# Patient Record
Sex: Male | Born: 1975 | Race: White | Hispanic: No | Marital: Single | Smoking: Current every day smoker
Health system: Southern US, Community
[De-identification: ages and names within clinical notes are randomized; demographics above are authoritative.]

## PROBLEM LIST (undated history)

## (undated) DIAGNOSIS — F259 Schizoaffective disorder, unspecified: Secondary | ICD-10-CM

## (undated) DIAGNOSIS — IMO0002 Reserved for concepts with insufficient information to code with codable children: Secondary | ICD-10-CM

## (undated) DIAGNOSIS — J45909 Unspecified asthma, uncomplicated: Secondary | ICD-10-CM

## (undated) DIAGNOSIS — F319 Bipolar disorder, unspecified: Secondary | ICD-10-CM

## (undated) DIAGNOSIS — M549 Dorsalgia, unspecified: Secondary | ICD-10-CM

## (undated) DIAGNOSIS — F909 Attention-deficit hyperactivity disorder, unspecified type: Secondary | ICD-10-CM

## (undated) HISTORY — PX: BACK SURGERY: SHX140

## (undated) HISTORY — PX: ANKLE SURGERY: SHX546

## (undated) HISTORY — DX: Unspecified asthma, uncomplicated: J45.909

## (undated) HISTORY — DX: Reserved for concepts with insufficient information to code with codable children: IMO0002

---

## 1994-06-02 ENCOUNTER — Other Ambulatory Visit: Payer: Self-pay

## 2005-02-17 ENCOUNTER — Inpatient Hospital Stay: Admission: EM | Admit: 2005-02-17 | Attending: Surgical Critical Care | Admitting: Surgical Critical Care
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
## 2012-09-20 ENCOUNTER — Emergency Department: Admit: 2012-09-20 | Attending: Emergency Medicine | Admitting: Emergency Medicine

## 2012-09-20 DIAGNOSIS — M25561 Pain in right knee: Secondary | ICD-10-CM | POA: Insufficient documentation

## 2012-09-20 MED ORDER — ACETAMINOPHEN 300 MG-CODEINE 30 MG TABLET
1.0000 | ORAL_TABLET | Freq: Once | ORAL | Status: AC
Start: 2012-09-21 — End: 2012-09-20
  Administered 2012-09-20: 1 via ORAL
  Filled 2012-09-20: qty 1

## 2012-09-20 MED ORDER — ACETAMINOPHEN 300 MG-CODEINE 30 MG TABLET
1.0000 | ORAL_TABLET | Freq: Four times a day (QID) | ORAL | Status: AC | PRN
Start: 2012-09-20 — End: 2012-10-20

## 2012-09-20 MED ORDER — ACETAMINOPHEN 300 MG-CODEINE 30 MG TABLET
1.0000 | ORAL_TABLET | Freq: Once | ORAL | Status: AC
Start: 2012-09-20 — End: 2012-09-20
  Administered 2012-09-20: 2 via ORAL
  Filled 2012-09-20: qty 2

## 2012-09-20 MED ORDER — ACETAMINOPHEN 300 MG-CODEINE 30 MG TABLET
1.0000 | ORAL_TABLET | Freq: Once | ORAL | Status: DC
Start: 2012-09-20 — End: 2012-09-20
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
## 2012-09-20 NOTE — ED Nursing Note (Signed)
Dr. Barnes @ bedside.

## 2012-09-20 NOTE — ED Nursing Note (Signed)
Patient discharged from ED with AVS, Rx, related instructions and all belongings. Patient is in NAD, is awake/alert skin, is pink/warm/dry. Pt leaves the ED in stable condition by self; has no car or bike so will be walking.
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
## 2012-09-21 ENCOUNTER — Encounter: Payer: Self-pay | Admitting: Emergency Medicine

## 2012-09-23 ENCOUNTER — Emergency Department: Admit: 2012-09-23 | Attending: Emergency Medicine | Admitting: Emergency Medicine

## 2012-09-23 NOTE — ED Triage Note (Signed)
Pt ambulatory to triage, reports was seen here last week for same R knee pain, x ray taken, states now with increasing stabbing pain and pressure with ambulation. Now with Fevers/chills at home, +low abd pain, +n/v, LBM 48 hrs, denies stool changes. "i think i have an infection". R knee warm to touch, no erythema or swelling noted.

## 2012-09-23 NOTE — ED Nursing Note (Signed)
Pt frequently coming to triage desk. Reporting his pain is worsening and demanding pain meds. Educated on hospital policy. Pt disgruntled at wait time. Delays explained.

## 2012-09-23 NOTE — ED Nursing Note (Signed)
Pt ambulates to triage, continues to complain of pain to right knee. Reports emesis x3. Pt also reports abd pain. Pt breathing comfortably. Skin warm/dry

## 2012-09-23 NOTE — ED Nursing Note (Signed)
Ambulates to triage. Reports no change in status. Breathing comfortably, skin warm/dry

## 2012-09-23 NOTE — ED Progress Note (Signed)
Emergency Department Triage Note     Subjective: Carl Mata is a 37yr old male who presents to the ED with a chief complaint of 2 things.  R knee pain, ongoing   Ap-new, while taking codeine.     Patient's phone number confirmed -      Objective (pertinent exam findings):    BP 121/68  Pulse 81  Temp(Src) 36.7 C (98.1 F) (Oral)  Resp 16  Wt 68.04 kg (150 lb)  BMI 22.81 kg/m2  SpO2 97%     General: No acute distress, but appears to be in some pain. Walks with a limp.    Assessment: Carl Mata is a 37yr old male with knee paina nd AP.Marland Kitchen      Plan: Initial studies to evaluate this problem will include labs.    Further workup will then proceed in patient care area c/d. ?  Seen and initially evaluated by:  Ermelinda Das, MD, Attending Physician, Department of Emergency Medicine       This patient was seen by a physician in triage. Please see the ED Initial Note for full details of this patient's care. This note covers the brief initial evaluation performed to obtain initial studies to expedite the patient's care. It is not intended to be a comprehensive document of the patient's ED visit.    This patient was instructed that this preliminary assessment and any studies obtained do not constitute a full workup of the patient's chief complaint. The patient was instructed to wait in the assigned area after the triage evaluation to be reevaluated by a physician after initial studies are completed.

## 2012-09-23 NOTE — ED Nursing Note (Signed)
Ambulates to triage. Denies change in abd pain. Breathing comfortably. Skin warm/dry

## 2012-09-24 ENCOUNTER — Encounter: Payer: Self-pay | Admitting: General Practice

## 2012-09-24 DIAGNOSIS — R109 Unspecified abdominal pain: Secondary | ICD-10-CM | POA: Insufficient documentation

## 2012-09-24 MED ORDER — DOCUSATE SODIUM 100 MG CAPSULE
100.0000 mg | ORAL_CAPSULE | Freq: Two times a day (BID) | ORAL | Status: AC
Start: 2012-09-24 — End: 2012-10-24

## 2012-09-24 MED ORDER — NACL 0.9% IV BOLUS - DURATION REQ
1000.0000 mL | Freq: Once | INTRAVENOUS | Status: AC
Start: 2012-09-24 — End: 2012-09-24
  Administered 2012-09-24: 1000 mL via INTRAVENOUS

## 2012-09-24 MED ORDER — HYDROCODONE 5 MG-ACETAMINOPHEN 325 MG TABLET
1.0000 | ORAL_TABLET | Freq: Three times a day (TID) | ORAL | Status: AC | PRN
Start: 2012-09-24 — End: 2012-10-08

## 2012-09-24 MED ORDER — MAGNESIUM CITRATE ORAL SOLUTION
296.0000 mL | Freq: Once | ORAL | Status: AC
Start: 2012-09-24 — End: 2012-09-24
  Administered 2012-09-24: 296 mL via ORAL

## 2012-09-24 MED ORDER — MORPHINE 2 MG/ML INJECTION SYRINGE
4.0000 mg | INJECTION | INTRAMUSCULAR | Status: DC | PRN
Start: 2012-09-24 — End: 2012-09-24
  Administered 2012-09-24 (×2): 4 mg via INTRAVENOUS
  Filled 2012-09-24 (×2): qty 1

## 2012-09-24 MED ORDER — ONDANSETRON HCL (PF) 4 MG/2 ML INJECTION SOLUTION
4.0000 mg | Freq: Once | INTRAMUSCULAR | Status: AC
Start: 2012-09-24 — End: 2012-09-24
  Administered 2012-09-24: 4 mg via INTRAVENOUS
  Filled 2012-09-24: qty 2

## 2012-09-24 NOTE — ED Nursing Note (Signed)
Pt reports that he wants pain medication, pt states "the doctor promised me pain medication, nausea medication is not pain medication, I want pain medication" MD made aware, pt agitated

## 2012-09-24 NOTE — ED Nursing Note (Signed)
Pt given po juice and crackers verbalized understanding on instructions per MD pt given pain meds before d/c, pt ambulatory out of ED

## 2012-09-24 NOTE — ED Nursing Note (Signed)
Pt reports LLQ pain X3 days "getting worse and worse feels like im walking on glass" reports nausea and vomiting green bile, constipation since 09/20/12, bilatral leg pain and all over body pain, reports that he does not do any recreational drugs but he took unknown pills from his friends to make him feel better, pt ambulatory to room pending Md evaluation

## 2012-09-24 NOTE — ED Nursing Note (Addendum)
sts pain so bad having to wear sun glasses, describes abd pain as "scratching together on glass" - endorses nausea - gcs 15, skin tones pwd, talking full sentences - lab results reviewed

## 2012-09-24 NOTE — Discharge Instructions (Signed)
Please contact one of the below mediCal clinic to schedule appointment with primary care provider.                        [facility] Referral List  [facility] Groups, Hours 8:00 am to 5:00 pm    [employer], [address], [place], [place] [address]  [phone]    [facility], [address] [place], [place] [address]    [place] [facility], [address] [place], [place] [address] [phone]    [facility], [address], [address], [phone]    [facility], [address], [address]  [phone], [address], [address]  [phone]    [address], [address] Cindie Laroche [place], [place] [address]  [phone]    [facility], [place]. Wed. Fri. 5:00 pm to 8:00 pm, Sat & Sun 10:00am - 4:00pm    [facility], [address], [place], [place] [address]  [phone]     [address], [place], [place] [address]  [phone]    [facility], [address], [place], [place] [address]  [address], [address], [place], [place] [address]  [phone]    [facility], [address] [place] [address]  [phone]    [facility], [address] [place], [place] [address]  [phone]    [facility], [address] [place], [place] [address]  [phone]    [facility], [address], [place], [place] [address]  [phone]    Planned [address], [place], [place]  [phone]    [place], [address] [place], [place]  [phone]    [place], [address] [place], [place]  [phone]    [facility], [address], [place], [place]  [phone]    [place], [address], [place], [place]  [phone]    24 hour facts of life line  [phone]    Additional [facility], [address] [place], [place]  [phone]    [facility], [address] Kathlene Cote [place], [place]  [phone] (7 days a  week)    [employer] [address] [place], [place]  [phone] (5pm - 8pm, M-F)    [facility], [address] [place], [place]  [phone]    [facility], [address] [place] [place]  [phone]    [facility], [address] [place], [place]  [phone]    [facility], [address] [place], [place]  [phone]     [facility], [address], [place], [place]  [phone]    [facility], [address] [place], [place]  [phone]    [facility], [address] [place], [place], [place]  [phone]    Med Center, [address], [place], [place]  [phone]    [facility], [address], [place], [place]  [phone]    [facility], [address], [place], [place]  [phone]    [facility], [address] [place], [place]  [phone]    [facility], [facility], [place], [place]  [phone]    [facility], [address] [place], [place]   [phone]    [facility],  [facility] [address] [place], [place]     [place], [address] [place], [place]    [facility], [address] [place], [place], [address] [place], [place]    [place], [place] [address]  [place] [address] [place], [place]  or  [place]. [place], [place]  or  [place], [place]  [phone]  or  [place]    [place], [place]  [address]  [place], [place]  [phone]  [address] # [place], [place]  ([facility], [facility] for [employer], [address], [place], [place]    [facility], [place], [place], [place]    [facility], [place]., [place], [place]    [facility], [place], [place], [place] for [facility], [place]. [facility], [address] [place], [place], [address] [place], [place], [address]    [place], [place]. #[address] [phone]     [facility], [facility], [place], [place], [place]

## 2012-09-24 NOTE — ED Initial Note (Signed)
EMERGENCY DEPARTMENT PHYSICIAN NOTE - Carl Mata       Date of Service:   09/23/2012  1:10 PM Patient's PCP: No Pcp No Pcp   Note Started: 09/24/2012 03:03 DOB: Aug 19, 1975             Chief Complaint   Patient presents with    Abd Pain Active     with R knee pain. LLQ           The history provided by the patient and medical records.  Interpreter used: No    Carl Mata is a 37yr old male, with a past medical history significant for recent right knee sprain requiring Rx for tylenol w/ codeine just 3 days prior to presentation, who presents to the ED with a chief complaint of LLQ abdominal pain radiating to the left flank associated with urinary symptoms and N/V.  Last BM 3 days ago.  Able to tolerate liquids PO.    Quality: stabbing  Location: LLQ with radiation tot he left flank  Severity: 9-10/10 (does not correlate to exam, pt appears comfortable)  Time Course: worsening  Progression: rapidly worsening  Duration: 2 days  Palliative factors: laying down makes it better.  Provocative factors: bending, twisting, eating and standing makes it worse.  Associated symptoms: generalized aches and pain more pronounced in the lower extremities, dizziness, light sensitivity, dysuria, hesitancy, and straining for urination.  Pertinent negatives: Denies fevers, headache, or cough.    A full history, including pertinent past medical, family and social history was reviewed.    HISTORY:  There are no hospital problems to display for this patient.   Allergies   Allergen Reactions    Nsaids (Non-Steroidal Anti-Inflammatory Drug) Swelling      Past Medical History:    Unspecified mental or behavioral problem                      Unspecified asthma(493.90)                                 History reviewed. No pertinent surgical history.   Social History    Marital Status: SINGLE              Spouse Name:                       Years of Education:                 Number of children:               Occupational History    None on  file    Social History Main Topics    Smoking Status: Current Every Day Smoker        Packs/Day: 0.20  Years: 1.5       Types: Cigarettes    Smokeless Status: Not on file                       Alcohol Use: No              Drug Use: Yes                Special: Methamphetamine    Sexual Activity: Not on file          Other Topics            Concern  None on file    Social History Narrative    None on file     No family history on file.             Review of Systems   Constitutional: Positive for fatigue. Negative for fever.   HENT: Negative.    Eyes: Positive for photophobia. Negative for visual disturbance.   Respiratory: Negative.    Cardiovascular: Negative.    Gastrointestinal: Positive for nausea, vomiting, abdominal pain and constipation. Negative for diarrhea, blood in stool and abdominal distention.   Genitourinary: Positive for dysuria and flank pain. Negative for frequency and hematuria.   Musculoskeletal: Positive for myalgias and arthralgias.   Skin: Negative for rash.   Neurological: Positive for dizziness. Negative for tremors, seizures, syncope, weakness, light-headedness, numbness and headaches.   Psychiatric/Behavioral: Negative.    All other systems reviewed and are negative.        TRIAGE VITAL SIGNS:  Temp: 36.7 C (98.1 F) (09/23/12 1306)  Temp src: Oral (09/23/12 1306)  Pulse: 81  (09/23/12 1306)  BP: 121/68 mmHg (09/23/12 1306)  Resp: 16  (09/23/12 1306)  SpO2: 97 % (09/23/12 1306)  Weight: 68.04 kg (150 lb) (09/23/12 1318)    Physical Exam   Nursing note and vitals reviewed.  Constitutional: He is oriented to person, place, and time. He appears well-developed and well-nourished. No distress.   HENT:   Head: Normocephalic and atraumatic.   Eyes: Conjunctivae and EOM are normal.   Pupils dilated to 5mm, poorly reactive to light   Cardiovascular: Normal rate, regular rhythm, normal heart sounds and intact distal pulses.    No murmur heard.  Pulmonary/Chest: Effort normal and breath sounds  normal. No respiratory distress. He has no wheezes. He has no rales. He exhibits no tenderness.   Abdominal: Soft. He exhibits no distension. Bowel sounds are decreased. There is tenderness in the right lower quadrant, suprapubic area and left lower quadrant. There is CVA tenderness. There is no guarding, no tenderness at McBurney's point and negative Murphy's sign.   Point tender in left flank muscles at iliac crest with reproduction of pain.   Neurological: He is alert and oriented to person, place, and time.   Skin: Skin is warm and dry. He is not diaphoretic.   Psychiatric: He has a normal mood and affect. His behavior is normal.           INITIAL ASSESSMENT & PLAN, MEDICAL DECISION MAKING, ED COURSE:  Carl Mata is a 37yr old male who presents with a chief complaint of LLQ abdominal pain radiating to the left flank associated with urinary symptoms and N/V.   After history and exam, I feel the diagnosis is most likely abd wall pain.  Differential diagnosis includes, but is not limited to Gastroenteritis, Nephrolithiasis, s/e of amphetamine use.    The patient is hemodynamically stable and will require IV fluids as an immediate intervention.   Initial treatment and studies to evaluate this problem will include serial exams, labs, imaging, analgesia as needed and antiemetics as needed.       ED Course Update: The patient was observed in the ED. The results of the ED evaluation were notable for the following:    Pertinent lab results:   Normal CBC, BMP, INR, & CRP   Ketones and protein on UA, otherwise normal   Amphetamines and Opiates found on UDT    Pertinent imaging results:   Abdominal X-rays: Nonobstructive bowel gas pattern, with mild volume retained  fecal material throughout the colon.      Pertinent medications:    Morphine IV, given for pain control.   Mag Citrate, given for constipation.   Zofran, given for Nausea.    Interventions:    - Morphine 4mg  IV x1   - Mag Citrate PO x1   - Zofran 4mg  IV  x1    ED Course:  Serial exams revealed no progression or worsening of abdominal pain.  Abs X-rays demonstrate significant stool burden. Also the patient's presentation is also consistent with abdominal wall muscle strain.  Pain improved with pain medication.    Patient Summary:   37 yo male who is taking opiates and amphetamines presents with acute LLQ abdominal pain associated with bending/twisting movements and constipation x3 days.  Likely acute constipation vs abdominal muscle wall strain.  Pain has improved with IV pain meds.  Patient is able to tolerate PO without significant N/V.  Will d/c home with PO pain meds (Norco #20) and stool softener.  Pt instructed to f/u with [facility] PCP within the next fes weeks.      LAST VITAL SIGNS:  Temp: 36.5 C (97.7 F) (09/24/12 0252)  Temp src: Oral (09/24/12 0252)  Pulse: 70  (09/24/12 0252)  BP: 123/78 mmHg (09/24/12 0252)  Resp: 18  (09/24/12 0252)  SpO2: 98 % (09/24/12 0252)  Weight: 68.04 kg (150 lb) (09/23/12 1318)      Clinical Impression:  Abdominal Pain      Anticipated work up needed: Outpatient work-up with [facility] PCP.      Disposition: Discharge. Follow up with [facility] PCP. ED discharge instructions were reviewed and provided. It was discussed that radiology reports are preliminary and the patient will be contacted for any changes in interpretation.        MDM COMPLEXITY  Overall Complexity of MDM is: moderate        PRESENT ON ADMISSION:  Are any of the following four conditions present or suspected on admission: decubitus ulcer, infection from an intravascular device, infection due to an indwelling catheter, surgical site infection or pneumonia? No.    PATIENT'S GENERAL CONDITION:  Good: Vital signs are stable and within normal limits. Patient is conscious and comfortable. Indicators are excellent.       Report electronically signed by:  Amador Cunas, MD Intern    Report electronically signed by: Melene Plan Mickie Kay, MD  Attending Physician    This  patient was seen, evaluated, and care plan was developed with the resident.  I agree with the findings and plan as outlined in our combined note.      Romona Curls, MD

## 2012-10-05 ENCOUNTER — Emergency Department: Admit: 2012-10-05 | Attending: Emergency Medicine | Admitting: Emergency Medicine

## 2012-10-05 NOTE — ED Triage Note (Signed)
Patient reports can't take deep breaths, dizzy, productive cough x 2 weeks, right knee pain. Resp even and unlabored, lungs cta, shallow unable to take deep breaths consistently without coughing. +runny nose.

## 2012-10-05 NOTE — ED Nursing Note (Signed)
Pt ambulatory with steady gait. Pt with cont cough. Denies productive cough. Cont chronic knee pain. Denies acute changes. Pt ambulating to xray.

## 2012-10-06 ENCOUNTER — Encounter: Payer: Self-pay | Admitting: Diagnostic Radiology

## 2012-10-06 DIAGNOSIS — R059 Cough, unspecified: Secondary | ICD-10-CM | POA: Insufficient documentation

## 2012-10-06 NOTE — ED Nursing Note (Signed)
Pt refused vital signs.

## 2012-10-06 NOTE — ED Initial Note (Signed)
EMERGENCY DEPARTMENT PHYSICIAN NOTE - Carl Mata       Date of Service:   10/05/2012  7:13 PM Patient's PCP: No Pcp No Pcp   Note Started: 10/06/2012 00:59 DOB: 1975-09-06             Chief Complaint   Patient presents with    Cough       The history provided by the patient.  Interpreter used: No    Carl Mata is a 37yr old male, with a past medical history significant for, who presents to the ED with a chief complaint of non-productive cough that began two weeks ago and some mild throat pain worsened with coughing. Minimal improvement with tylenol. Nothing seems to make the symptoms worse.  He had recent congestion as well that has resolved. No fever/chills, no ear pain.    A full history, including pertinent past medical, family and social history was reviewed.    HISTORY:  There are no hospital problems to display for this patient.   Allergies   Allergen Reactions    Nsaids (Non-Steroidal Anti-Inflammatory Drug) Swelling      Past Medical History:    Unspecified mental or behavioral problem                      Unspecified asthma(493.90)                                 No past surgical history on file.   Social History    Marital Status: SINGLE              Spouse Name:                       Years of Education:                 Number of children:               Occupational History    None on file    Social History Main Topics    Smoking Status: Current Every Day Smoker        Packs/Day: 0.20  Years: 1.5       Types: Cigarettes    Smokeless Status: Not on file                       Alcohol Use: No              Drug Use: Yes                Special: Methamphetamine    Sexual Activity: Not on file          Other Topics            Concern    None on file    Social History Narrative    None on file     No family history on file.             Review of Systems   Constitutional: Negative for fever and chills.   HENT: Positive for congestion (Resolved 1 week ago) and sore throat. Negative for ear pain.    Eyes:  Negative for discharge and itching.   Respiratory: Positive for cough. Negative for chest tightness, shortness of breath and wheezing.    Gastrointestinal: Negative for nausea, vomiting, abdominal pain, diarrhea and constipation.   Musculoskeletal:  Positive for arthralgias (Pain in right knee and right ankle (history of post-traumatic surgery)).   All other systems reviewed and are negative.        TRIAGE VITAL SIGNS:  Temp: 37.9 C (100.2 F) (10/05/12 1913)  Temp src: Oral (10/05/12 1913)  Pulse: 104  (10/05/12 1913)  BP: 132/78 mmHg (10/05/12 1913)  Resp: 20  (10/05/12 1913)  SpO2: 98 % (10/05/12 1913)  Weight: (not recorded)    Physical Exam   Constitutional: He appears well-developed and well-nourished.   HENT:   Head: Normocephalic and atraumatic.   Mouth/Throat: Oropharynx is clear and moist. No oropharyngeal exudate.   Eyes: EOM are normal. Pupils are equal, round, and reactive to light. Right eye exhibits no discharge. Left eye exhibits no discharge.   Neck: Normal range of motion. Neck supple. No thyromegaly present.   Cardiovascular: Normal rate, regular rhythm, normal heart sounds and intact distal pulses.  Exam reveals no gallop and no friction rub.    No murmur heard.  Pulmonary/Chest: Effort normal and breath sounds normal. No respiratory distress. He has no wheezes. He has no rales.   Abdominal: Soft. Bowel sounds are normal. He exhibits no distension. There is no tenderness.   Musculoskeletal: Normal range of motion.   Post surgical scar overlying anterior and posterior right knee and medial right ankle.  Inconsistent endorsement of pain of right knee and ankle joint with flexion/extension.   Lymphadenopathy:     He has no cervical adenopathy.   Skin: Skin is warm and dry. No rash noted. No erythema.       INITIAL ASSESSMENT & PLAN, MEDICAL DECISION MAKING, ED COURSE:  Carl Mata is a 37yr old male who presents with a chief complaint of cough. After history and exam, I feel the diagnosis is most  likely post-viral reactive cough. Pneumonia is unlikely without systemic symptoms, benign pulmonary exam and normal chest radiograph.  Differential diagnosis also includes, but is not limited to seasonal allergies and GERD.  The patient is hemodynamically stable and will require no immediate intervention. Initial treatment and studies to evaluate this problem will include analgesia as needed.        Radiology - I reviewed and interpreted the images independently  CXR - no infiltrate, ptx      ED Course: Patient's chest x-ray did not display findings concerning of pneumonia or other acute process.    Patient Summary: 37 yo male with cough x 2 weeks in setting of recent URI.  This likely represents a resolving post viral cough.  Supportive care discussed for home - fluids, rest, OTC antitussives as needed.      LAST VITAL SIGNS:  Temp: 36.7 C (98.1 F) (10/05/12 2148)  Temp src: Oral (10/05/12 2148)  Pulse: 91  (10/05/12 2148)  BP: 113/61 mmHg (10/05/12 2148)  Resp: 20  (10/05/12 2148)  SpO2: 98 % (10/05/12 2148)  Weight: (not recorded)      Clinical Impression: Post-viral reactive cough      Anticipated work up needed: none      Disposition: Discharge. Follow up with PCP. ED discharge instructions were reviewed and provided. It was discussed that radiology reports are preliminary and the patient will be contacted for any changes in interpretation.        MDM COMPLEXITY  Overall Complexity of MDM is: low          PRESENT ON ADMISSION:  Are any of the following four conditions present or suspected on admission: decubitus ulcer,  infection from an intravascular device, infection due to an indwelling catheter, surgical site infection or pneumonia? No.    PATIENT'S GENERAL CONDITION:  Good: Vital signs are stable and within normal limits. Patient is conscious and comfortable. Indicators are excellent.       Report electronically signed by:  Janit Pagan, MD Intern      The patient was seen and examined with the intern  after we discussed patient management and key exam findings.  I performed a complete history and independent exam on the patient.  The note above reflects my medical decision making.      Electronically signed:    Ina Kick, MD, [facility]  Attending Physician, Department of Emergency Medicine  PI [phone]

## 2012-10-31 ENCOUNTER — Other Ambulatory Visit: Payer: Self-pay

## 2012-10-31 ENCOUNTER — Emergency Department: Admit: 2012-10-31 | Payer: MEDICAID

## 2012-10-31 DIAGNOSIS — IMO0002 Reserved for concepts with insufficient information to code with codable children: Secondary | ICD-10-CM | POA: Insufficient documentation

## 2012-10-31 DIAGNOSIS — T148XXA Other injury of unspecified body region, initial encounter: Secondary | ICD-10-CM | POA: Insufficient documentation

## 2012-10-31 MED ORDER — HYDROCODONE 5 MG-ACETAMINOPHEN 325 MG TABLET
1.0000 | ORAL_TABLET | Freq: Four times a day (QID) | ORAL | 0 refills | Status: AC | PRN
Start: 2012-10-31 — End: 2012-11-07
  Filled 2012-10-31 (×2): qty 20, 5d supply, fill #0

## 2012-10-31 MED ORDER — MORPHINE 2 MG/ML INJECTION SYRINGE
4.0000 mg | INJECTION | Freq: Once | INTRAMUSCULAR | Status: DC
Start: 2012-10-31 — End: 2012-10-31

## 2012-10-31 MED ORDER — HYDROCODONE 5 MG-ACETAMINOPHEN 325 MG TABLET
1.0000 | ORAL_TABLET | Freq: Once | ORAL | Status: AC
Start: 2012-10-31 — End: 2012-10-31
  Administered 2012-10-31: 2 via ORAL
  Filled 2012-10-31: qty 2

## 2012-10-31 MED ORDER — MORPHINE 2 MG/ML INJECTION SYRINGE
4.0000 mg | INJECTION | INTRAMUSCULAR | Status: DC | PRN
Start: 2012-10-31 — End: 2012-10-31
  Administered 2012-10-31 (×2): 4 mg via INTRAVENOUS
  Filled 2012-10-31 (×2): qty 1
  Filled 2012-10-31: qty 2

## 2012-10-31 MED ORDER — NACL 0.9% IV BOLUS - DURATION REQ
1000.0000 mL | Freq: Once | INTRAVENOUS | Status: AC
Start: 2012-10-31 — End: 2012-10-31
  Administered 2012-10-31: 1000 mL via INTRAVENOUS

## 2012-10-31 NOTE — ED Nursing Note (Signed)
Assumed care of pt.   Reports 7 foot fall of ladder.   No loc.   See trauma survey for assessment details.  Awake and alert, skin warm and dry.  To xray

## 2012-10-31 NOTE — ED Initial Note (Signed)
EMERGENCY DEPARTMENT PHYSICIAN NOTE - JOHNPATRICK JENNY       Date of Service:   10/31/2012 12:27 PM Patient's PCP: No Pcp No Pcp   Note Started: 10/31/2012 13:04 DOB: Dec 13, 1975             Chief Complaint   Patient presents with    *933:Blunt/Critical Trauma Level III           The history provided by the patient.  Interpreter used: No    TEMESGEN WEIGHTMAN is a 37yr old male, with a past medical history significant for asthma, who presents to the ED s/p fall from ladder today.  Patient states he fell 7 ft, landed on back.  C/o neck, back, right abdomen and right knee pain.  No LOC.  Denies SOB or severe HA.    A full history, including pertinent past medical, family and social history was reviewed.    HISTORY:  There are no hospital problems to display for this patient.   Allergies   Allergen Reactions    Nsaids (Non-Steroidal Anti-Inflammatory Drug) Swelling      Past Medical History:    Unspecified mental or behavioral problem                      Unspecified asthma(493.90)                                 No past surgical history on file.   Social History    Marital Status: MARRIED             Spouse Name:                       Years of Education:                 Number of children:               Occupational History    None on file    Social History Main Topics    Smoking Status: Current Every Day Smoker        Packs/Day: 0.20  Years: 1.5       Types: Cigarettes    Smokeless Status: Not on file                       Alcohol Use: No              Drug Use: Yes                Special: Methamphetamine    Sexual Activity: Not on file          Other Topics            Concern    None on file    Social History Narrative    None on file     No family history on file.             Review of Systems   Constitutional: Negative for diaphoresis.   HENT: Positive for neck pain.    Respiratory: Negative for shortness of breath.    Cardiovascular: Positive for chest pain.   Gastrointestinal: Positive for abdominal pain. Negative for  nausea, vomiting and diarrhea.   Genitourinary: Negative for flank pain.   Musculoskeletal: Positive for back pain and arthralgias.   Skin: Negative for color change and pallor.   Neurological:  Negative for headaches.   Psychiatric/Behavioral: Negative for confusion.   All other systems reviewed and are negative.        TRIAGE VITAL SIGNS:  Temp: 36.1 C (97 F) (10/31/12 1229)  Temp src: Oral (10/31/12 1229)  Pulse: 91  (10/31/12 1229)  BP: 131/80 mmHg (10/31/12 1229)  Resp: 16  (10/31/12 1229)  SpO2: 98 % (10/31/12 1229)  Weight: 68.04 kg (150 lb) (10/31/12 1231)    Physical Exam   Nursing note and vitals reviewed.  Constitutional: He is oriented to person, place, and time. He appears well-developed and well-nourished. No distress.   HENT:   Head: Normocephalic and atraumatic.   Right Ear: External ear normal.   Left Ear: External ear normal.   Nose: Nose normal.   Mouth/Throat: Oropharynx is clear and moist.   Eyes: Conjunctivae and EOM are normal. Pupils are equal, round, and reactive to light.   Neck:   C-collar in place. +c-spine TTP. No stepoffs.     Cardiovascular: Normal rate, regular rhythm, normal heart sounds and intact distal pulses.  Exam reveals no gallop and no friction rub.    No murmur heard.  Pulmonary/Chest: Effort normal and breath sounds normal. No respiratory distress. He has no wheezes. He has no rales. He exhibits tenderness (anterior CW TTP).   Abdominal: Soft. He exhibits no distension. There is tenderness (right-sided TTP). There is no rebound and no guarding.   Musculoskeletal:   Pelvis stable. +T/L spine TTP. No stepoffs. Good gluteal squeeze.  Right medial knee TTP, able to flex to 90 degrees.  Distal sensation, strength and pulses intact       Neurological: He is alert and oriented to person, place, and time. No cranial nerve deficit. He exhibits normal muscle tone. Coordination normal.   Skin: Skin is warm and dry. He is not diaphoretic. No erythema. No pallor.   Psychiatric: He has  a normal mood and affect. His behavior is normal.           INITIAL ASSESSMENT & PLAN, MEDICAL DECISION MAKING, ED COURSE:  KAIRE STARY is a 37yr old male who presents with a chief complaint of fall.  Differential diagnosis includes, but is not limited to fractures, contusion, IAI, ICH.  The patient is hemodynamically stable and will require IVF as an immediate intervention. Initial treatment and studies to evaluate this problem will include serial exams, labs, imaging and analgesia as needed.         ED Course Update: The patient was observed in the ED. The results of the ED evaluation were notable for the following:    Pertinent lab results (reviewed and interpreted independently by me): normal CBC, chem7, LFTs, lipase    Pertinent imaging results (reviewed and interpreted independently by me):   CT C-spine: no fracture or dislocation  CT A/P: no acute process  CT L-spine: no acute process  CXR: no acute process  T-spine xray: no fracture or dislocation  Right knee xray: no fracture or dislocation    Pertinent medications: morphine, given for pain.    Chart Review: I reviewed the patient's prior medical records. Pertinent information that is relevant to this encounter h/o asthma.          Further MDM  Additional data reviewed? N/A    Patient Summary: 37 year-old man presenting to ED s/p fall today.  Patient remains afebrile and hemodynamically stable in ED.  Lab studies unremarkable.  Xrays and CT scans of neck, back, right knee and abdomen  without acute process.  Patient given IV analgesia with improved pain.  Patient able to ambulate and tolerate PO in ED.  Plan to discharge home with rx for norco.  Discussed return precautions, including but not limited to chest pain, shortness of breath, vomiting, fevers, worsening pain.  Patient verbalizes understanding and agrees to follow-up with primary care doctor in 1-2 days.            LAST VITAL SIGNS:  Temp: 36.1 C (97 F) (10/31/12 1229)  Temp src: Oral (10/31/12  1229)  Pulse: 88  (10/31/12 1427)  BP: 110/65 mmHg (10/31/12 1427)  Resp: 20  (10/31/12 1427)  SpO2: 97 % (10/31/12 1427)  Weight: 68.04 kg (150 lb) (10/31/12 1231)      Clinical Impression:   Fall  Contusions   Back sprain      Anticipated work up needed: f/u with PCP      Disposition: Discharge. Follow up with PCP. ED discharge instructions were reviewed and provided. It was discussed that radiology reports are preliminary and the patient will be contacted for any changes in interpretation.        MDM COMPLEXITY  Overall Complexity of MDM is: high          PRESENT ON ADMISSION:  Are any of the following four conditions present or suspected on admission: decubitus ulcer, infection from an intravascular device, infection due to an indwelling catheter, surgical site infection or pneumonia? No.    PATIENT'S GENERAL CONDITION:  Fair: Vital signs are stable and within normal limits. Patient is conscious but may be uncomfortable. Indicators are favorable.      Report electronically signed by:  Margie Ege, MD Resident      This patient was seen, evaluated, and care plan was developed with the resident.  I agree with the findings and plan as outlined in our combined note.        Kandice Moos, MD  Assistant Professor of Emergency Medicine  Department of Emergency Medicine  Division of Medical Toxicology  Pager: [phone]

## 2012-10-31 NOTE — ED Nursing Note (Signed)
DC instructions given by resident.   Pt A and O times four, skin PWD, positive csm times four, ambulates with steady gait.  Takes po with no n/v.  DC home with family

## 2012-10-31 NOTE — Consults (Signed)
933 trauma code activation   Patient seen and evaluated by the trauma service within one hour of presentation. We will follow the patient's ED course closely.  When appropriate, we will be ready to admit the patient. Thank you.     Laural Golden ACNP  [phone]  10/31/2012  12:41

## 2012-10-31 NOTE — ED Triage Note (Signed)
Pt aox4. Reports on ladder, fell off apprx 46ft. Landed on back. -loc. +cspine/tspine pain. Reports tingling to ble. Pt reports ruq abd pain with palpation. Denies cp/sob. Skin warm/dry.

## 2012-11-02 MED FILL — iohexoL 350 mg iodine/mL intravenous solution: INTRAVENOUS | Qty: 125 | Status: AC

## 2012-11-28 ENCOUNTER — Emergency Department: Admit: 2012-11-28 | Payer: MEDICAID | Attending: Emergency Medicine | Admitting: Emergency Medicine

## 2012-11-28 DIAGNOSIS — S5000XA Contusion of unspecified elbow, initial encounter: Secondary | ICD-10-CM | POA: Insufficient documentation

## 2012-11-28 MED ORDER — HYDROCODONE 10 MG-ACETAMINOPHEN 325 MG TABLET
1.0000 | ORAL_TABLET | Freq: Once | ORAL | Status: AC
Start: 2012-11-28 — End: 2012-11-28
  Administered 2012-11-28: 1 via ORAL
  Filled 2012-11-28: qty 1

## 2012-11-28 NOTE — ED Initial Note (Signed)
EMERGENCY DEPARTMENT PHYSICIAN NOTE - Carl Mata       Date of Service:   11/28/2012  2:48 PM Patient's PCP: No Pcp No Pcp   Note Started: 11/28/2012 16:53 DOB: [birth year]             Chief Complaint   Patient presents with    Elbow Injury     engine fell onto R elbow at approx 2pm, decreased ROM           The history provided by the patient.  Interpreter used: No    Carl Mata is a LHD 37yr old male, with no significant past medical history, who presents to the ED with a chief complaint of RUE pain that began at 14:00 this pm. Patient states he was carrying an engine when it fell on his RUE. Patient states it was immediately swollen but it has reduced. Endorses tingling to fingers of R-hand. Denies any other physical complaint at this time.    Quality: Aching  Location: R-elbow  Severity: 7/10  Time Course: constant  Progression: unchanged  Duration: since 14:00 this pm  Palliative factors: nothing makes it better.  Provocative factors: movement and touch makes it worse.    A full history, including pertinent past medical, family and social history was reviewed.    HISTORY:  There are no hospital problems to display for this patient.   Allergies   Allergen Reactions    Nsaids (Non-Steroidal Anti-Inflammatory Drug) Swelling      Past Medical History:    Unspecified mental or behavioral problem                      Unspecified asthma(493.90)                                 No past surgical history on file.   Social History    Marital Status: SINGLE              Spouse Name:                       Years of Education:                 Number of children:               Occupational History    None on file    Social History Main Topics    Smoking Status: Current Every Day Smoker        Packs/Day: 0.20  Years: 1.5       Types: Cigarettes    Smokeless Status: Not on file                       Alcohol Use: No              Drug Use: Yes                Special: Methamphetamine    Sexual Activity: Not on file           Other Topics            Concern    None on file    Social History Narrative    None on file     No family history on file.             Review of Systems  Musculoskeletal: Positive for arthralgias.   All other systems reviewed and are negative.        TRIAGE VITAL SIGNS:  Temp: 36.6 C (97.9 F) (11/28/12 1448)  Temp src: Oral (11/28/12 1448)  Pulse: 80  (11/28/12 1448)  BP: 125/66 mmHg (11/28/12 1448)  Resp: 18  (11/28/12 1448)  SpO2: 98 % (11/28/12 1448)  Weight: (not recorded)    Physical Exam   Nursing note and vitals reviewed.  Constitutional: He is oriented to person, place, and time. He appears well-developed and well-nourished. No distress.   HENT:   Head: Normocephalic and atraumatic.   Cardiovascular: Normal rate, regular rhythm and normal heart sounds.  Exam reveals no gallop and no friction rub.    No murmur heard.  Pulmonary/Chest: Effort normal and breath sounds normal. No respiratory distress. He has no wheezes. He has no rales.   Musculoskeletal:   RUE:  No bony deformity or TTP of R-shoulder.  TTP diffusley throughout R-elbow without bony deformity or swelling. Good supination and pronation.  No pain with palpation of R-forearm or wrist.  2+ radial pulse.  Median, radial and ulnar nerves intact.  Motor and sensation are    Neurological: He is alert and oriented to person, place, and time. No cranial nerve deficit.   Skin: Skin is warm and dry. He is not diaphoretic.   Psychiatric: He has a normal mood and affect. His behavior is normal.     INITIAL ASSESSMENT & PLAN, MEDICAL DECISION MAKING, ED COURSE:  Carl Mata is a 37yr old male who presents with a chief complaint of RUE. After history and exam, I feel the diagnosis is most likely contusion. Differential diagnosis includes, but is not limited to fx/dislocation.  The patient is hemodynamically stable and will require imaging and pain control as immediate interventions. Initial treatment and studies to evaluate this problem will include  serial exams, imaging and analgesia as needed.         ED Course Update: The patient was observed in the ED. The results of the ED evaluation were notable for the following:    Pertinent imaging results (reviewed and interpreted independently by me):   ELBOW 3+ VIEWS, RIGHT: negative for fracture or dislocation    Pertinent medications: Norco 10 PO, given for pain.    Further MDM  Additional data reviewed? N/A    ED Course:   16:52 Patient was seen and evaluated.  17:16 Patient was given Norco 10 PO for pain.     Patient Summary: Carl Mata is a 64yr male with no significant past medical history, who presents to the ED with the following problem list: right elbow pain after dropping a car engine on it this afternoon.  My physical examination of the patient was notable for:  Full range of motion of the right shoulder, elbow, wrist (with pain during range of motion of the elbow), no neurovascular deficit, no appreciable swelling or bony deformity.  Based on this presentation, I have considered the following differential diagnosis:  Right elbow contusion, versus occult fracture or dislocation.  After considering the patient's history, physical exam, and workup performed in the ED for this visit, I believe this presentation to be most consistent with contusion.  With regards to the other diagnoses considered, I have a low suspicion for  fracture or dislocation based on the patient's exam and unremarkable imaging.                LAST VITAL SIGNS:  Temp:  36.6 C (97.9 F) (11/28/12 1448)  Temp src: Oral (11/28/12 1448)  Pulse: 80  (11/28/12 1448)  BP: 125/66 mmHg (11/28/12 1448)  Resp: 18  (11/28/12 1448)  SpO2: 98 % (11/28/12 1448)  Weight: (not recorded)      Clinical Impression: Right elbow contusion      Anticipated work up needed: none      Disposition: Discharge. Follow up with PCP. ED discharge instructions were reviewed and provided. It was discussed that radiology reports are preliminary and the patient will  be contacted for any changes in interpretation.        MDM COMPLEXITY  Overall Complexity of MDM is: low          PRESENT ON ADMISSION:  Are any of the following four conditions present or suspected on admission: decubitus ulcer, infection from an intravascular device, infection due to an indwelling catheter, surgical site infection or pneumonia? No.    PATIENT'S GENERAL CONDITION:  Fair: Vital signs are stable and within normal limits. Patient is conscious but may be uncomfortable. Indicators are favorable.    DISCLAIMER:   This document serves as my personal record of services taken in my presence. It was created on 11/28/2012 on my behalf by Kelli Churn, a trained medical scribe.     I have reviewed this document and agree that this note accurately reflects the history and exam findings, the patient care provided, and my medical decision making.    11/28/2012  Electronically Signed By: William Hamburger, MD  Attending Physician, Department of Emergency Medicine  [facility]

## 2012-11-28 NOTE — ED Nursing Note (Signed)
Pt upset, DC'd home, unable to obtain last VS, AAOx4, no PIV, escorted to DC window

## 2012-11-28 NOTE — Discharge Instructions (Signed)
[facility] (Pregnancy and Infants), [phone]    [facility]  Health,vision,dental,BH for undocumented Children  [phone]    [facility], [address]  [phone] / [phone] 8am-5pm    Mon - Fri    [facility], Screen for uninsured Children [phone]    [facility], [address] [phone]    [facility], [address]  [phone] 8am-5pm Mon-Th, 9:45am-5pm Fri  [phone]    [facility], [address]  [phone]  7:45am - 5pm Mon - Fri, NO Medi Cal    Health Programs for the Uninsured  Medi Cal and Healthy Families, [phone]    Major Risk Medical Insurance Program  Preexisting condition and denied coverage  [phone]    [facility], [address]  [phone] / [phone], 8am - 5pm Mon - Fri  No Primary Care, Family Planning and STD    [facility]  (Children's primary care)  [address]  am - 5pm Mon - Fri, 9am - 12noon Sat    [facility], [address]  [address], [phone], 8am - 9pm    [facility], [address]  [phone], 8am - 5pm  Mon - Fri

## 2012-11-28 NOTE — ED Triage Note (Signed)
R elbow pain s/p car engine falling onto R elbow. Decreased ROM due to pain, slight swelling. VSS. +pulses, able to wiggle fingers, reports some tingling to fingers.

## 2012-12-06 ENCOUNTER — Encounter: Payer: Self-pay | Admitting: Emergency Medicine

## 2012-12-06 ENCOUNTER — Inpatient Hospital Stay
Admission: EM | Admit: 2012-12-06 | Discharge: 2012-12-08 | DRG: 552 | Disposition: A | Discharge status: Home / Self Care | Payer: MEDICAID | Attending: Vascular Surgery | Admitting: Vascular Surgery

## 2012-12-06 DIAGNOSIS — W11XXXA Fall on and from ladder, initial encounter: Secondary | ICD-10-CM | POA: Diagnosis present

## 2012-12-06 DIAGNOSIS — F17200 Nicotine dependence, unspecified, uncomplicated: Secondary | ICD-10-CM | POA: Diagnosis present

## 2012-12-06 DIAGNOSIS — Z888 Allergy status to other drugs, medicaments and biological substances status: Secondary | ICD-10-CM | POA: Insufficient documentation

## 2012-12-06 DIAGNOSIS — S5000XA Contusion of unspecified elbow, initial encounter: Secondary | ICD-10-CM | POA: Diagnosis present

## 2012-12-06 DIAGNOSIS — F319 Bipolar disorder, unspecified: Secondary | ICD-10-CM | POA: Diagnosis present

## 2012-12-06 DIAGNOSIS — Y92009 Unspecified place in unspecified non-institutional (private) residence as the place of occurrence of the external cause: Secondary | ICD-10-CM | POA: Insufficient documentation

## 2012-12-06 DIAGNOSIS — Y93H9 Activity, other involving exterior property and land maintenance, building and construction: Secondary | ICD-10-CM | POA: Insufficient documentation

## 2012-12-06 DIAGNOSIS — S22009A Unspecified fracture of unspecified thoracic vertebra, initial encounter for closed fracture: Principal | ICD-10-CM | POA: Diagnosis present

## 2012-12-06 DIAGNOSIS — J45909 Unspecified asthma, uncomplicated: Secondary | ICD-10-CM | POA: Diagnosis present

## 2012-12-06 MED ORDER — FENTANYL (PF) 50 MCG/ML INJECTION SOLUTION
INTRAMUSCULAR | Status: AC
Start: 2012-12-06 — End: 2012-12-06
  Filled 2012-12-06: qty 2

## 2012-12-06 MED ORDER — FENTANYL (PF) 50 MCG/ML INJECTION SOLUTION
50.0000 ug | INTRAMUSCULAR | Status: DC | PRN
Start: 2012-12-06 — End: 2012-12-07
  Administered 2012-12-06 (×2): 50 ug via INTRAVENOUS
  Filled 2012-12-06 (×2): qty 2

## 2012-12-06 MED ORDER — FENTANYL (PF) 50 MCG/ML INJECTION SOLUTION
75.0000 ug | Freq: Once | INTRAMUSCULAR | Status: AC
Start: 2012-12-06 — End: 2012-12-06
  Administered 2012-12-06: 75 ug via INTRAVENOUS

## 2012-12-06 NOTE — ED Initial Note (Signed)
EMERGENCY DEPARTMENT PHYSICIAN NOTE - WRAY GOEHRING       Date of Service:   12/06/2012  8:55 PM Patient's PCP: No Pcp No Pcp   Note Started: 12/06/2012 21:27 DOB: 10-29-1975             Chief Complaint   Patient presents with    Back Pain Acute     The history provided by the patient.  Interpreter used: No    SANFORD LINDBLAD is a 37yr old male, with a past medical history significant for asthma, who presents to the ED with a chief complaint of lumbar pain that began 20 minutes ago.  Patient states he was on a latter lifting a a/c unit, board he was standing on gave way and he fell directly down on to his bottom.  He did not hit is head, have LOC.  The a/c did not strike him.  Denies C or T spine pain.  Denies shortness of breath, chest pain or abdominal pain.       A full history, including pertinent past medical, family and social history was reviewed.    HISTORY:  There are no hospital problems to display for this patient.   Allergies   Allergen Reactions    Nsaids (Non-Steroidal Anti-Inflammatory Drug) Swelling      Past Medical History:    Unspecified mental or behavioral problem                      Unspecified asthma(493.90)                                 History reviewed. No pertinent surgical history.   Social History    Marital Status: SINGLE              Spouse Name:                       Years of Education:                 Number of children:               Occupational History    None on file    Social History Main Topics    Smoking Status: Current Every Day Smoker        Packs/Day: 0.20  Years: 1.5       Types: Cigarettes    Smokeless Status: Not on file                       Alcohol Use: No              Drug Use: Yes                Special: Methamphetamine    Sexual Activity: Not on file          Other Topics            Concern    None on file    Social History Narrative    None on file     No family history on file.       Review of Systems   Constitutional: Negative.    HENT: Negative.     Respiratory: Negative.    Cardiovascular: Negative.    Gastrointestinal: Negative.    Genitourinary: Negative.    Musculoskeletal: Positive for back pain.   Skin:  Negative.    Neurological: Negative.    Psychiatric/Behavioral: Negative.    All other systems reviewed and are negative.        TRIAGE VITAL SIGNS:  Temp: 36.4 C (97.5 F) (12/06/12 2047)  Temp src: Oral (12/06/12 2047)  Pulse: 86 (12/06/12 2047)  BP: 125/82 mmHg (12/06/12 2047)  Resp: 20 (12/06/12 2047)  SpO2: 95 % (12/06/12 2047)  Weight: 68.493 kg (151 lb) (12/06/12 2047)    Physical Exam   Nursing note and vitals reviewed.  Constitutional: He is oriented to person, place, and time. He appears well-developed and well-nourished. No distress.   HENT:   Head: Normocephalic and atraumatic.   No hemotympanum  No racoon's eyes  No battle signs     Eyes: Conjunctivae and EOM are normal. Pupils are equal, round, and reactive to light.   Neck: Normal range of motion. Neck supple.   Cardiovascular: Normal rate, normal heart sounds and intact distal pulses.  Exam reveals no gallop and no friction rub.    No murmur heard.  Pulmonary/Chest: Effort normal and breath sounds normal. No stridor. No respiratory distress. He has no wheezes.   Abdominal: Soft. Bowel sounds are normal. He exhibits no distension. There is no tenderness.   Musculoskeletal: Normal range of motion. He exhibits tenderness. He exhibits no edema.   No C or T spine tenderness or step offs  + midline high and low L spine tenderness, no step offs  No pelvic pain  No internal/external hip pain   Neurological: He is alert and oriented to person, place, and time. He displays normal reflexes. No cranial nerve deficit. He exhibits normal muscle tone. Coordination normal.   Skin: Skin is warm and dry. No rash noted. He is not diaphoretic. No erythema. No pallor.       INITIAL ASSESSMENT & PLAN, MEDICAL DECISION MAKING, ED COURSE:  MARC LEICHTER is a 37yr old male who presents with a chief complaint of  Lumbar spine pain. After history and exam, I feel the diagnosis is most likely L spine compression fracture. Differential diagnosis includes, but is not limited to contusion.  The patient is hemodynamically stable and will require exam as an immediate intervention. Initial treatment and studies to evaluate this problem will include serial exams, labs, imaging, analgesia as needed and antiemetics as needed.     ED Course Update: The patient was observed in the ED. The results of the ED evaluation were notable for the following:    Pertinent lab results (reviewed and interpreted independently by me):   Labs Reviewed   BASIC METABOLIC PANEL - Abnormal; Notable for the following:     UREA NITROGEN, BLOOD (BUN) 23 (*)     All other components within normal limits   CBC AUTO + REFLEX MANUAL DIFF   URINALYSIS-COMPLETE       Pertinent imaging results (reviewed and interpreted independently by me):   L-SPINE 4+ VIEWS -- T12 compression fx (new compared to May 2014)  PELVIS 1 OR 2 VIEWS -- no fx or dislocations    Pertinent medications: fentanyl, given for pain.    Chart Review: I reviewed the patient's prior medical records. Pertinent information that is relevant to this encounter Fall one month ago with similar story.    LAST VITAL SIGNS:  Temp: 36.5 C (97.7 F) (12/06/12 2322)  Temp src: Oral (12/06/12 2322)  Pulse: 52 (12/07/12 0526)  BP: 107/71 mmHg (12/07/12 0526)  Resp: 19 (12/07/12 0526)  SpO2: 99 % (12/07/12 0526)  Weight: 68.493 kg (151 lb) (12/06/12 2047)    Clinical Impression: Trauma - fall. Contusion to back. T12 fracture.    Disposition: Admit. Anticipate the patient will require greater than 2 nights of admission due to fx.    MDM COMPLEXITY  Overall Complexity of MDM is: moderate    The patient consented to treatment and was fastened to a cardiac monitor. Intravenous line was established. Cardiac rhythm and pulse oximetry was monitored. The patient's EMR was thoroughly reviewed. Throughout the patient's  emergency department course, repeated bedside assessments were made (focused exams and repeat histories), vital signs monitored , labs and imaging reviewed, assessed for response to therapy assessed, and management was discussed with ED housestaff, consultants, and nursing. The findings of the diagnostic studies performed during this visit and treatment plans were discussed in detail with the patient or their designated representative whenever possible.     Serious spinal fracture - Admission and Neurosurgical consultation.    PRESENT ON ADMISSION:  Are any of the following four conditions present or suspected on admission: decubitus ulcer, infection from an intravascular device, infection due to an indwelling catheter, surgical site infection or pneumonia? No.    PATIENT'S GENERAL CONDITION:  Fair: Vital signs are stable and within normal limits. Patient is conscious but may be uncomfortable. Indicators are favorable.    Report electronically signed by:  Maree Erie, MD Resident    This patient was seen, evaluated, and care plan was developed with the resident.  I agree with the findings and plan as outlined in our combined note.      Leatrice Jewels, MD    Report electronically signed by: Leatrice Jewels, MD, Attending.

## 2012-12-06 NOTE — ED Nursing Note (Signed)
c-spine cleared clinically by Dr. Orene Desanctis

## 2012-12-06 NOTE — ED Triage Note (Signed)
Pt. BIBA from home - pt. Fall from ladder 5-7 feet to grassy ground area onto butt - pt. Denies LOC or head trauma - pt. Reports 10/10 right lower back pains and denies numbness or tingling to BLE - pt. +CSM X4 extremities and moves X4 extremities without issues - pt. Non-amb on scene - pt. abd soft and non-TTP and chest non-TTP - breath sounds clear bilaterally - pt. In full spine precautions with c-collar in place - pt. To hallway -

## 2012-12-06 NOTE — ED Nursing Note (Signed)
Dr. Eulah Pont removed backboard, c-collar remains in place. Pt has tenderness to low L-spine, no step- off, pelvic is stable. Lungs cta, No chest wall tenderness, abd non tender. Pt report fixing aircondition unit while on ladder when he fell directly on his tailbone. Denies loc. Denies numbness or tingling to lower extremities. V.O from Dr. Eulah Pont to give 75 mcg of fentaly via IV. Pt is AOx4 and talking in complete sentences.

## 2012-12-07 ENCOUNTER — Emergency Department (HOSPITAL_COMMUNITY): Payer: MEDICAID

## 2012-12-07 ENCOUNTER — Inpatient Hospital Stay (HOSPITAL_COMMUNITY): Payer: MEDICAID

## 2012-12-07 DIAGNOSIS — S22080A Wedge compression fracture of T11-T12 vertebra, initial encounter for closed fracture: Secondary | ICD-10-CM | POA: Insufficient documentation

## 2012-12-07 DIAGNOSIS — W19XXXA Unspecified fall, initial encounter: Secondary | ICD-10-CM | POA: Insufficient documentation

## 2012-12-07 MED ORDER — DOCUSATE SODIUM 100 MG CAPSULE
100.0000 mg | ORAL_CAPSULE | Freq: Two times a day (BID) | ORAL | Status: DC
Start: 2012-12-07 — End: 2012-12-08
  Administered 2012-12-07 – 2012-12-08 (×2): 100 mg via ORAL
  Filled 2012-12-07 (×2): qty 1

## 2012-12-07 MED ORDER — HYDROCODONE 10 MG-ACETAMINOPHEN 325 MG TABLET
1.0000 | ORAL_TABLET | ORAL | Status: DC | PRN
Start: 2012-12-07 — End: 2012-12-08

## 2012-12-07 MED ORDER — DIAZEPAM 5 MG/ML INJECTION SYRINGE
5.0000 mg | INJECTION | Freq: Once | INTRAMUSCULAR | Status: AC
Start: 2012-12-07 — End: 2012-12-07
  Administered 2012-12-07: 5 mg via INTRAVENOUS
  Filled 2012-12-07: qty 2

## 2012-12-07 MED ORDER — MORPHINE 2 MG/ML INJECTION SYRINGE
1.0000 mg | INJECTION | INTRAMUSCULAR | Status: DC | PRN
Start: 2012-12-07 — End: 2012-12-07
  Administered 2012-12-07 (×2): 4 mg via INTRAVENOUS
  Filled 2012-12-07 (×2): qty 1

## 2012-12-07 MED ORDER — MORPHINE 2 MG/ML INJECTION SYRINGE
2.0000 mg | INJECTION | Freq: Four times a day (QID) | INTRAMUSCULAR | Status: DC | PRN
Start: 2012-12-07 — End: 2012-12-07

## 2012-12-07 MED ORDER — ONDANSETRON HCL (PF) 4 MG/2 ML INJECTION SOLUTION
4.0000 mg | Freq: Two times a day (BID) | INTRAMUSCULAR | Status: DC | PRN
Start: 2012-12-07 — End: 2012-12-08

## 2012-12-07 MED ORDER — HYDROMORPHONE 1 MG/ML INJECTION SYRINGE
2.0000 mg | INJECTION | INTRAMUSCULAR | Status: DC | PRN
Start: 2012-12-07 — End: 2012-12-07
  Administered 2012-12-07 (×2): 1 mg via INTRAVENOUS
  Administered 2012-12-07 (×3): 2 mg via INTRAVENOUS
  Administered 2012-12-07: 1 mg via INTRAVENOUS
  Filled 2012-12-07 (×7): qty 1

## 2012-12-07 MED ORDER — DIPHENHYDRAMINE 50 MG/ML INJECTION SOLUTION
12.5000 mg | Freq: Four times a day (QID) | INTRAMUSCULAR | Status: DC | PRN
Start: 2012-12-07 — End: 2012-12-08

## 2012-12-07 MED ORDER — CYCLOBENZAPRINE 10 MG TABLET
5.0000 mg | ORAL_TABLET | Freq: Three times a day (TID) | ORAL | Status: DC | PRN
Start: 2012-12-07 — End: 2012-12-08
  Administered 2012-12-07 – 2012-12-08 (×3): 10 mg via ORAL
  Filled 2012-12-07 (×3): qty 1

## 2012-12-07 MED ORDER — HYDROMORPHONE 1 MG/ML INJECTION SYRINGE
0.5000 mg | INJECTION | Freq: Once | INTRAMUSCULAR | Status: AC
Start: 2012-12-07 — End: 2012-12-07
  Administered 2012-12-07: 1 mg via INTRAVENOUS

## 2012-12-07 MED ORDER — HYDROMORPHONE 1 MG/ML INJECTION SYRINGE
1.0000 mg | INJECTION | INTRAMUSCULAR | Status: DC | PRN
Start: 2012-12-07 — End: 2012-12-07
  Administered 2012-12-07: 1 mg via INTRAVENOUS
  Filled 2012-12-07 (×7): qty 1

## 2012-12-07 MED ORDER — OXYCODONE 5 MG TABLET
5.0000 mg | ORAL_TABLET | ORAL | Status: DC | PRN
Start: 2012-12-07 — End: 2012-12-08
  Administered 2012-12-07 – 2012-12-08 (×6): 5 mg via ORAL
  Filled 2012-12-07 (×6): qty 1

## 2012-12-07 MED ORDER — HYDROCODONE 5 MG-ACETAMINOPHEN 325 MG TABLET
1.0000 | ORAL_TABLET | ORAL | Status: DC | PRN
Start: 2012-12-07 — End: 2012-12-07
  Administered 2012-12-07 (×2): 2 via ORAL
  Filled 2012-12-07 (×2): qty 2

## 2012-12-07 MED FILL — iohexoL 350 mg iodine/mL intravenous solution: INTRAVENOUS | Qty: 125 | Status: AC

## 2012-12-07 NOTE — ED Nursing Note (Signed)
Pt with c/o 10/10 pain to his lower back. States nonradiating. Whimpering. C/o increased pain with soft palpation to the generalized abd. Aware MD ordered stronger additional pain meds. Denies numbness, tingling. Denies n/v, dizziness or other c/o.

## 2012-12-07 NOTE — ED Nursing Note (Signed)
Reported off to Fleet Contras F.,RN to assume pt care

## 2012-12-07 NOTE — ED Nursing Note (Signed)
Received report and assumed care of pt from [facility]

## 2012-12-07 NOTE — Nurse Assessment (Signed)
TRANSFER NOTE - RECEIVING    Note Started: 12/07/2012, 16:56     Patient received at 1511 hours from er unit by gurney. Pt condition stable . patient and family oriented to room and unit. MD notified of patient's arrival on unit.  Plan of care reviewed and updated. Collier Flowers, RN RN

## 2012-12-07 NOTE — ED Nursing Note (Signed)
Assumed pt care, pt asleep, easily awakens, c/o severe low back pain when awake, beligerent and angry when informed he is not due for pain meds for another 25 min. Dr. Eulah Pont informed.

## 2012-12-07 NOTE — ED Nursing Note (Signed)
Assume patient care- patient restless complaining of lower back pain. See mar for action.Reports normal sensation to bilateral legs.  GCS 15. Awaiting for plan of care from Ortho.

## 2012-12-07 NOTE — Consults (Addendum)
NSG Resident Brief Note  12/07/2012     Rectal tone intact  No saddle anesthesia, anal sensation intact to pin prick.   Sensation in extremities and trunk grossly intact to LT    No changes to prior recommendations. TLSO brace, pain/spasm control. Follow up in [facility] spine clinic in 4-6 weeks if pain persists.     Lissa Hoard, M.D. PGY2  [place]  NSG Svc Pager: [phone]  Direct Pager [phone]  PI# [phone]      Off the shelf brace for 3 months adequate.  No need for f/u unless pain increases.

## 2012-12-07 NOTE — Nurse Assessment (Signed)
ASSESSMENT NOTE    Note Started: 12/07/2012, 21:19     Initial assessment completed and recorded in EMR @ 1958.  Report received from day shift nurse and orders reviewed. Plan of Care reviewed and appropriate, discussed with patient.  Will continue with patient's plan of care and continue to monitor. Najae Rathert Danford Bad, RN

## 2012-12-07 NOTE — Allied Health Progress (Signed)
Orthotics/Prosthetics Inpatient    Progress Note:  Mr. Carl Mata was seen today in the [facility] Department # C-3 for fitting for an [employer] as requested by his physician.   I fit this patient with a size large TLSO with 12" Contour back panel and small" Sternal Bar.  I discussed with patient the donning and doffing, skincare precautions, and wearing protocol as needed.  I advised patient to use a T-shirt first before donning the orthosis to act as a protective interfaces to protect the skin.  Patient was instructed not to sleep with the orthosis.  Patient states that pain status is reduced with the orthosis.  Will continue to follow patient as needed.  Patient has no questions at this time.  I provided written instructions and left at bedside.  Informed patient's nurse of pain status.  If there are any questions or concerns please contact Orthotics at [phone].    Pain Status: 10/10    ICD9#...................805.2    PI #.....................[id number]    Treating Physician....Marland KitchenMarland KitchenDr. William Hamburger    Education:     Handout: yes  Verbal Instruction: yes    Jenell Milliner, ORTHOTIST  # [phone]  Dept. Of PM&R  [phone]  Beeper [phone]

## 2012-12-07 NOTE — ED Nursing Note (Signed)
Pt anxious, removed C-collar,explained to pt why he needs to wear it, pt does not want to, will medicate pt to try to keep pt comfortable

## 2012-12-07 NOTE — Progress Notes (Signed)
PI # [id number]  Trauma and Emergency Surgery      Trauma Surgery Progress Note: Cspine clearance.    CT cspine final read negative    Exam:  No tenderness noted with movement to right               No tenderness noted with movement to left               No tenderness noted with flexion               No tenderness noted with extension     Cspine cleared / collar remove.     Everlee Quakenbush, ACNP-C  Trauma and Emergency Surgery  PI#[id number] Pgr: [phone]

## 2012-12-07 NOTE — ED Nursing Note (Signed)
Report to Angie RN, care transferred.

## 2012-12-07 NOTE — ED Nursing Note (Signed)
Please see paper charting for downtime documentation.

## 2012-12-07 NOTE — Consults (Addendum)
DEPARTMENT OF NEUROLOGICAL SURGERY  SPINE CONSULTATION    Consulting Service:  ED    Date of Admission:   12/06/2012  8:55 PM Date of Service: 12/07/2012    Note Started: 12/07/2012, 00:03 Name of Requesting Attending:          REASON FOR CONSULTATION:  T12 compression fx    HISTORY OF PRESENT ILLNESS:  Carl Mata with PMH significant of asthma s/p fall from height approx 82ft when he was fixing an AC unit on a ladder, and landed on his buttocks with immediate pain in his lower back. On plain film demonstrated findings indicating T12 compression fx. Pt had prior fall in May 14 without injury on [facility] studies.  Denies urinary or bowel incontinence. Denies focal motor weakness, or new onset of tingling of numbness in all extremities except for LBP pain.     HISTORY:  Patient Active Problem List    Diagnosis Date Noted    Elbow contusion 11/28/2012    Fall 10/31/2012    Contusion 10/31/2012    Back sprain 10/31/2012    Cough 10/06/2012    Abdominal pain 09/24/2012    Right medial knee pain 09/20/2012    Allergies   Allergen Reactions    Nsaids (Non-Steroidal Anti-Inflammatory Drug) Swelling      Past Medical History   Diagnosis Date    Unspecified mental or behavioral problem      bipolar    Unspecified asthma(493.90)      No past surgical history on file.   (Not in a hospital admission)   Social History     Occupational History    Not on file.     Social History Main Topics    Smoking status: Current Every Day Smoker -- 0.20 packs/day for 1.5 years     Types: Cigarettes    Smokeless tobacco: Not on file    Alcohol Use: No    Drug Use: Yes     Special: Methamphetamine    Sexually Active: Not on file    No family history on file.     There is no immunization history on file for this patient.    The patient's past medical, family, and social history was reviewed and confirmed.    ROS:  Negative except as documented in HPI    VITAL SIGNS:  Vital Signs (Last Recorded):  Temp: 36.5 C (97.7 F) (12/06/12  2322)  Temp src: Oral  Pulse: 81   BP: 117/83 mmHg  Resp: 16   SpO2: 97 %     Weight: 68.493 kg (151 lb)  There is no height on file to calculate BSA.  Body mass index is 22.96 kg/(m^2).    PHYSICAL EXAM:   Neuro Exam:   GCS: 15  Pupils: OD size: 3mm, reaction: brisk             OS size: 3 mm, reaction: brisk  Cranial Nerves: 2-12 grossly intact    Motor:    Deltoid Biceps Triceps Wrist Flexion Wrist Extension Hand Intrinsics   Right 5 5 5 5 5 5    Left 5 5 5 5 5 5        Hip Flexion Knee Extension Knee Flexion Dorsiflexion Great Toe Extension Plantar Flexion   Right 5 5 5 5 5 5    Left 5 5 5 5 5 5      Reflexes:   Biceps Triceps Brachio-radialis Patellar Achilles Plantar Flexor Response   Right +2 +2 +2 +2 +1 down  Left +2 +2 +2 +2 +1 down             Rectal deferred          Hoffman-Troemnor: negative          Pectoralis: negative  Sensory: intact to LT    Imaging:   Plain film TL spine acute loss of height at T12 suggesting compression fx post trauma  CT TL spine: T12 burst fx with some retropulsion into post. Spinal canal without frank ventral compression  Cord. Osseous fragment alone L3 superior endplate visualized on prior scan, normal variant per final read prior.     LAB TESTS / STUDIES:  I personally reviewed the following:  images, labs.    Latest CMP  Recent labs for the past 48 hours     12/06/12 2129    SODIUM 137    POTASSIUM 4.3    CHLORIDE 102    CARBON DIOXIDE TOTAL 28    UREA NITROGEN, BLOOD (BUN) 23*    CREATININE BLOOD 0.91    E-GFR, AFRICAN AMERICAN &gt;60    E-GFR, NON-AFRICAN AMERICAN &gt;60    GLUCOSE 92    CALCIUM 9.5    PROTHROMBIN TIME --    ALBUMIN --    ALKALINE PHOSPHATASE (ALP) --    ALANINE TRANSFERASE (ALT) --     Latest CBC  Recent labs for the past 48 hours     12/06/12 2129    WHITE BLOOD CELL COUNT 7.2    HEMOGLOBIN 15.7    HEMATOCRIT 45.4    PLATELET COUNT 267     Latest INR / APTT    No results found for this basename: INR,APTT in the last 48 hours  Tox screen:  NA  Blood alcohol: NA    ASSESSMENT: 37yr old Mata s/p fall from height with acute T12 burst fx with mild retropulsion of fragment into posterior spinal canal without frank cord impingement from ventral aspect on CT images. . Neurologically intact without focal deficit but tender to palpation along T spine at corresponding level.     RECOMMENDATIONS:     No neurosurgical intervention indicated for now.  Pain/spasm control.  Serial neuro exam.  Consider MR spine to rule out ligamentous injury of TL spine.    Surgical vs expectant management with TLSO brace and follow up for 3-5 months. Indications for neurosurgical intervention in burst fx include: ant. vert. body height < or equal 50% of the post. height. Residual canal less or equal to 50% of normal. KYphotic angulation more than or equal to 20 degrees. Presence of neurological deficits. Progressive kyphosis or disruption of post. Complexes.     Will dw staff and team in AM for specific recommendation    Lissa Hoard, M.D. PGY2  [facility]  NSG Svc Pager: [phone]  Direct Pager [phone]  PI# [phone]      This patient was seen, evaluated, and care plan was developed with the resident.  I agree with the assessment and plan as outlined in the resident's note.  I personally reviewed the image and he has stable burst fx,AO Classification A3 and TLICS likely 2(posterior ligamentous complex likely intact but not certain since no MRI) .  No surgery needed.  MAy manage in off the shelf TLSO.  Will sign off.    Report electronically signed by Fernande Bras, MD. Attending

## 2012-12-07 NOTE — H&P (Addendum)
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 No incontinence of urine or stool.    HISTORY OF PRESENT ILLNESS  Mechanism of injury:  fall  Location:  body part injured: lower back, moderate  Duration of symptoms (time): since last night    Context    Seatbelted: N/A  Loss of consciousness: no  Hypotension prior to arrival:  No  Helmeted: N/A    Mode of transport:  Ambulance    PAST MEDICAL HISTORY  PMHx:   Past Medical History   Diagnosis Date    Unspecified mental or behavioral problem      bipolar    Unspecified asthma(493.90)        PSHx: No past surgical history on file.    Allergies:    Nsaids (Non-Steroidal Anti-Inflammatory Drug)    Swelling    Medications:   Prior to Admission Medications   Medication Last Dose Informant Patient Reported? Taking?   ALBUTEROL INHA   Yes No          FH: Non-contributory    SH:   Tobacco: occasional  Alcohol: denies  Drugs: denies    The patient's past medical, family, and social history was reviewed and confirmed.    ROS  Obtained -    General:  Negative  Eyes:  Negative  Ear, Nose and Throat:  Negative  Respiratory:  negative  Cardiovascular:  Negative  Gastrointestinal:  Negative  Genito-urinary:  Negative  Musculoskeletal:  Back Pain  SKIN:  negative  All other ROS negative.    VITAL SIGNS  Current:  Temp: 36.4 C (97.5 F)  BP: 120/69 mmHg  Pulse: 70  Resp: 18  SpO2: 96 %      Weight: 68.493 kg (151 lb)  There is no height on file to calculate BSA.  Body mass index is 22.96 kg/(m^2).    Last Recorded:  Temp: 36.4 C (97.5 F) (12/07/12 0724)   Temp src: Oral (12/07/12 0724)  Pulse: 70 (12/07/12  0724)   BP: 120/69 mmHg  Resp: 18  SpO2: 96 %        Weight: 68.493 kg (151 lb)  Body mass index is 22.96 kg/(m^2).  There is no height on file to calculate BSA.    GLASGOW COMA SCALE  Eye Opening: Spontaneous - 4  Verbal Response: Oriented - 5  Motor Response: Obeys Command - 6  Total: 15    PHYSICAL EXAM  Head:No evidence of trauma  Eyes: No evidence of trauma, PERRL and EOMI  Ear, Nose, Throat: No Facial Injury and No Dental Injury  Neck:Non-tender without stepoffs  Respiratory: Equal Breath Sounds and Chest wall non tender  Cardiovascular: Regular Rate and Rythm  Abdominal: Non-tender  Genital Exam: Genital Exam Normal  Rectal Exam: good gluteal tone  Back: lower Tspine tenderness  Neurological: CNS Normal  Extremities: No Fractures and No Trauma  Pulses: Radial  Left  Normal  Right  Normal  Other:  LAB RESULTS    Recent labs for the past 72 hours     12/06/12 2129    HEMATOCRIT 45.4      Recent labs for the past 24 hours     12/06/12 2129    WHITE BLOOD CELL COUNT 7.2       PT/INR (24 hours):   No results found for this basename: PT:*,PTT:*,INR:* in the last 24 hours   BMP (24 hours): Recent labs for the past 24 hours     12/06/12 2129    SODIUM 137    POTASSIUM 4.3    CHLORIDE 102    CARBON DIOXIDE TOTAL 28    CREATININE BLOOD 0.91    E-GFR, AFRICAN AMERICAN &gt;60    E-GFR, NON-AFRICAN AMERICAN &gt;60    UREA NITROGEN, BLOOD (BUN) 23*    GLUCOSE 92    CALCIUM 9.5       No results found for this basename: TP:*,ALB:*,TBIL:*,ALP:*,AST:*,ALT:*,BILID:* in the last 24 hours    No results found for this basename: CAIONWB:*,CARBOXHGBART:* in the last 24 hours   OTHER:   XRAYS:   PELVIS 1 OR 2 VIEWS: no fx  L-SPINE 2 OR 3 VIEWS: no fx  CT C-spine: no fx  CT T-SPINE WITHOUT CONTRAST: T12 compression fracture  CT L-SPINE WITHOUT CONTRAST: no l spine fracture  CT ABDOMEN + CT PELVIS, WITH CONTRAST: no IAI    LAB TESTS/STUDIES  I personally reviewed the following:  As above    ASSESSMENT / PLANS  Patient  Active Hospital Problem List:  Fall (12/07/2012)    Assessment: Mechanical fall    Plan: C-spine cleared clinically in ED with negative imaging. Serial abdominal exams stable, will po challenge  T12 compression fracture (12/07/2012)    Assessment: T12 compression fracture    Plan: NSG consulted and staffed, non-op, brace, pain control, follow up as outpt. Unable to obtain MRI due to ankle bracelet. CT Cspine pending, CT L spine negative. Responding well to valium for muscle spasms. Will have PT work with patient and evaluate for discharge home    DISPO: Blue floor       PRESENT ON ADMISSION:  Are any of the following five conditions present or suspected on admission: decubitus ulcer, infection from an intravascular device, infection due to an indwelling catheter, surgical site infection or pneumonia? No.        Report Electronically Signed By: Varney Daily, NP  PI #: [phone]  Nurse Practitioner      Trauma Staff       Ward Admission Note     [facility] Team     D11 [phone])     Carl Mata       [phone]             07 December 2012             Care delivered:  I have:  (1) reviewed the patient's recored history and physical examination as recounted and recorded by the Team; (2) reviewed the relevant images and laboratory studies; (3) seen the patient with the Team; and (4) confirmed the pertinent findings.  We made the following plans.       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  Fell backwards, six feet, onto his coccyx.        Injuries:  T12 compression fracture.        Resolved problems:      Active  problems:    1.  T12 compression fracture:  Nonoperative.  In brace.  Appreciate work and follow up by [employer].    2.  Possible injury to abdomen:  Negative on examinations so far.  Will advance diet, as tolerated.    3.  Moderate risk for deep venous thrombosis; moderate risk for bleeding with anticoagulation:  No anticoagulation yet.  Recent injury; risk for thrombosis will become  minimal if we can get him mobilized.    4.  Pain control:  At this time, in substantial pain.  Will need parenteral narcotics.          Disposition:  Ward care.          Electronically signed  Shary Decamp  Attending  [id number]

## 2012-12-07 NOTE — ED Nursing Note (Signed)
Ortho tech at bedside for TLSO plecement

## 2012-12-07 NOTE — ED Nursing Note (Signed)
Patient found to have removed [place] collar- NP MAese aware. Patient will not allow nurse to replace collar. Denies pain with neck movement.  Reports 10/10 lower back pain- reports pain goes down to 5/10 following pain medication. Medicated per md order.

## 2012-12-07 NOTE — ED Nursing Note (Signed)
Pt moved to C3, report to [place], Charity fundraiser.

## 2012-12-07 NOTE — ED Nursing Note (Signed)
Pt to ct 

## 2012-12-07 NOTE — ED Nursing Note (Signed)
NSG at bedside

## 2012-12-08 MED ORDER — HYDROCODONE 10 MG-ACETAMINOPHEN 325 MG TABLET
1.0000 | ORAL_TABLET | ORAL | Status: DC | PRN
Start: 2012-12-08 — End: 2012-12-08
  Administered 2012-12-08 (×4): 2 via ORAL
  Filled 2012-12-08 (×4): qty 2

## 2012-12-08 MED ORDER — POLYETHYLENE GLYCOL 3350 17 GRAM ORAL POWDER PACKET
17.0000 g | Freq: Every day | ORAL | Status: DC
Start: 2012-12-08 — End: 2012-12-08
  Administered 2012-12-08: 17 g via ORAL
  Filled 2012-12-08: qty 1

## 2012-12-08 MED ORDER — CYCLOBENZAPRINE 10 MG TABLET
10.0000 mg | ORAL_TABLET | Freq: Three times a day (TID) | ORAL | Status: DC
Start: 2012-12-08 — End: 2012-12-08

## 2012-12-08 MED ORDER — DOCUSATE SODIUM 100 MG CAPSULE
100.0000 mg | ORAL_CAPSULE | Freq: Two times a day (BID) | ORAL | Status: AC
Start: 2012-12-08 — End: 2013-12-03

## 2012-12-08 MED ORDER — CYCLOBENZAPRINE 10 MG TABLET
10.0000 mg | ORAL_TABLET | Freq: Three times a day (TID) | ORAL | Status: AC
Start: 2012-12-08 — End: 2013-12-03

## 2012-12-08 MED ORDER — DOCUSATE SODIUM 100 MG CAPSULE
100.0000 mg | ORAL_CAPSULE | Freq: Two times a day (BID) | ORAL | Status: DC
Start: 2012-12-08 — End: 2012-12-08

## 2012-12-08 MED ORDER — HYDROCODONE 10 MG-ACETAMINOPHEN 325 MG TABLET
1.0000 | ORAL_TABLET | ORAL | Status: AC | PRN
Start: 2012-12-08 — End: 2012-12-18

## 2012-12-08 MED ORDER — CYCLOBENZAPRINE 10 MG TABLET
5.0000 mg | ORAL_TABLET | Freq: Three times a day (TID) | ORAL | Status: DC
Start: 2012-12-08 — End: 2012-12-08

## 2012-12-08 MED ORDER — OXYCODONE 5 MG TABLET
5.0000 mg | ORAL_TABLET | ORAL | Status: AC | PRN
Start: 2012-12-08 — End: 2012-12-18

## 2012-12-08 MED ORDER — SENNOSIDES 8.6 MG TABLET
2.0000 | ORAL_TABLET | Freq: Every day | ORAL | Status: DC
Start: 2012-12-08 — End: 2012-12-08

## 2012-12-08 NOTE — Progress Notes (Addendum)
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 No incontinence of urine or stool.    Injuries:    T12 Compression fx    Relevant PMH/PSH:    Asthma   Bipolar      INTERVAL HISTORY:   TLSO fitting   D/C IV morphine, PO Norco 10 and oxy 5   C spine cleared    Procedures:       Tertiary Exam: Pending   Primary: Trauma Blue Consults: None   Nutrition: Regular Diet  Enteral:  IVF:     Foley: no Lines: PIV   Activity: TLSO for OOB Allergies: Nsaids (Non-Steroidal Anti-Inflammatory Drug)   PT / OT:    ST:  DVT Proph: early ambulation   Glucose control: N/A Bowel Care:    Pain meds: Norco, oxy Antibiotics: None     Vital Signs Summary  Summary  Temp src:  [-]   Temp:  [36.4 C (97.5 F)-37 C (98.6 F)]   Pulse:  [52-92]   BP: (107-140)/(69-88)   Resp:  [12-19]   SpO2:  [95 %-99 %]     Current Vitals  Temp: 36.8 C (98.2 F) (12/08/12 0410)  BP: 126/76 mmHg (12/08/12 0410) Pulse: 72 (12/08/12 0410)  Resp: 16 (12/08/12 0410)  SpO2: 95 % (12/08/12 0410)      Weight: 68.4 kg (150 lb 12.7 oz) (12/08/12 0354)     I/O's:  Last Two Completed Shifts:  In: 854 [Oral:854]  Out: -     Current Shift:  In: 240 [Oral:240]  Out: 400 [Urine:400]      PHYSICAL EXAM:   Gen:  Lying in bed, no acute distress  HEENT: Normocephalic, atraumatic  Card: RRR, No murmurs/rubs/gallops  Pulm: CTA, Moving air well  Abd: Soft, Non-distended, Non-tender  Ext: Intact distal pulses in bilateral lower and upper extremities    Meds Scheduled   Docusate (COLACE) Capsule 100 mg, ORAL, BID      Meds PRN   Cyclobenzaprine (FLEXERIL) Tablet 5-10 mg, ORAL, Q8H PRN  DiphenhydrAMINE (BENADRYL) Injection  12.5-50 mg, IV, Q6H PRN  Hydrocodone 10 mg/Acetaminophen 325 mg (NORCO 10) Tablet 1-2 tablet, ORAL, Q4H PRN  Ondansetron (ZOFRAN) Injection 4 mg, IV, Q12H PRN  Oxycodone (ROXICODONE) Tablet 5 mg, ORAL, Q4H PRN           INTERVAL LABS    No results found for this basename: ARTPH:2,ARTPCO2:2,ARTPO2:2,ARTHCO3:2,ARTBE:2,ARTO2SAT:2 in the last 48 hours   Recent labs for the past 48 hours     12/06/12 2129    SODIUM 137    POTASSIUM 4.3    CHLORIDE 102    CARBON DIOXIDE TOTAL 28    UREA NITROGEN, BLOOD (BUN) 23*    CREATININE BLOOD 0.91    GLUCOSE 92      Recent labs for the past 48 hours     12/07/12 1030 12/06/12 2129    WHITE BLOOD CELL COUNT 10.9 7.2    HEMOGLOBIN 16.0 15.7    HEMATOCRIT 45.7 45.4    PLATELET COUNT 237 267  No results found for this basename: INR:2,PTT:2 in the last 48 hours     No results found for this basename: TP:*,ALB:*,TBIL:*,ALP:*,AST:*,ALT:*,BILID:* in the last 48 hours    Recent Imaging:   XRAYS:   PELVIS 1 OR 2 VIEWS: no fx  L-SPINE 2 OR 3 VIEWS: no fx  CT C-spine: no fx  CT T-SPINE WITHOUT CONTRAST: T12 compression fracture  CT L-SPINE WITHOUT CONTRAST: no l spine fracture  CT ABDOMEN + CT PELVIS, WITH CONTRAST: no IAI     Recent Cultures:  None    PROBLEM LIST & PLAN  Principal Problem:    Fall      Overview: C-spine cleared    Active Problems:    T12 compression fracture      Overview: Plan: NSG consulted and staffed, non-op, brace, pain control, follow up as       outpt. Unable to obtain MRI due to ankle bracelet. CT Cspine neg, CT L       spine negative. Responding well to valium for muscle spasms. Will have PT       work with patient and evaluate for discharge home        Dispo: Ward Care.  Discharge on Physical Therapy evaluation.      Gillian Shields, MD   Department of Surgery  [facility] [employer] [facility]  PI# [phone]  PAGER# [phone]      Trauma Staff       Ward Note     [facility] Team     D11 [phone]     Jsiah Menta       [phone]        [phone]             08 December 2012             Care  delivered:  I have:  (1) reviewed the patient's recored history and physical examination as recounted and recorded by the Team; (2) reviewed the relevant images and laboratory studies; (3) seen the patient with the Team; and (4) confirmed the pertinent findings.  We made the following plans.       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  Fell backwards, six feet, onto his coccyx.        Injuries:  T12 burst fracture.        Resolved problems:      Active problems:    1.  T12 burst fracture:  Nonoperative.  In brace.  Appreciate work and follow up by Neurosurgery.    2.  Possible injury to abdomen:  Ruled out.  Diet as desired.    3.  Moderate risk for deep venous thrombosis; moderate risk for bleeding with anticoagulation:  No anticoagulation yet.  Recent injury; risk for thrombosis will become minimal if we can get him mobilized.    4.  Pain control:  At this time, in substantial pain.  Will need parenteral narcotics.          Disposition:  Ward care.          Electronically signed  Shary Decamp  Attending  [id number]

## 2012-12-08 NOTE — Progress Notes (Signed)
TRAUMA TERTIARY EXAM NOTE  Note Date and Time: 12/08/2012    12:36  Date of Admission: 12/06/2012  8:55 PM    Date of Service: 12/08/12 Patient's PCP: No Pcp No Pcp    MRN: [id number]      Updated PMH: No new additions    Physical Exam    HEENT: normal    Neck: Nontender    Chest Wall: Nontender    Cardiac: normal    Lungs: Clear    Abd: Nontender     Ext: Nontender, no deformities    Skin: No lacerations or abrasions    Wounds: No wounds    Radiographic reports reviewed    Assessment/Plan:    Pain control  PT      Verl Blalock, NP    [phone]   Nurse Practitioner

## 2012-12-08 NOTE — Nurse Assessment (Signed)
ASSESSMENT NOTE    Note Started: 12/08/2012, 08:02     Initial assessment complete@0750   and recorded in EMR.  Report received from night shift nurse and orders reviewed. Plan of Care reviewed and appropriate, discussed with patient.  Collier Flowers, RN RN

## 2012-12-08 NOTE — Progress Notes (Signed)
7/2/201418:26    D: Orders received for discharge. A: All discharge instructions given to the patient and family. R: Vital signs stable. Pt. And family state understanding of all discharge instructions and have no questions or concerns. Pt. Discharged to home with family and all belongings.  Collier Flowers, RN, RN

## 2012-12-08 NOTE — Allied Health Progress (Signed)
Physical Therapy Note    Date: 12/08/2012  Time in: 230    Total Time: 25 Minutes    S: 4-5/10 pain, located in midback, RN aware     O: Patient seen for last PT treatment, including Bed mobility, donning and doffing TLSO seated EOB, tranfers and gait          A: Patient is independent in all above and has no further in-patient acute PT needs.    P: D/C PT. See Discharge Summary for additional details and final status.      Doristine Locks, PT  [place] #[id number]  Vocera [phone]

## 2012-12-08 NOTE — Allied Health Procedure (Signed)
Phase Summary - Mobility Pilot    Mobility Guidelines    Last Documented Mobility Phase:      Current Phase: Phase 2    Mobility Session Initiated?:Yes    Mobility Session Completed? Yes      Jobanny Mavis, PT (te)    PI# [id number]  Vocera [phone]

## 2012-12-08 NOTE — Allied Health Consult (Addendum)
PM & R -- ACUTE CARE SERVICE      PHYSICAL THERAPY EVALUATION     Name: Carl Mata   MRN: [id number]   Date of Service: 12/08/2012  Time In: 0840    Total Time: 30 Minutes                                                                                                                                                  INTAKE INFORMATION AND HISTORY:                       Therapy Consult(s) Ordered: Physical Therapy and Occupational Therapy  Primary Service: Trauma   Date of Admission: 12/06/2012  Date of Onset (Medicare only):  N/A   Diagnosis: T12 fracture   Language: English    Precautions:   Thor-Lum Clear with TLSO Brace when OOB         History of Present Illness/Injury (Including pertinent test results & procedures):  Excerpt from Dr.'s H&P/notes    37 y/o man, installing an a/c unit, fall back approx 6 ft, landing straight on tailbone on hard grass, unable to ambulate after fall due to pain in lower back. No incontinence of urine or stool.    Injuries:   T12 burst fx with mild retropulsion of fragment into posterior spinal canal without frank cord impingement from ventral aspect on CT images. - per NSG    Past Medical/Surgical History:  Asthma and bipolar      Social History:    History     Social History    Marital Status: SINGLE     Spouse Name: N/A     Number of Children: N/A    Years of Education: N/A     Occupational History    Not on file.     Social History Main Topics    Smoking status: Current Every Day Smoker -- 0.20 packs/day for 1.5 years     Types: Cigarettes    Smokeless tobacco: Not on file    Alcohol Use: No    Drug Use: Yes     Special: Methamphetamine    Sexually Active: Not on file     Other Topics Concern    Not on file     Social History Narrative    No narrative on file      Lives in independent living facility with ~25 other people    SUBJECTIVE EXAM:    Current Living Situation: independent living facility  Support at Time of Discharge:  "~25 other people"  Environment at  Discharge:  Single level dwelling   Environmental Barriers:  No entry steps  Assistive Devices Owned: None  Adaptive Equipment Owned: N/A  Prior Level of Function: Ambulating independently without assistive device  Independent with all ADLS  Mental Status: Alert and oriented x4  Pain Level / Chief Complaint: 5/10 pain at rest in back, no radiating symptoms. 10/10 with mobility/sitting.  States that the oral meds do nothing and needs IV meds. RN present for pain and med complaints.      OBJECTIVE EXAMINATION:    Observation: 37 yo male supine in bed, with electronic device on LT ankle.     Extremity Status: WFL except        ROM MMT Sensation Tone Coordination   RUE        LUE        RLE        LLE        Comments: patient having difficulty with proximal strength due to pain in back, however functional with mobility.     Demonstrated Functional Activities:   Key: Dep=Dependent     Max=Maximal     Mod=Moderate     Min=Minimal     CG=Contact Guard     SB=Stand-By     S=Supervised     I=Independent     NA=Not Applicable     NT=Not Tested     N=Normal     G=Good     F=Fair     P=Poor     U=Unable     FWW=Front-Wheeled Walker     AC=Axillary Crutches     SPC=Single-Point Cane     Level of Assistance Dep Max Mod Min CG SB S I NA NT Comments   Rolling        x     With HOB ~20 degrees and rail   Scooting            x    Sidelying/ Supine to Sit      x     As above    Transfer Sit to Stand          x    Transfer Bed to Chair          x    Gait            x    Up/Down Step/Stairs          x    Comments: patient refusing further mobility due to pain. Patient cooperative with rolling to don TLSO brace, however when returning to bed, patient lifting straight up and instructing therapists to remove brace immediately and did not care to roll, too much pain. Frustrated and using expletives. RN notified.       Balance:       N G F P U NA NT Comments   Sitting Balance - Static  x      SBA with UE support    Sitting Balance -  Dynamic  x      SBA with UE support    Standing Balance - Static       x    Standing Balance - Dynamic       x    Comments: none        ASSESSMENT / RECOMMENDATIONS/ EDUCATION:    Physical Therapy Assessment and Discharge Recommendations:  37 yo male admitted after fall sustaining T12 burst fx with mild retropulsion of fragment into posterior spinal canal without frank cord impingement from ventral aspect. Patient presents with pain primarily limiting his mobility from baseline. Patient will benefit from PT to improve mobility and education on use of TLSO brace. Patient prefers to place TLSO in sitting, MD team contacted for clarification. No answers received yet.  Patient currently in phase 2.     Patient / Caregiver Education Today: POC, precautions, use of TLSO    Method of Teaching: verbal     Learner: patient    Response: partially understands, needs more practice (pain and frustration)    GOALS / TREATMENT PLAN / FUNCTIONAL PROGNOSIS:  Patient's Goals: none stated, agrees with PT goals     Physical Therapy Goals:    Bed Mobility: supine ? side-lying ? sit independent without bed features    Transfers: sit ? stand without an assistive device  independent    Gait: ambulating without an assistive device  independent  x 150 ft   Patient Education: independent with directing his care for TLSO and precautions         Prognosis:  Good for goals      Treatment Plan:    Bed Mobility Training  Transfer Training  Progressive Gait Training  Endurance Training  Patient Education  Discharge Planning  Durable Medical Equipment Recommendations     Recommended Frequency of Treatment: Once a day     Recommended Duration of Treatment: 2-4 days     Patient / Caregiver Participation / Education   Has the plan of care been explained to the patient / caregiver? yes   Is the patient able to understand the plan of care?   yes    Does the patient / caregiver(s) agree with the plan of care? yes    List Barriers that may interfere  with plan of care:   Pain    Interim Report Due:  12/22/2012     Patient seen for additional physical therapy treatment; see 12/08/2012 physical therapy progress note for details.    x   No additional treatment rendered this encounter.     Reported by:  Kern Reap, PT (te)    PI# [id number]  Vocera [phone]

## 2012-12-08 NOTE — Allied Health Procedure (Signed)
Phase Summary - Mobility Pilot    Mobility Guidelines    Last Documented Mobility Phase: Phase 2    Current Phase: Phase 4    Mobility Session Initiated?:Yes    Mobility Session Completed? Yes      Doristine Locks, PT  [place] #[id number]  Vocera [phone]

## 2012-12-08 NOTE — Allied Health Progress (Signed)
PM&R -- ACUTE CARE SERVICE  PHYSICAL THERAPY DISCHARGE REPORT     Name: Carl Mata  MRN: [id number]   Date: 12/08/2012    Initial Treatment Date:  12/08/2012  Onset Date of Illness or Injury:   12/06/2012    Primary Service:  Trauma    Principal and Significant Associated Diagnoses:  T 12 burst fracture    Functional Status:    Key: Dep=Dependent     Max=Maximal     Mod=Moderate     Min=Minimal     CG=Contact Guard     SB=Stand-By     S=Supervised     I=Independent     NA=Not Applicable     NT=Not Tested     FWW=Front-Wheeled Walker     AC=Axillary Crutches     SPC=Single-Point Cane       Level of Assistance Dep Max Mod Min CG SB S I NA NT Comments:   Rolling          x      Scooting          x      Sidelying/ Supine to Sit        x   Via log roll   Transfer Sit to Stand        x      Transfer Bed to Chair          x    Gait          x   With or without cane   Up/Down Step/Stairs          x    Other:      Discharge Recommendations:        Continued Physical Therapy:  N/A                            Level of Assistance or Supervision:  Independent         Equipment:  SPC was issued        Home Exercise Program: N/A     Disposition:  Home    Patient / Caregiver Training Completed:  Yes    Comments:  Patient can use cane for pressure relief.     Treatment Goals And Objectives:  Met goals    Interim Report and/ or Re-Evaluation Date(s):  NA    Discharge Date:  12/08/2012    Reported by:  Doristine Locks, PT  PI [phone]  Vocera [phone]

## 2012-12-08 NOTE — Discharge Instructions (Signed)
If you develop the following symptoms proceed immediately to the Emergency Room.   * Numbness/tingling in extremities.  * New or increasing weakness in extremities.  * Loss of Bowel or bladder control.  * Chest pain.  * Shortness of breath.  * Persistent nausea and/or vomiting and inablility to tolerate fluids.  * Temp greater than 100.5 or persistent chills.  * Redness, swelling or drainage from incision.  * Worsening pain, not relieved by pain medication.  * Purulent drainage from wound sites.  * Any other concerning symptoms or signs of infection.    You may take colace and senokot for constipation. You do not need to take this medication if you are having regular bowel movements or loose stool.      DIET:   You may resume your regular diet.    BATHING:  You may shower.  Do not bathe or soak wound (e.g. Swimming) for two weeks.    ACTIVITY:  No strenuous activity until follow up visit with neurosurgery spine clinic.   No bending, no twisting, no lifting > 10lbs   TLSO back brace must be worn at all times when sitting up right or standing  Bathing: you may shower in 48 hrs. It is okay to shower without TLSO.   You may not drive while taking narcotic pain medication (e.g. Vicodin/percocet/norco/oxycodone/morphine).    MEDICATIONS   1. Resume all home medications as directed.   2. Start new discharge medications as directed.   3. Do not drive or operate machinery while taking narcotic pain medications.   4. If you are taking other Tylenol containing medicines at home, be sure NOT to exceed 4 gram's (4000 milligrams) of Tylenol per day.   5. You may take colace, senokot, and/or Miralax for constipation. These can be purchased over-the-counter at most pharmacies.  You do not need to take this medication if you are having regular bowel movements or loose stool.    [facility] 24h Pharmacy Services    [facility]  [address], [place] [phone] (will fill [facility] after hours)  [address], [facility] [phone]    [facility]  [address] [phone] (until 11pm)    FOLLOW UP:  Please see below in your other discharge instructions for your follow-up appointment(s) recommendations.  Please call the Trauma Clinic at [phone] for any questions regarding your follow-up appointments.      If you do not have any funding for follow-up or medication. You must complete the application process  to get the county insurance (CMISP). This can be done at the [facility] in [facility].    [facility]   [address] [place] [address]  Phone: [phone]  Hours: Monday - Friday  8:00 am - 5:00 pm    Pt's with CMISP must follow-up with the county clinic and get their meds filled at the [facility].    Once you are discharged you need to take your discharge paperwork and go immediately to Primary Care Clinic Eligibility (window A or B on the first floor of Primary care clinic) and ask for a medical override. Primary Care Clinic Eligibility will ask that all your paperwork be submitted with application. Primary Care Clinic Eligibility has 3 days to setup an appointment for screening and they will give you a county medical record number.    At that time,  you can get one month of medications from [facility] pharmacy (corner of [address]  [address]).  You will not be able to setup a MD appointment until the county eligibility / CMISP is totally approved.

## 2012-12-08 NOTE — Allied Health Consult (Signed)
CLINICAL CASE MANAGEMENT  ASSESSMENT NOTE    Name: Carl Mata  MRN: [id number]   Date of Birth:11-Apr-1976 (49yr) Gender:male    Note Date: 12/08/2012 Note Time: 14:02   Date of Service: 12/08/2012 Time of Service: 11:40     Patient able to participate in plan? Alert and oriented  Contact Person/Caregiver if unable to participate  : Maura Crandall (wife) [phone]   Lives with: independent living facility  Family/Friends to assist: yes  Funding: [facility] PHP/EMP [facility] SYS  Primary Care Physician Identified / Phone Number:  None  Preferred Pharmacy:  [facility] - [address] in [place], [place]   [id number], [id number]  Permanent Address:  [address]  [place] [address]  Discharge Address:  Same as above  Pre-existing Lines/Drains/Wounds: none  Pre-Hospitalization Level of Care: independent w/ ADLs and ambulation  Current Functional Status: Per EMR, SBA w/ rolling, UE balance  Home Health and/or Resources in Place: none   DME in Place: none   Potential Barriers to Discharge: none   Anticipated DC Needs: Pt may need HH PT, DME   Patient/Family offered choice of provider and agreeable with plan/Referrals? yes  Patient/family provided with long-term care community resources:  n/a  Comments: Spoke w/ pt at bedside, stated plan to go back to ILF w/ wife to provide support.   Plan/Follow-up needed: F/u final PT recs    Electronically signed by:   Juleen China RN  Clinical Case Manager  Phone:  [phone]  Pager: [phone]

## 2012-12-08 NOTE — Discharge Summary (Signed)
TRAUMA DISCHARGE SUMMARY  Date of Admission:   12/06/2012  8:55 PM Date of Discharge 12/08/2012   Admitting  Service: Trauma Blue  Discharging Service: Trauma    Attending Physician at time of Discharge: Shary Decamp, MD        Discharge Diagnosis:  Patient Active Problem List   Diagnosis    Right medial knee pain    Abdominal pain    Cough    Fall    Contusion    Back sprain    Elbow contusion    Back pain    Stable burst fx of  T12 fracture       Medications at time of Discharge:  Current Discharge Medication List      CONTINUE these medications which have NOT CHANGED    Details   ALBUTEROL INHA              Procedure(s) Performed:   PELVIS 1 OR 2 VIEWS  L-SPINE 2 OR 3 VIEWS  CT T-SPINE WITHOUT CONTRAST  CT L-SPINE WITHOUT CONTRAST  CT ABDOMEN + CT PELVIS, WITH CONTRAST  CHEST 1 VIEW  CT C-SPINE WITHOUT CONTRAST       Operations:  none    Consultation(s):   None    Complications/Infections:  none    Reason for Admission and Brief HPI:   36yr old male with PMH significant of asthma s/p fall from height approx 1ft when he was fixing an AC unit on a ladder, and landed on his buttocks with immediate pain in his lower back. On plain film demonstrated findings indicating T12 compression fx. Pt had prior fall in May 14 without injury on [facility] studies.  Denies urinary or bowel incontinence. Denies focal motor weakness, or new onset of tingling of numbness in all extremities except for LBP pain.     Hospital Course:   Admitted to the [facility] on 12/06/2012.  Please see admission H&P and all relevant progress notes.  The patient's hospital course by injury or issue has been:    Principal Problem:    Fall      Overview: C-spine cleared.     Active Problems:    Stable burst fx of  T12 fracture      Overview: Patient sustained a stable T12 burst fracture and was seen and examined by       the neurosurgery spine service who do not recommend surgical intervention       for this type of fracture. It was recommended that he be  fitted for a TLSO       back brace which is to be worn while sitting upright and standing at all       times for 4-6 weeks. His pain is controlled with PO narcotics and       flexeril. He is to follow up with the neurosurgery spine clinic in 4 weeks       or sooner if he is experiencing worsening of symptoms.         Carl Mata received discharge instructions to watch for signs and symptoms including fever, chills, nausea, vomiting, increasing pain, and erythema.  He was tolerating a regular diet, voiding independently, and his pain was well controlled at the time of discharge.  He was also ambulating independently with the use of a cain for walking support and the TLSO brace fitted in place.     Disposition:  Medically stable for discharge to home    Vital Signs:  Current  Vitals  Temp: 36.7 C (98.1 F)  BP: 124/80 mmHg  Pulse: 75  Resp: 12  SpO2: 97 %  Flow (L/Min)(Oxygen Therapy): 2   Weight: 68.4 kg (150 lb 12.7 oz)         Discharge Instructions:  If you develop the following symptoms proceed immediately to the Emergency Room.   * Numbness/tingling in extremities.  * New or increasing weakness in extremities.  * Loss of Bowel or bladder control.  * Chest pain.  * Shortness of breath.  * Persistent nausea and/or vomiting and inablility to tolerate fluids.  * Temp greater than 100.5 or persistent chills.  * Redness, swelling or drainage from incision.  * Worsening pain, not relieved by pain medication.  * Purulent drainage from wound sites.  * Any other concerning symptoms or signs of infection.    You may take colace and senokot for constipation. You do not need to take this medication if you are having regular bowel movements or loose stool.      DIET:   You may resume your regular diet.    BATHING:  You may shower.  Do not bathe or soak wound (e.g. Swimming) for two weeks.    ACTIVITY:  No strenuous activity until follow up visit with neurosurgery spine clinic.   No bending, no twisting, no lifting  > 10lbs   TLSO back brace must be worn at all times when sitting up right or standing  Bathing: you may shower in 48 hrs. It is okay to shower without TLSO.   You may not drive while taking narcotic pain medication (e.g. Vicodin/percocet/norco/oxycodone/morphine).    MEDICATIONS   1. Resume all home medications as directed.   2. Start new discharge medications as directed.   3. Do not drive or operate machinery while taking narcotic pain medications.   4. If you are taking other Tylenol containing medicines at home, be sure NOT to exceed 4 gram's (4000 milligrams) of Tylenol per day.   5. You may take colace, senokot, and/or Miralax for constipation. These can be purchased over-the-counter at most pharmacies.  You do not need to take this medication if you are having regular bowel movements or loose stool.    [facility] 24h Pharmacy Services    [facility]  [address], [place] [phone] (will fill [facility] Rx after hours)  [address], [facility] [phone]    [facility]  [address] [phone] (until 11pm)    FOLLOW UP:  Please see below in your other discharge instructions for your follow-up appointment(s) recommendations.  Please call the Trauma Clinic at [phone] for any questions regarding your follow-up appointments.      If you do not have any funding for follow-up or medication. You must complete the application process  to get the county insurance (CMISP). This can be done at the [facility] in [facility].    [facility]   [address] [place] [address]  Phone: [phone]  Hours: Monday - Friday  8:00 am - 5:00 pm    Pt's with CMISP must follow-up with the county clinic and get their meds filled at the [facility] [employer].    Once you are discharged you need to take your discharge paperwork and go immediately to Primary Care Clinic Eligibility (window A or B on the first floor of Primary care clinic) and ask for a medical override.  Primary Care Clinic Eligibility will ask that all your paperwork be submitted  with application. Primary Care Clinic Eligibility has 3 days to setup an appointment for screening and they will give you a county medical record number.    At that time,  you can get one month of medications from [facility] pharmacy (corner of [address] and [address]).  You will not be able to setup a MD appointment until the county eligibility / CMISP is totally approved.        Recommended Follow Up Appointments:    Neurosurgery Clinic  [address] [place] [address]  [phone]  In 4 weeks  If symptoms worsen       Comments to Outpatient Physician:    Total time spent on discharge 30 minutes.    Ethlyn Daniels, MD  Trauma [facility]  Orthopaedic Surgery, PGY 1  [facility]  PI # [phone]  Service pg: [id number]        CC to:    No Pcp No Pcp, MD

## 2012-12-13 ENCOUNTER — Emergency Department
Admission: EM | Admit: 2012-12-13 | Discharge: 2012-12-13 | Disposition: A | Discharge status: Home / Self Care | Payer: MEDICAID | Attending: Emergency Medicine | Admitting: Emergency Medicine

## 2012-12-13 ENCOUNTER — Encounter: Payer: Self-pay | Admitting: Emergency Medicine

## 2012-12-13 DIAGNOSIS — X58XXXA Exposure to other specified factors, initial encounter: Secondary | ICD-10-CM | POA: Insufficient documentation

## 2012-12-13 DIAGNOSIS — M549 Dorsalgia, unspecified: Secondary | ICD-10-CM | POA: Insufficient documentation

## 2012-12-13 DIAGNOSIS — Y929 Unspecified place or not applicable: Secondary | ICD-10-CM | POA: Insufficient documentation

## 2012-12-13 DIAGNOSIS — S22042D Unstable burst fracture of fourth thoracic vertebra, subsequent encounter for fracture with routine healing: Secondary | ICD-10-CM | POA: Insufficient documentation

## 2012-12-13 DIAGNOSIS — T148XXA Other injury of unspecified body region, initial encounter: Secondary | ICD-10-CM | POA: Insufficient documentation

## 2012-12-13 MED ORDER — OXYCODONE 5 MG TABLET
5.0000 mg | ORAL_TABLET | ORAL | Status: AC | PRN
Start: 2012-12-13 — End: 2012-12-20

## 2012-12-13 MED ORDER — OXYCODONE-ACETAMINOPHEN 10 MG-325 MG TABLET
1.0000 | ORAL_TABLET | ORAL | Status: AC | PRN
Start: 2012-12-13 — End: 2012-12-20

## 2012-12-13 MED ORDER — OXYCODONE-ACETAMINOPHEN 5 MG-325 MG TABLET
1.0000 | ORAL_TABLET | Freq: Once | ORAL | Status: AC
Start: 2012-12-13 — End: 2012-12-13
  Administered 2012-12-13: 2 via ORAL
  Filled 2012-12-13: qty 2

## 2012-12-13 NOTE — ED Nursing Note (Signed)
Pt up to bathroom, ambulates slowly, but steadily.

## 2012-12-13 NOTE — ED Initial Note (Signed)
EMERGENCY DEPARTMENT PHYSICIAN NOTE - Carl Mata       Date of Service:   12/13/2012 11:58 AM Patient's PCP: No Pcp No Pcp   Note Started: 12/13/2012 17:41 DOB: 04-06-1976             Chief Complaint   Patient presents with    Back Pain Acute     T12 compression fx, 1 week ago, pt states was laying in bed when the pain started.            The history provided by the patient.  Interpreter used: No    Carl Mata is a 37yr old male, with a past medical history significant for recent hospitalization for T12 fx s/p fall 1 week ago, who presents to the ED with a chief complaint of worsening of back pain that began this am. Patient states this am he ran out of his pain medication as well as felt a twinge and pop with resulting worsening pain but with continued ability to ambulate. Patient endorses taking Percocet PO with significant sx relief (from 8/10 to 5/10). Patient denies chronic back pain prior to T12 fx. Denies new weakness, numbness, fever, chest pain, SOB, vomiting, dysuria, difficulty urinating, change in bowel habits, stool incontinence, enuresis or any other physical complaint at this time.    No IVDA or new trauma    Quality: piercing  Location: back  Severity: 9/10  Time Course: constant  Progression: gradually worsening  Duration: since this am  Palliative factors: narcotics makes it better.  Provocative factors: movement makes it worse.  A full history, including pertinent past medical, family and social history was reviewed.    HISTORY:  There are no hospital problems to display for this patient.   Allergies   Allergen Reactions    Nsaids (Non-Steroidal Anti-Inflammatory Drug) Swelling      Past Medical History:    Unspecified mental or behavioral problem                      Unspecified asthma(493.90)                                 History reviewed. No pertinent surgical history.   Social History    Marital Status: SINGLE              Spouse Name:                       Years of Education:                  Number of children:               Occupational History    None on file    Social History Main Topics    Smoking Status: Current Every Day Smoker        Packs/Day: 0.20  Years: 1.5       Types: Cigarettes    Smokeless Status: Not on file                       Alcohol Use: No              Drug Use: Yes                Special: Methamphetamine    Sexual Activity: Not on file  Other Topics            Concern    None on file    Social History Narrative    None on file     No family history on file.             Review of Systems   Respiratory: Negative for shortness of breath.    Cardiovascular: Negative for chest pain.   Gastrointestinal: Negative for nausea, vomiting and abdominal pain.        No change in bowel habits/ bowel incontinence.      Genitourinary: Negative for dysuria, enuresis and difficulty urinating.   Musculoskeletal: Positive for back pain.   Neurological: Negative for weakness and numbness.   All other systems reviewed and are negative.        TRIAGE VITAL SIGNS:  Temp: 37.1 C (98.8 F) (12/13/12 1156)  Temp src: Oral (12/13/12 1156)  Pulse: 88 (12/13/12 1156)  BP: 108/70 mmHg (12/13/12 1156)  Resp: 16 (12/13/12 1156)  SpO2: 100 % (12/13/12 1156)  Weight: (not recorded)    Physical Exam   Nursing note and vitals reviewed.  Constitutional: He is oriented to person, place, and time. He appears well-developed and well-nourished.   Patient in mild distress secondary to pain.   HENT:   Head: Normocephalic.   Mouth/Throat: Oropharynx is clear and moist.   Eyes: EOM are normal. Pupils are equal, round, and reactive to light. Right eye exhibits no discharge. Left eye exhibits no discharge.   Neck: Normal range of motion. Neck supple.   Cardiovascular: Normal rate, regular rhythm, normal heart sounds and intact distal pulses.  Exam reveals no gallop and no friction rub.    No murmur heard.  Pulmonary/Chest: Effort normal and breath sounds normal. No respiratory distress. He has no wheezes. He  has no rales.   Abdominal: Soft. He exhibits no distension. There is no tenderness.   Musculoskeletal: He exhibits tenderness. He exhibits no edema.   R-sided lumbar perivertebral muscular TTP.   Mid thoracic spinal TTP.  No pitting edema to BLE.   Neurological: He is alert and oriented to person, place, and time. No cranial nerve deficit.   MAE  5/5 strength   Sensation intact to all extremities.   No focal or lateralizing neurologic findings.   Skin: Skin is warm and dry. He is not diaphoretic.   Psychiatric: He has a normal mood and affect. His behavior is normal.     INITIAL ASSESSMENT & PLAN, MEDICAL DECISION MAKING, ED COURSE:  Carl Mata is a 37yr old male who presents with a chief complaint of low back pain. After history and exam, I feel the diagnosis is most likely muscle strain. Differential diagnosis includes, but is not limited to fx, cauda equina and radiculopathy.  The patient is hemodynamically stable and will require pain control as an immediate intervention. Initial treatment and studies to evaluate this problem will include serial exams and analgesia as needed.       ED Course Update: The patient was observed in the ED. The results of the ED evaluation were notable for the following:    Pertinent medications: Percocet PO, given for pain.    ED Course:   17:24 Patient was seen and evaluated. Carl Patrick, DO, Resident Physician was present at this time.     Patient Summary: 36 yom with hx of T12 fx after fall two weeks ago presents with back pain. Patient ran out of pain medication this  AM and would like refill. Patient has no new weakness, or numbness, new trauma, bowel or bladder incontinence or retention to suggest cauda equina, herniation, or new fracture. No fever or hx of recent IVDA to suggest epidural abscess. Patient's pain back to baseline of 5/10 after one dose percocet. Patient discharged with short course of pain medication and instructions to fu with PMD.      LAST VITAL  SIGNS:  Temp: 36.5 C (97.7 F) (12/13/12 1525)  Temp src: Oral (12/13/12 1525)  Pulse: 87 (12/13/12 1643)  BP: 126/80 mmHg (12/13/12 1642)  Resp: 17 (12/13/12 1643)  SpO2: 100 % (12/13/12 1643)  Weight: (not recorded)      Clinical Impression: back pain, muscular strain    Anticipated work up needed: fu with PMD    Disposition: Discharge. Follow up with PMD. ED discharge instructions were reviewed and provided. It was discussed that radiology reports are preliminary and the patient will be contacted for any changes in interpretation.      MDM COMPLEXITY  Overall Complexity of MDM is: low      PRESENT ON ADMISSION:  Are any of the following four conditions present or suspected on admission: decubitus ulcer, infection from an intravascular device, infection due to an indwelling catheter, surgical site infection or pneumonia? No.    PATIENT'S GENERAL CONDITION:  Fair: Vital signs are stable and within normal limits. Patient is conscious but may be uncomfortable. Indicators are favorable.    DISCLAIMER:   This document serves as my personal record of services taken in my presence. It was created on 12/13/2012 on my behalf by Kelli Churn, a trained medical scribe.     I have reviewed this document and agree that this note accurately reflects the history and exam findings, the patient care provided, and my medical decision making.    This patient was seen, evaluated, and the care plan was developed in conjunction with Tillman Sers, DO - Resident Physician. I agree with the findings and plan as outlined in our combined note.      12/13/2012  Electronically Signed By: The presence of an emergency medical condition was specifically addressed and either ruled out or stabilized to the extent possible in the ER.  Note is electronically signed by;   Malvin Johns. Dionisio David, MD, MPH,   Attending Physician, Department of Emergency Medicine   [facility]  PI# [phone]

## 2012-12-13 NOTE — Discharge Instructions (Signed)
Thank you for choosing [facility] for your emergency health care needs. It has been our privilege to take care of you today. Your primary complaints have been evaluated based on your history, lab tests, imaging tests and you have been treated for your symptoms and discharged home. Please take all medicines that are prescribed to you as directed (see below).  It is crucial, if you have a primary care physician, to follow up with him or her in the time frame recommended as many health conditions that seem self-limited initially may actually worsen over time.  If you do not have a primary care physician, we will outline the various resources available for you to find one.    If at any time you feel that your condition is worsening, call your doctor or return to the Emergency Department for reevaluation. CALL [phone] IF YOU THINK YOU ARE HAVING A MEDICAL EMERGENCY. Return to the Emergency Department if you are unable to obtain the recommended follow-up treatment or you are not better as expected. You can call the Medical Center with questions at [phone].    Please realize that the results of some studies that you had done during your stay with us (such as x-rays and cultures) have only preliminarily results at this time.  Results of these studies may change as more information becomes available or as the studies are re-evaluated by other members of our health care team in the next few days. We will attempt to contact you with any important changes or additions to the studies that were obtained today, particularly if any of these results require a change in your treatment.    GENERAL PAIN MEDICATION PRECAUTIONS    You may have been prescribed medications for pain today. Some of these may contain a narcotic (Vicodin, Norco, Percocet, etc.) Take these pain medications as prescribed. You also may have been prescribed a muscle relaxant or anxiolytic (benzodiazepine) such as valium (diazepam) or ativan  (lorazepam). Do not drink alcohol, drive, or operate heavy machinery while taking either narcotic or benzodiazepine medications. All narcotics and benzodiazepines have a risk of dependence, so please use with caution. If you were prescribed Vicodin, Norco, or Percocet, do not take additional products containing Tylenol (acetaminophen), as this can cause an acetaminophen overdose and can damage your liver. Do not take more than 4000mg of acetaminophen daily.    You may also have been prescribed an anti-inflammatory pain medication (NSAID) such as ibuprofen (Motrin, Advil) or naproxen (Aleve). Take the NSAID as prescribed, with food or milk.  Stop taking the NSAID if you develop abdominal pain, vomiting blood or dark/tarry or bloody stools. Be sure to drink plenty of fluids, at least 1-2 liters of water per day while taking an NSAID unless you have congestive heart failure or other condition that requires you to limit your water intake.    We have given you a prescription for roxicodone. You may fill these prescriptions at a pharmacy of your choice. Please take these as directed.    [place] [facility] 24 hour Pharmacy Services    [facility]  [address], [place]      [phone] (will fill [place] [facility] after hours)  [address], [place]   [phone]    [facility]  [address]       [phone]  (until 11pm)    [facility] for Uninsured Without Current Healthcare    [facility], [address] [place], [place]  [phone]    [facility], [address], [place] [phone] (Sunday 8a-2p)    (Last updated 06/09/10)

## 2012-12-13 NOTE — ED Triage Note (Signed)
biba c/o back pain sp T12 fx last week, has brace pain increased while sleeping, pt told to come back if pain increased, pt csm intact

## 2012-12-13 NOTE — ED Nursing Note (Signed)
Pleshakov,MD at bedside.

## 2012-12-13 NOTE — ED Nursing Note (Signed)
Per pt ran out of percocet yesterday. Today woke up with new pain to mid back and concern that he might have done something to his back while sleeping because this morning per pt he could barely get out of bed. Per internal triage RN witnessed pt walked ealier. Per pt increasing pain with ambulation, no weakness. C/o numbness and tingling to lower extremities. Denies any problem with lost of control of BM or urinating. Family at bedside.

## 2012-12-13 NOTE — ED Nursing Note (Signed)
Dr. Panacek at bedside.

## 2012-12-13 NOTE — ED Nursing Note (Signed)
Pleshakov at bedside. Found pt sleeping/resting. Per pt back pain down to 5/10.

## 2012-12-13 NOTE — ED Nursing Note (Signed)
Dc instructions given, vss, ambulates independently

## 2012-12-17 ENCOUNTER — Emergency Department
Admission: EM | Admit: 2012-12-17 | Discharge: 2012-12-17 | Disposition: A | Discharge status: Home / Self Care | Payer: MEDICAID | Attending: Emergency Medicine | Admitting: Emergency Medicine

## 2012-12-17 ENCOUNTER — Emergency Department (EMERGENCY_DEPARTMENT_HOSPITAL): Payer: MEDICAID

## 2012-12-17 DIAGNOSIS — M549 Dorsalgia, unspecified: Secondary | ICD-10-CM | POA: Insufficient documentation

## 2012-12-17 DIAGNOSIS — Z8781 Personal history of (healed) traumatic fracture: Secondary | ICD-10-CM | POA: Insufficient documentation

## 2012-12-17 DIAGNOSIS — R209 Unspecified disturbances of skin sensation: Secondary | ICD-10-CM | POA: Insufficient documentation

## 2012-12-17 MED ORDER — HYDROCODONE 5 MG-ACETAMINOPHEN 325 MG TABLET
1.0000 | ORAL_TABLET | ORAL | Status: AC | PRN
Start: 2012-12-17 — End: 2012-12-24

## 2012-12-17 MED ORDER — MORPHINE 2 MG/ML INJECTION SYRINGE
4.0000 mg | INJECTION | INTRAMUSCULAR | Status: DC | PRN
Start: 2012-12-17 — End: 2012-12-17
  Administered 2012-12-17: 4 mg via INTRAVENOUS
  Filled 2012-12-17: qty 1

## 2012-12-17 MED ORDER — HYDROCODONE 5 MG-ACETAMINOPHEN 325 MG TABLET
1.0000 | ORAL_TABLET | Freq: Once | ORAL | Status: AC
Start: 2012-12-17 — End: 2012-12-17
  Administered 2012-12-17: 2 via ORAL
  Filled 2012-12-17: qty 2

## 2012-12-17 MED ORDER — DOCUSATE SODIUM 100 MG CAPSULE
100.0000 mg | ORAL_CAPSULE | Freq: Two times a day (BID) | ORAL | Status: AC
Start: 2012-12-17 — End: 2012-12-31

## 2012-12-17 NOTE — ED Initial Note (Signed)
EMERGENCY DEPARTMENT PHYSICIAN NOTE - Carl Mata       Date of Service:   12/17/2012 12:06 PM Patient's PCP: No Pcp No Pcp   Note Started: 12/17/2012 16:02 DOB: Aug 25, 1975             Chief Complaint   Patient presents with    Back Pain Acute     T-12 fx x 1 week ago, pain is worsening     The history provided by the patient.  Interpreter used: No    Carl Mata is a 37yr old male, who presents to the ED with a chief complaint of increasing back pain that began 2 weeks ago. Pt was admitted at [facility] for T-12 fracture 2 weeks ago when he fell from a ladder. The pain has been gradually worsening over the past several days. Pt describes excruciating pain and numbness on both legs when he walks. Describes feeling tremors in his legs and they "shake and buckle." Has pain during urination. Last night he experienced enuresis. Has been taking Norco for the pain but ran out of his medications. Denies nausea, vomiting, chills, fevers.     A full history, including pertinent past medical, family and social history was reviewed.    HISTORY:  There are no hospital problems to display for this patient.   Allergies   Allergen Reactions    Nsaids (Non-Steroidal Anti-Inflammatory Drug) Swelling      Past Medical History:    Unspecified mental or behavioral problem                      Unspecified asthma(493.90)                                 No past surgical history on file.   Social History    Marital Status: SINGLE              Spouse Name:                       Years of Education:                 Number of children:               Occupational History    None on file    Social History Main Topics    Smoking Status: Current Every Day Smoker        Packs/Day: 0.20  Years: 1.5       Types: Cigarettes    Smokeless Status: Not on file                       Alcohol Use: No              Drug Use: Yes                Special: Methamphetamine    Sexual Activity: Not on file          Other Topics            Concern    None on  file    Social History Narrative    None on file     No family history on file.       Review of Systems   Constitutional: Negative for fever and chills.   HENT: Negative for rhinorrhea and neck pain.  Respiratory: Negative for shortness of breath.    Gastrointestinal: Negative for nausea and vomiting.   Genitourinary: Positive for dysuria and enuresis (last night).   Musculoskeletal: Positive for back pain.   Skin: Negative for wound.   Neurological: Positive for tremors and numbness.   All other systems reviewed and are negative.      TRIAGE VITAL SIGNS:  Temp: 36.7 C (98.1 F) (12/17/12 1142)  Temp src: Oral (12/17/12 1142)  Pulse: 101 (12/17/12 1142)  BP: 109/70 mmHg (12/17/12 1142)  Resp: 16 (12/17/12 1142)  SpO2: 99 % (12/17/12 1142)  Weight: 68.04 kg (150 lb) (12/17/12 1205)    Physical Exam   Nursing note and vitals reviewed.  Constitutional: He is oriented to person, place, and time. He appears well-developed and well-nourished. No distress.   HENT:   Head: Normocephalic and atraumatic.   Cardiovascular: Intact distal pulses.    Pulmonary/Chest: Effort normal.   Musculoskeletal: He exhibits tenderness.   Motor strength intact. Midline T12 TTP.     Neurological: He is alert and oriented to person, place, and time.   Skin: Skin is warm and dry. He is not diaphoretic.   Psychiatric: He has a normal mood and affect. His behavior is normal.     INITIAL ASSESSMENT & PLAN, MEDICAL DECISION MAKING, ED COURSE:  Carl Mata is a 37yr old male who presents with a chief complaint of back pain. After history and exam, I feel the diagnosis is most likely . Differential diagnosis includes, but is not limited to shift pelvic fracture, spinal cord involving fall 2/2 previous fracture, sciatica, chronic low back pain.  The patient is hemodynamically stable and will require monitoring as an immediate intervention. Initial treatment and studies to evaluate this problem will include serial exams, labs and analgesia as  needed.     ED Course Update: The patient was observed in the ED. The results of the ED evaluation were notable for the following:    Pertinent lab results (reviewed and interpreted independently by me):   Glucose: 102  BUN: 11     Pertinent imaging results  (reviewed and interpreted independently by me):  Compression fracture of T12 is again seen with approximately 4 mm posterior displacement of the superior posterior vertebral body fragment and 5 mm anterior displacement of the anterior superior fragment which causes mild canal stenosis. Widening between the T11 and T12 spinous processes suggests some posterior ligamentous injury. There are few foci of air within the T11-T12 intervertebral disc space which may represent vacuum phenomenon. L3 limbus vertebra. Schmorl's node on the inferior endplate of L1.    Pertinent medications:   325 mg Norco 5 PO, given for pain.    Further MDM  Additional data reviewed? N/A    Interventions:  Norco 5     ED Course:   15:35 Patient was seen and evaluated.    15:57 Norco 5 was administered    Patient Summary:   Carl Mata is a 37yr old male, who presents to the ED with a chief complaint of increasing back pain that began 2 weeks ago.  Vague paresthesias and possible episode of incontinence.   Pt seen and evaluated by Neurosurgery service.  Pt unable to undergo MRI secondary to home arrest leg bracelet.  CT T/L spine w/o significant change.  Pt fitted with TLSO and will follow Neurosurgery recs to f/u in their clinic in 4-6 wks.  Return precautions discussed.  Pt endorsed understanding and is amenable with plan.  LAST VITAL SIGNS:  Temp: 36.4 C (97.5 F) (12/17/12 1526)  Temp src: Oral (12/17/12 1526)  Pulse: 81 (12/17/12 1526)  BP: 128/34 mmHg (12/17/12 1526)  Resp: 16 (12/17/12 1526)  SpO2: 99 % (12/17/12 1526)  Weight: 68.04 kg (150 lb) (12/17/12 1205)    Clinical Impression:   Back pain  Hx of T12 burst fracture    Anticipated work up needed: none    Disposition:  Discharge. Follow up with neurosurgery clinic . ED discharge instructions were reviewed and provided. It was discussed that radiology reports are preliminary and the patient will be contacted for any changes in interpretation.    MDM COMPLEXITY  Overall Complexity of MDM is: high    PRESENT ON ADMISSION:  Are any of the following four conditions present or suspected on admission: decubitus ulcer, infection from an intravascular device, infection due to an indwelling catheter, surgical site infection or pneumonia? No.    PATIENT'S GENERAL CONDITION:  Fair: Vital signs are stable and within normal limits. Patient is conscious but may be uncomfortable. Indicators are favorable.    DISCLAIMER:   This document serves as my personal record of services taken in my presence. It was created on 12/17/2012 on my behalf by Ivin Poot, a trained medical scribe.     I have reviewed this document and agree that this note accurately reflects the history and exam findings, the patient care provided, and my medical decision making.    12/17/2012  Electronically Signed By: Carole Civil, MD  Attending Physician, Department of Emergency Medicine  [facility]

## 2012-12-17 NOTE — ED Nursing Note (Signed)
Pt verbalized understanding with MD's discharge instructions. VSS. Ambulatory home in stable condition.

## 2012-12-17 NOTE — ED Nursing Note (Signed)
Pt resting comfortably. No s/s of distress.  Appears asleep, eyes closed, resps even and unlabored.  Awaiting CT results

## 2012-12-17 NOTE — Consults (Addendum)
DEPARTMENT OF NEUROLOGICAL SURGERY  SPINE CONSULTATION    Consulting Service:  ED    Date of Admission:   12/17/2012 12:06 PM Date of Service: 12/17/2012   Note Started: 12/17/2012, 16:45 Name of Requesting Attending: ED         REASON FOR CONSULTATION:  T12 burst fx and 1 x urinary incontinence 24hrs ago    HISTORY OF PRESENT ILLNESS:  37yr old male with PMH significant of asthma s/p fall from height approx 7ft when he was fixing an [facility] unit on a ladder, and landed on his buttocks with immediate pain in his lower back on 6/30 and was evaluated in ED. CTL spine back then revealed T12 burst fx without cord compression or disruption of ligamentous components. Was unable to obtain MRI due to ankle brace for parole tracking. Pt was recommended to be managed on TLSO brace and follow up in clinic if pain persist. Pt's back pain is significantly improved, but reports recent onset of tingling of his BLEs accompanied by 1 episode of urinary incontinence a day prior. Denies bowel incontinence. Denies focal motor weakness or other sensory disturbances. Pt indicated his TLSO brace was taken by someone whom he lives with at a group boarding place.    HISTORY:  Patient Active Problem List    Diagnosis Date Noted    Back pain 12/14/2012    Stable burst fx of  T12 fracture 12/14/2012     Overview Note:     Patient sustained a stable T12 burst fracture and was seen and examined by the neurosurgery spine service who do not recommend surgical intervention for this type of fracture. It was recommended that he be fitted for a TLSO back brace which is to be worn while sitting upright and standing at all times for 4-6 weeks. His pain is controlled with PO narcotics and flexeril. He is to follow up with the neurosurgery spine clinic in 4 weeks or sooner if he is experiencing worsening of symptoms.       Fall 12/07/2012     Overview Note:     C-spine cleared.       Elbow contusion 11/28/2012    Contusion 10/31/2012    Back sprain  10/31/2012    Cough 10/06/2012    Abdominal pain 09/24/2012    Right medial knee pain 09/20/2012    Allergies   Allergen Reactions    Nsaids (Non-Steroidal Anti-Inflammatory Drug) Swelling      Past Medical History   Diagnosis Date    Unspecified mental or behavioral problem      bipolar    Unspecified asthma(493.90)      No past surgical history on file.   (Not in a hospital admission)   Social History     Occupational History    Not on file.     Social History Main Topics    Smoking status: Current Every Day Smoker -- 0.20 packs/day for 1.5 years     Types: Cigarettes    Smokeless tobacco: Not on file    Alcohol Use: No    Drug Use: Yes     Special: Methamphetamine    Sexually Active: Not on file    No family history on file.     There is no immunization history on file for this patient.    The patient's past medical, family, and social history was reviewed and confirmed.    ROS:  All other systems negative except as noted in the HPI.  VITAL SIGNS:  Vital Signs (Last Recorded):  Temp: 36.4 C (97.5 F) (12/17/12 1526)  Temp src: Oral  Pulse: 81  BP: 128/34 mmHg  Resp: 16  SpO2: 99 %     Weight: 68.04 kg (150 lb)  There is no height on file to calculate BSA.  Body mass index is 22.81 kg/(m^2).    PHYSICAL EXAM:  General Appearance: No acute distress, interactive     Neuro Exam:  GCS: Eyes: 4 Verbal: 5 Motor: 6    Motor:    Deltoid Biceps Triceps Wrist Flexion Wrist Extension Hand Intrinsics   Right 5 5 5 5 5 5    Left 5 5 5 5 5 5        Hip Flexion Knee Extension Knee Flexion Dorsiflexion Great Toe Extension Plantar Flexion   Right 5 5 5 5 5 5    Left 5 5 5 5 5 5      Reflexes:   Biceps Triceps Brachio-radialis Patellar Achilles Plantar Flexor Response   Right +2 +2 +2 +2 +1 down   Left +2 +2 +2 +2 +1 down               Rectal:Rectal tone intact. No saddle anesthesia, anal sensation intact to pin prick.           Hoffman-Troemnor: negative          Pectoralis: negative  Sensory: Grossly intact to LT and  pinprick.     Imaging: CT L spine pending     LAB TESTS / STUDIES:  I personally reviewed the following: prior imaging studies    Latest CMP  Recent labs for the past 48 hours     12/17/12 1234    SODIUM 136    POTASSIUM 4.5    CHLORIDE 105    CARBON DIOXIDE TOTAL 28    UREA NITROGEN, BLOOD (BUN) 11    CREATININE BLOOD 0.82    E-GFR, AFRICAN AMERICAN &gt;60    E-GFR, NON-AFRICAN AMERICAN &gt;60    GLUCOSE 102*    CALCIUM 9.3    PROTHROMBIN TIME --    ALBUMIN --    ALKALINE PHOSPHATASE (ALP) --    ALANINE TRANSFERASE (ALT) --     Latest CBC  Recent labs for the past 48 hours     12/17/12 1234    WHITE BLOOD CELL COUNT 7.2    HEMOGLOBIN 15.2    HEMATOCRIT 43.2    PLATELET COUNT 324     Latest INR / APTT    No results found for this basename: INR,APTT in the last 48 hours  Tox screen: NA  Blood alcohol: NA    ASSESSMENT: 37yr old male on parole status with electronic ankle brace s/p fall from height 12 days ago with T12 burst fx on CT studies presents with reports of 1x urinary incontinence and recent onset of BLEs. Neurological exam is WNL without lateralizing signs. Sensation intact without indication of gross loss of rectal tone or saddle anesthesia.     RECOMMENDATIONS:      -Recommend CT L spine to evaluate for interval changes of T12 burst fx  -Orthotics order for TLSO replacement. (ordered and orthotics contacted, will replaced brace today)  -Pain control if any  -Monitor for further urinary incontinence vs retention episodes     -Will staff    -Please page [phone] for questions and concerns.     Lissa Hoard, M.D. PGY2  [place]-Neurosurgery  NSG Svc Pager: [phone]  Direct Pager [phone]  PI# [phone]  Addendum:     Patient has TLSO at bedside. Repeat CT L spine shows stable fracture.     This patient was discussed with Dr. Jenetta Downer. No role for surgical intervention at this time. OK for patient to be discharged home with followup with Dr. Selena Batten in 4-6 weeks.     Harriett Sine, M.D.  Licensed Resident,  PGY-5  Neurological Surgery  Personal Pager #[phone]/ Service Pager [phone]          Wear TLSO at all times. F/u with Dr. Selena Batten.

## 2012-12-17 NOTE — ED Nursing Note (Signed)
Pt appears asleep, eyes closed, resps even and unlabored.  VSS. No distress noted.

## 2012-12-17 NOTE — ED Triage Note (Signed)
Pt c/o increasing back pain, admitted here for T-12 fx x 2 weeks ago, pain is not improving, numbness to bilat legs, tingling in back, +urinary incontinence

## 2012-12-17 NOTE — ED Progress Note (Signed)
Emergency Department Triage Note     Subjective: Carl Mata is a 37yr old male who presents to the ED with a chief complaint of back pain.  Since fracture, but worse x24 hours. Tingling to BLE. Ambulating normally. One episode of urinary incontinence last night.  States he was wearing TLSO brace until yesterday when someone took it.     Patient's phone number confirmed - per EMR.     Objective (pertinent exam findings):    BP 109/70  Pulse 101  Temp(Src) 36.7 C (98.1 F) (Oral)  Resp 16  Wt 68.04 kg (150 lb)  BMI 22.81 kg/m2  SpO2 99%     General: No acute distress.    Assessment: Carl Mata is a 37yr old male with back pain and.      Plan: Initial studies to evaluate this problem will include labs. Further workup will then proceed in patient care area c/d. ?  Seen and initially evaluated by:  Malen Gauze, MD, Attending Physician, Department of Emergency Medicine       This patient was seen by a physician in triage. Please see the ED Initial Note for full details of this patient's care. This note covers the brief initial evaluation performed to obtain initial studies to expedite the patient's care. It is not intended to be a comprehensive document of the patient's ED visit.    This patient was instructed that this preliminary assessment and any studies obtained do not constitute a full workup of the patient's chief complaint. The patient was instructed to wait in the assigned area after the triage evaluation to be reevaluated by a physician after initial studies are completed.

## 2012-12-17 NOTE — ED Nursing Note (Signed)
Pt presents to ED with c/o pain, numbness, and tingling to lower back with numbness to BLE.  Pt states he fell off a ladder approx 2 weeks ago and fractured T-12.  Good circulation and strength to BLE. Pt also c/o urinary incontinence.

## 2012-12-17 NOTE — Allied Health Progress (Signed)
Orthotics/Prosthetics Inpatient    Progress Note: Mr. Carl Mata was seen today in the [facility] Department # E-5 for fitting for an [employer] as requested by his physician.Mr. Carl Mata was seen in the Emergency Department on December 07, 2012 for fitting for an [employer].  He states that he fell asleep at the group home and when he awoke the TLSO was gone.  I fit this patient with a size large TLSO with 12" Contour back panel and small" Sternal Bar. I discussed with patient the donning and doffing, skincare precautions, and wearing protocol as needed. I advised patient to use a T-shirt first before donning the orthosis to act as a protective interfaces to protect the skin. Patient was instructed not to sleep with the orthosis. Patient states that pain status is reduced with the orthosis. Will continue to follow patient as needed. Patient has no questions at this time. I provided written instructions and left at bedside. Informed patient's nurse of pain status. If there are any questions or concerns please contact Orthotics at [phone].    Pain Status: 8/10    ICD9#...................805.2    PI #.....................[id number]    Treating Physician....Marland KitchenMarland KitchenDr. Silvio Pate [place]    Education:     Handout: yes  Verbal Instruction: yes    Jenell Milliner, ORTHOTIST # [phone]  Dept. Of PM&R  [phone]  Beeper [phone]

## 2012-12-23 ENCOUNTER — Emergency Department (EMERGENCY_DEPARTMENT_HOSPITAL): Payer: MEDICAID

## 2012-12-23 ENCOUNTER — Emergency Department
Admission: EM | Admit: 2012-12-23 | Discharge: 2012-12-23 | Disposition: A | Discharge status: Home / Self Care | Payer: MEDICAID | Attending: Emergency Medicine | Admitting: Emergency Medicine

## 2012-12-23 DIAGNOSIS — S22042D Unstable burst fracture of fourth thoracic vertebra, subsequent encounter for fracture with routine healing: Secondary | ICD-10-CM | POA: Insufficient documentation

## 2012-12-23 DIAGNOSIS — Z4789 Encounter for other orthopedic aftercare: Secondary | ICD-10-CM

## 2012-12-23 DIAGNOSIS — M549 Dorsalgia, unspecified: Secondary | ICD-10-CM

## 2012-12-23 MED ORDER — HYDROCODONE 5 MG-ACETAMINOPHEN 325 MG TABLET
2.0000 | ORAL_TABLET | Freq: Four times a day (QID) | ORAL | Status: AC | PRN
Start: 2012-12-23 — End: 2012-12-28

## 2012-12-23 MED ORDER — DIAZEPAM 5 MG TABLET
5.0000 mg | ORAL_TABLET | Freq: Once | ORAL | Status: AC
Start: 2012-12-23 — End: 2012-12-23
  Administered 2012-12-23: 5 mg via ORAL
  Filled 2012-12-23: qty 1

## 2012-12-23 MED ORDER — HYDROCODONE 5 MG-ACETAMINOPHEN 325 MG TABLET
1.0000 | ORAL_TABLET | Freq: Once | ORAL | Status: AC
Start: 2012-12-23 — End: 2012-12-23
  Administered 2012-12-23: 2 via ORAL
  Filled 2012-12-23: qty 2

## 2012-12-23 MED ORDER — ONDANSETRON HCL (PF) 4 MG/2 ML INJECTION SOLUTION
4.0000 mg | Freq: Once | INTRAMUSCULAR | Status: AC
Start: 2012-12-23 — End: 2012-12-23
  Administered 2012-12-23: 4 mg via INTRAVENOUS
  Filled 2012-12-23: qty 2

## 2012-12-23 MED ORDER — NACL 0.9% IV BOLUS - DURATION REQ
1000.0000 mL | Freq: Once | INTRAVENOUS | Status: AC
Start: 2012-12-23 — End: 2012-12-23
  Administered 2012-12-23: 1000 mL via INTRAVENOUS

## 2012-12-23 MED ORDER — MORPHINE 2 MG/ML INJECTION SYRINGE
2.0000 mg | INJECTION | Freq: Once | INTRAMUSCULAR | Status: DC
Start: 2012-12-23 — End: 2012-12-23

## 2012-12-23 MED ORDER — HYDROCODONE 5 MG-ACETAMINOPHEN 325 MG TABLET
1.0000 | ORAL_TABLET | Freq: Once | ORAL | Status: DC
Start: 2012-12-23 — End: 2012-12-23

## 2012-12-23 MED ORDER — DIAZEPAM 5 MG/ML INJECTION SYRINGE
5.0000 mg | INJECTION | Freq: Once | INTRAMUSCULAR | Status: AC
Start: 2012-12-23 — End: 2012-12-23
  Administered 2012-12-23: 5 mg via INTRAVENOUS
  Filled 2012-12-23: qty 2

## 2012-12-23 MED ORDER — MORPHINE 2 MG/ML INJECTION SYRINGE
2.0000 mg | INJECTION | INTRAMUSCULAR | Status: DC | PRN
Start: 2012-12-23 — End: 2012-12-23
  Administered 2012-12-23 (×4): 4 mg via INTRAVENOUS
  Administered 2012-12-23: 8 mg via INTRAVENOUS
  Filled 2012-12-23 (×3): qty 1
  Filled 2012-12-23: qty 2
  Filled 2012-12-23: qty 1

## 2012-12-23 NOTE — ED Nursing Note (Signed)
Pt. Dc. Home, vss, has a back brace in place, in NAD, has an rx for norco, will f/u w/nsx in two weeks, ambulated steadily to the dc window

## 2012-12-23 NOTE — ED Nursing Note (Signed)
Patient states that he cannot be discharged from the hospital without ankle brace and verbal/written notification from physician to his probation officer.     Parole officer's phone [phone], name is [employer].    Patient states numbing/aching/piercing pain in the lower back 10/10.     More pain medicine is given, see flowsheet.     All comfort and safety measures are observed.

## 2012-12-23 NOTE — ED Nursing Note (Signed)
Pt roomed in D8, here for lower body numbness since yesterday, hx of t12 fx and on back brace. Numbness starts hips and rads down legs has areas of numbness in part of right and left unequally. Resident at bedside eval pt.

## 2012-12-23 NOTE — ED Triage Note (Signed)
Pt states complete numbness to lower extremities.  States he does not have pmd.  Numbness starting yesterday.  States viding on himself.  Also with t-12 compound fx.  Arrives wearing brace.  Ambulated to triage.  States decreased sensation from lower back down. And pain radiating to spine.    States hurts to breath at times.  Speaking complete setences.  Awake and alert. NAD.

## 2012-12-23 NOTE — Consults (Addendum)
DEPARTMENT OF NEUROLOGICAL SURGERY  GENERAL CONSULTATION    Consulting Service:  ED    Date of Admission:   12/23/2012  2:41 PM Date of Service: 12/23/2012    Note Started: 12/23/2012, 18:47 Name of Requesting Attending: ED         REASON FOR CONSULTATION:  LBP, urinary incontinence    HISTORY OF PRESENT ILLNESS:  37yr old male s/p fall from height on from height approx 48ft when he was fixing an [facility] unit on a ladder, and landed on his buttocks with immediate pain in his lower back on 6/30 returns to ED complaining of acute onset of urinary incontinence and reduction of sensation along the suprapubic area since earlier today. Pt was evaluated in ED on 7/11 and 7/7 for persistent LBP and replacement of TLSO brace. CTL spine on both occassions revealed T12 burst fx without cord compression or disruption of ligamentous components and stable findings respectively. Pt was unable to obtain MRI on prior two admission due to ankle brace for parole tracking. Today, ankle brace was removed per ED staff and MR was obtained. Pt reports subjective complaint of weakness in his BLEs, and reductions of sensation from T10 down. He denies any tingling or numbness in his groin but reports reduction of discriminatory sensation in that region. He presents to ED for care when he experienced 4x urinary incontinence today without actual urgency to urinate.      HISTORY:  Patient Active Problem List    Diagnosis Date Noted    Back pain 12/17/2012    Stable burst fx of  T12 fracture 12/14/2012     Overview Note:     Patient sustained a stable T12 burst fracture and was seen and examined by the neurosurgery spine service who do not recommend surgical intervention for this type of fracture. It was recommended that he be fitted for a TLSO back brace which is to be worn while sitting upright and standing at all times for 4-6 weeks. His pain is controlled with PO narcotics and flexeril. He is to follow up with the neurosurgery spine clinic in 4  weeks or sooner if he is experiencing worsening of symptoms.       Fall 12/07/2012     Overview Note:     C-spine cleared.       Elbow contusion 11/28/2012    Contusion 10/31/2012    Back sprain 10/31/2012    Cough 10/06/2012    Abdominal pain 09/24/2012    Right medial knee pain 09/20/2012    Allergies   Allergen Reactions    Nsaids (Non-Steroidal Anti-Inflammatory Drug) Swelling      Past Medical History   Diagnosis Date    Unspecified mental or behavioral problem      bipolar    Unspecified asthma(493.90)      No past surgical history on file.   (Not in a hospital admission)   Social History     Occupational History    Not on file.     Social History Main Topics    Smoking status: Current Every Day Smoker -- 0.20 packs/day for 1.5 years     Types: Cigarettes    Smokeless tobacco: Not on file    Alcohol Use: No    Drug Use: Yes     Special: Methamphetamine    Sexually Active: Not on file    No family history on file.     There is no immunization history on file for this patient.  The patient's past medical, family, and social history was reviewed and confirmed.    ROS:  All other systems negative except as noted in the HPI.    VITAL SIGNS:  Vital Signs (Last Recorded):  Temp: 36.7 C (98.1 F) (12/23/12 1659)  Temp src: Oral  Pulse: 87  BP: 108/71 mmHg  Resp: 16  SpO2: 100 %        There is no height or weight on file to calculate BSA.  There is no weight on file to calculate BMI.    PHYSICAL EXAM:  General Appearance: Laying flat, NAD    Neuro Exam:   GCS: Eyes: 4 Verbal: 5 Motor: 6  Pupils: OD size: 2 mm, reaction: brisk             OS size: 2 mm, reaction: brisk  Cranial Nerves: 2-12 grossly intact EOMI, no facial asymmetry.    Motor:    Deltoid Biceps Triceps Wrist Flexion Wrist Extension Hand Intrinsics   Right 5 5 5 5 5 5    Left 5 5 5 5 5 5        Hip Flexion Knee Extension Knee Flexion Dorsiflexion Great Toe Extension Plantar Flexion   Right 4+ 5 5 5 5 5    Left 4+ 5 5 5 5 5      Reflexes:    Biceps Triceps Brachio-radialis Patellar Achilles Plantar Flexor Response   Right +2 +1 +2 +2 +1 down   Left +2 +1 +2 +2 +1 down   Rectal tone is intact with intact anal wink, however reduction of sensation to LT and pinprick at T10 suprapubic and groin area. Negative Hoffman and pectoralis.     Otherwise grossly intact    IMAGING:   MRI TL spine: T12 burst fx without overt indication of ligamentous injury, thecal sac abutment at T12 without gross cord compression. Stable findings compared to prior CT TL spine. No abnormal cord signal change on STIR    LAB TESTS / STUDIES:  I personally reviewed the following: labs, MRI and prior CT spine    Latest CMP  Recent labs for the past 48 hours     12/23/12 1349    SODIUM 140    POTASSIUM 3.8    CHLORIDE 106    CARBON DIOXIDE TOTAL 26    UREA NITROGEN, BLOOD (BUN) 13    CREATININE BLOOD 0.89    E-GFR, AFRICAN AMERICAN &gt;60    E-GFR, NON-AFRICAN AMERICAN &gt;60    GLUCOSE 105*    CALCIUM 9.4    PROTHROMBIN TIME --    ALBUMIN --    ALKALINE PHOSPHATASE (ALP) --    ALANINE TRANSFERASE (ALT) --     Latest CBC  Recent labs for the past 48 hours     12/23/12 1349    WHITE BLOOD CELL COUNT 8.2    HEMOGLOBIN 14.9    HEMATOCRIT 43.4    PLATELET COUNT 291     Latest INR / APTT    No results found for this basename: INR,APTT in the last 48 hours  Tox screen: NA  Blood alcohol: NA    ASSESSMENT: 37yr old male s/p fall from height returns to ED with subjective complaints of urinary incontinence, reduction of sensation to LT at suprapubic and groin area in the setting of LBP on MR revealed stable T12 burst fx without grossly cord compression. Neurological exam intact except for reduction to pinprick LT at groin and suprapubic region in the setting of intact rectal tone and anal  wink.     RECOMMENDATIONS:     No acute indications of emergent neurosurgical intervention.  Please obtain post void residual volume with bladder scan.   Please call [phone] after obtain PVR with  results.   Serial neuro exam  Pain control  Please continue TLSO for T12 burst fx.     Will dw staff with PVR results.     Lissa Hoard, M.D. PGY2  [place]-Neurosurgery  NSG Svc Pager: [phone]  Direct Pager [phone]  PI# [phone]    Neurosurgery Resident Addendum    DW chief resident as well as Dr. Selena Batten and images reviewed. No change in canal stenosis of fx comparing CT to MRI. Patient with intermittent incontinence and low back pain similar to previous presentations. PVR was low <50cc with 400cc out.   No indication for neurosurgical intervention or admission.  Patient will have PMD coverage beginning Aug1, would recommend rx for pain meds through this date to bridge between providers as he seems to be out and has no alternative source. He had good coverage on previous regimen.   Please have him follow up in the [facility] in the interim for further evaluation.   OK to DC to home from a neurosurgical perspective. Discussed with patient and he is amenable to this plan.    Lianne Cure, MD  [facility] Neurosurgery PGY-3  Service Pager [phone]  Pager [phone]  PI [phone]    I personally reviewed the image and agree with the resident's assessment and plan we developed.  Patient to f/u with me at the Spine Center.    Report electronically signed by Fernande Bras, MD. Attending

## 2012-12-23 NOTE — ED Progress Note (Signed)
Emergency Department Triage Note     Subjective: Carl Mata is a 37yr old male who presents to the ED with a chief complaint of lower extremity numbness for 2 days in the setting of T 12 fracture. Chart review:  Unable to do MR on last visit.    Patient's phone number confirmed - [phone]      Objective:    BP 142/62  Pulse 125  Temp(Src) 36.4 C (97.5 F) (Oral)  Resp 16  SpO2 98%     General:  No acute distress  HEENT:  Atraumatic  Neurologic:  Normal speech.  Normal tone.    Skin:  Dry    Plan:  Initial studies to evaluate this problem will include labs. Further evaluation will proceed in the Emergency Department.     Seen and initially evaluated by:  Rodney Langton, MD, Attending Physician, Department of Emergency Medicine       This patient was seen by a physician in triage. Please see the ED Initial Note for full details of this patient's care. This note covers the brief initial evaluation performed to obtain initial studies to expedite the patient's care. It is not intended to be a comprehensive document of the patient's ED visit.    This patient was instructed that this preliminary assessment and any studies obtained do not constitute a full workup of the patient's chief complaint. The patient was instructed to wait in the assigned area after the triage evaluation to be reevaluated by a physician after initial studies are completed.

## 2012-12-23 NOTE — Discharge Instructions (Signed)
 Please follow up with [facility] Neurosurgery in 2 weeks as discussed. Wear your back brace as previously instructed. Return to the emergency department if you develop worsening leg weakness, worsening numbness, uncontrolled back pain, urinary incontinence/failure to urinate, or if you have any acute medical concerns.

## 2012-12-23 NOTE — ED Nursing Note (Signed)
Pt. Still has various numbness spots to BLE (ant. Hip bilat, heel area bilat.), awaiting MRI, PIV started, pain medicine admitted

## 2012-12-23 NOTE — ED Initial Note (Signed)
EMERGENCY DEPARTMENT PHYSICIAN NOTE - Carl Mata       Date of Service:   12/23/2012  2:41 PM Patient's PCP: No Pcp No Pcp   Note Started: 12/23/2012 16:06 DOB: [birth year]             Chief Complaint   Patient presents with    Numbness     t-12 fx 4 weeks ago.            The history provided by the patient.  Interpreter used: No    Carl Mata is a 37yr old male, with a past medical history significant for T12 burst fracture 6 weeks ago, who presents to the ED with a chief complaint of bilateral leg numbness and urinary incontinence that began yesterday. Patient states he sustained a T12 fracture after falling off of a ladder and had mostly been only having back pain for the past few weeks. Since yesterday, his bilateral legs have had decreased sensation and he urinated on himself 4 times. He does report that his legs have seemed to get more weak over the course of the last few weeks. Denies any new trauma or injury. No fever or chills. Denies IVDA.  States his back pain has been severe, as well.    Quality: Throbbing  Location: Mid-back  Severity: 10/10  Time Course: worsening  Progression: gradually worsening  Duration: As abovee  Palliative factors: laying down makes it better.  Provocative factors: walking and standing makes it worse.  Associated symptoms: As above  Pertinent negatives: As above     A full history, including pertinent past medical, family and social history was reviewed.    HISTORY:  There are no hospital problems to display for this patient.   Allergies   Allergen Reactions    Nsaids (Non-Steroidal Anti-Inflammatory Drug) Swelling      Past Medical History:    Unspecified mental or behavioral problem                      Unspecified asthma(493.90)                                 No past surgical history on file.   Social History    Marital Status: SINGLE              Spouse Name:                       Years of Education:                 Number of children:               Occupational  History    None on file    Social History Main Topics    Smoking Status: Current Every Day Smoker        Packs/Day: 0.20  Years: 1.5       Types: Cigarettes    Smokeless Status: Not on file                       Alcohol Use: No              Drug Use: Yes                Special: Methamphetamine    Sexual Activity: Not on file  Other Topics            Concern    None on file    Social History Narrative    None on file     No family history on file.             Review of Systems   Constitutional: Negative for fever and chills.   HENT: Negative for congestion, sore throat and tinnitus.    Eyes: Negative for photophobia.   Respiratory: Negative for cough and shortness of breath.    Cardiovascular: Negative for chest pain and palpitations.   Gastrointestinal: Negative for nausea, vomiting and abdominal pain.   Genitourinary: Negative for hematuria.        Urinary incontinence    Musculoskeletal: Positive for back pain. Negative for gait problem.   Skin: Negative for rash and wound.   Neurological: Positive for weakness and numbness.   All other systems reviewed and are negative.        TRIAGE VITAL SIGNS:  Temp: 36.4 C (97.5 F) (12/23/12 1331)  Temp src: Oral (12/23/12 1331)  Pulse: 125 (12/23/12 1331)  BP: 142/62 mmHg (12/23/12 1331)  Resp: 16 (12/23/12 1331)  SpO2: 98 % (12/23/12 1331)  Weight: (not recorded)    Physical Exam   Nursing note and vitals reviewed.  Constitutional: He is oriented to person, place, and time. He appears well-developed and well-nourished. No distress.   HENT:   Head: Normocephalic and atraumatic.   Eyes: Right eye exhibits no discharge. Left eye exhibits no discharge. No scleral icterus.   Neck: Normal range of motion.   Cardiovascular: Normal rate, regular rhythm and normal heart sounds.    Pulmonary/Chest: Effort normal and breath sounds normal. No stridor. No respiratory distress. He has no wheezes. He has no rales.   Abdominal: Soft. He exhibits no distension. There is no  tenderness.   Musculoskeletal:   Point tenderness to lower midline t-spine.    Neurological: He is alert and oriented to person, place, and time.   Upper extremity strength 5/5.   Lower extremity: 4/5 hip flexor strength bilaterally, otherwise 5/5 lower extremity strength bilaterally.  Sensation: Decreased sensation bilaterally from umbilicus down to groin. Decreased sensation bilateral anterior thighs, left > right. Decreased sensation medial foot, right > left. Dorsal and plantar feet otherwise with symmetric decreased sensation.   Reflexes: 2+ DTRs patella and Achilles bilaterally.   Rectal tone: Slightly decreased.            INITIAL ASSESSMENT & PLAN, MEDICAL DECISION MAKING, ED COURSE:  Carl Mata is a 37yr old male who presents with a chief complaint of back pain, lower extremity sensation. After history and exam, I feel the diagnosis is most likely back pain from burst fracture. Differential diagnosis includes, but is not limited to cauda equina syndrome, nerve root impingement, spinal cord compression.  The patient is hemodynamically stable and will require anaglesia as an immediate intervention. Initial treatment and studies to evaluate this problem will include serial exams and labs, imaging, neurosurgical consultation.         ED Course Update: The patient was observed in the ED. The results of the ED evaluation were notable for the following:    Pertinent lab results (reviewed and interpreted independently by me): CBC, BMP unremarkable.  Pertinent imaging results (reviewed and interpreted independently by me): MRI T/L spine shows stable T12 compression fx.   Pertinent medications: Valium, norco, morphine, zofran given for pain.   Chart Review: I reviewed  the patient's prior medical records. Pertinent information that is relevant to this encounter: Prior burst fracture at T12. Patient has also been here for similar symptoms in the past 2 weeks.     Consults: Patient case discussed with neurosurgical  team.     16:53 Discussed with parole officer Ruthann Cancer the need to remove ankle bracelet for MRI. He states it is ok to cut the ankle bracelet off since it can't be easily removed and to have it sent home with patient as best taped up as possible. He will see patient in the am.    Patient with MRI showing stable T12 fracture. Neurosurgery saw and examined the patient. They requested a post-void residual. Patient  Was able to void 400 cc of urine and had minimal (3cc) urine left on bedside ultrasound. Patient was seen walking around in the hospital without difficulty.    His parole officer was notified at time of patient discharge. He stated to try and tape ankle bracelet as best as possible.     Further MDM  Additional data reviewed? YES  Chart Review: I reviewed the patient's prior medical records. Pertinent information that is relevant to this encounter : Prior imagine results.        Interventions:   IV morphine, IV zofran  Norco, Valium for pain     ED Course and patient summary: 37 y/o M with history of T12 burst fracture who presented with paresthesias. He was found to have a stable burst fx on MRI and was able to ambulate while in ED without difficulty and had continence of urine here. He was seen by neurosurgery and they note he is stable for discharge, will see him in clinic in 2 weeks. Patient d/c'd home with script for pain medications and strict return precautions in case of increased weakness, numbness, incontinence, uncontrolled back pain.         LAST VITAL SIGNS:  Temp: 36.4 C (97.5 F) (12/23/12 1331)  Temp src: Oral (12/23/12 1331)  Pulse: 125 (12/23/12 1331)  BP: 142/62 mmHg (12/23/12 1331)  Resp: 16 (12/23/12 1331)  SpO2: 98 % (12/23/12 1331)  Weight: (not recorded)      Clinical Impression: Stable T12 burst fracture, back pain       Anticipated work up needed: Follow up with neurosurgery as scheduled.       Disposition: Discharge. Follow up with neurosurgery. ED discharge instructions  were reviewed and provided. It was discussed that radiology reports are preliminary and the patient will be contacted for any changes in interpretation.        MDM COMPLEXITY  Overall Complexity of MDM is: high          PRESENT ON ADMISSION:  Are any of the following four conditions present or suspected on admission: decubitus ulcer, infection from an intravascular device, infection due to an indwelling catheter, surgical site infection or pneumonia? No.    PATIENT'S GENERAL CONDITION:  Fair: Vital signs are stable and within normal limits. Patient is conscious but may be uncomfortable. Indicators are favorable.      Report electronically signed by:   Alcario Drought, MD  Emergency Medicine, PGY-1   Pager: [phone]       This patient was seen, evaluated, and care plan was developed with the resident.  I agree with the findings and plan as outlined in our combined note.         Electronically signed by:  Retta Diones, MD, Associate Professor, Department of Emergency  Medicine

## 2013-01-09 ENCOUNTER — Emergency Department
Admission: EM | Admit: 2013-01-09 | Discharge: 2013-01-09 | Disposition: A | Discharge status: Home / Self Care | Payer: MEDICAID | Attending: Emergency Medicine | Admitting: Emergency Medicine

## 2013-01-09 DIAGNOSIS — M549 Dorsalgia, unspecified: Secondary | ICD-10-CM | POA: Insufficient documentation

## 2013-01-09 DIAGNOSIS — G8929 Other chronic pain: Secondary | ICD-10-CM | POA: Insufficient documentation

## 2013-01-09 DIAGNOSIS — S22042D Unstable burst fracture of fourth thoracic vertebra, subsequent encounter for fracture with routine healing: Secondary | ICD-10-CM | POA: Insufficient documentation

## 2013-01-09 MED ORDER — HYDROCODONE 10 MG-ACETAMINOPHEN 325 MG TABLET
1.0000 | ORAL_TABLET | Freq: Once | ORAL | Status: AC
Start: 2013-01-09 — End: 2013-01-09
  Administered 2013-01-09: 1 via ORAL
  Filled 2013-01-09: qty 1

## 2013-01-09 NOTE — Discharge Instructions (Signed)
 Establish with PCP for chronic pain.

## 2013-01-09 NOTE — ED Initial Note (Signed)
EMERGENCY DEPARTMENT PHYSICIAN NOTE - Carl Mata       Date of Service:   01/09/2013  9:40 AM Patient's PCP: No Pcp No Pcp   Note Started: 01/09/2013 10:41 DOB: Feb 10, 1976             Chief Complaint   Patient presents with    Back Pain Chronic       The history provided by the patient.  Interpreter used: No    Carl Mata is a 37yr old male, with a past medical history significant for stable burst fx ofT12 fracture, who presents to the ED with a chief complaint of back pain that began yesterday.  Pt reports initial injury 8 month ago from falling from a scaffold, landing on his tailbone, and was admitted to hospital, diagnosed with stable burst fx ofT12 fracture. He has had constant back pain since then with no new trauma. Yesterday, after pt worked around house, pain began to increase. Can hear popping with movements of torso. Pt reports pain is throbbing/numbing/stinging which is constant, 8/10, and radiating down his BLEs and up his back.  Pt took tylenol with no alleviation of pain, but lying down helps to alleviate pain.  Pt notes constipation for 2 days, with no incontinence.  He endorses mild photophobia and headache x 20 minutes.  Pt denies fevers, chills, ear pain, stomach pain, trouble swallowing, nausea, vomiting, or any other medical complaints at this time.     A full history, including pertinent past medical, family and social history was reviewed.    HISTORY:  There are no hospital problems to display for this patient.   Allergies   Allergen Reactions    Nsaids (Non-Steroidal Anti-Inflammatory Drug) Swelling      Past Medical History:    Unspecified mental or behavioral problem                      Unspecified asthma(493.90)                                 No past surgical history on file.   Social History    Marital Status: SINGLE              Spouse Name:                       Years of Education:                 Number of children:               Occupational History    None on  file    Social History Main Topics    Smoking Status: Current Every Day Smoker        Packs/Day: 0.20  Years: 1.5       Types: Cigarettes    Smokeless Status: Not on file                       Alcohol Use: No              Drug Use: Yes                Special: Methamphetamine    Sexual Activity: Not on file          Other Topics            Concern  None on file    Social History Narrative    None on file     No family history on file.             Review of Systems   Constitutional: Negative for fever and chills.   Gastrointestinal: Negative for vomiting and diarrhea.   All other systems reviewed and are negative.        TRIAGE VITAL SIGNS:  Temp: 36.5 C (97.7 F) (01/09/13 0939)  Temp src: Oral (01/09/13 0939)  Pulse: 98 (01/09/13 0939)  BP: 105/40 mmHg (01/09/13 0939)  Resp: 18 (01/09/13 0939)  SpO2: 98 % (01/09/13 0939)  Weight: (not recorded)    Physical Exam   Nursing note and vitals reviewed.  Constitutional: He is oriented to person, place, and time. He appears well-developed and well-nourished. No distress.   HENT:   Head: Normocephalic and atraumatic.   Right Ear: External ear normal.   Left Ear: External ear normal.   Mouth/Throat: Oropharynx is clear and moist.   Eyes: Conjunctivae and EOM are normal. Pupils are equal, round, and reactive to light.   Neck: Normal range of motion. Neck supple.   Cardiovascular: Normal rate, regular rhythm and normal heart sounds.  Exam reveals no gallop and no friction rub.    No murmur heard.  Pulses:       Posterior tibial pulses are 2+ on the right side, and 2+ on the left side.   Pulmonary/Chest: Effort normal and breath sounds normal. No stridor. No respiratory distress. He has no wheezes. He has no rales.   Abdominal: Soft. Bowel sounds are normal. He exhibits no distension. There is no tenderness. There is no rebound and no guarding.   Musculoskeletal:   Subjective decreased sensation LLE  Pain with LLE raising  No paraspinal TTP B/L  C1-T8 midline TTP    Neurological: He is alert and oriented to person, place, and time.   Skin: He is not diaphoretic.       INITIAL ASSESSMENT & PLAN, MEDICAL DECISION MAKING, ED COURSE:  Carl Mata is a 37yr old male who presents with a chief complaint of back pain. After history and exam, I feel the diagnosis is most likely paraspinal muscle pain. Differential diagnosis includes, but is not limited to vertebral fracture, neurologic impingement. The patient is hemodynamically stable and will require monitoring as an immediate intervention. Initial treatment and studies to evaluate this problem will include meds.     ED Course Update: The patient was observed in the ED. The results of the ED evaluation were notable for the following:    Pertinent medications:   Hydrocodone 10 mg/Acetaminophen 325 mg (NORCO 10) Tablet 1 tablet    Further MDM  Additional data reviewed? N/A    ED Course:   10:41 Patient was seen and evaluated.   11:13 Patient rechecked and evaluated by Salomon Mast, MD - Attending Physician.   11:18 Patient given discharge instructions    Patient Summary: Carl Mata is a 37yr old male presenting to the ED today with back pain. He reported chronic back pain since and fx 1 month ago, but pain got worse yesterday. There has been no new trauma since original fx, and he had no stepoffs on exam. Given level of pain I did not feel imaging was merited. Patient treated here with Norco with symptomatic improvement. Pt told no further refills from the ED. All narcotics must be obtained from clinic.  Patient to be discharged. Instructed to  follow-up with PCP in 1 week. All questions answered, return precautions discussed.    LAST VITAL SIGNS:  Temp: 36.5 C (97.7 F) (01/09/13 0939)  Temp src: Oral (01/09/13 0939)  Pulse: 98 (01/09/13 0939)  BP: 105/40 mmHg (01/09/13 0939)  Resp: 18 (01/09/13 0939)  SpO2: 98 % (01/09/13 0939)  Weight: (not recorded)      Clinical Impression:  1. Chronic back pain  2. Stable burst fx of T12  fx        Anticipated work up needed: PCP      Disposition: Discharge. Follow up with PCP. ED discharge instructions were reviewed and provided. It was discussed that radiology reports are preliminary and the patient will be contacted for any changes in interpretation.    MDM COMPLEXITY  Overall Complexity of MDM is: low    PATIENT'S GENERAL CONDITION:  Good: Vital signs are stable and within normal limits. Patient is conscious and comfortable. Indicators are excellent.     DISCLAIMER:   This document serves as my personal record of services taken in my presence. It was created on 01/09/2013 on my behalf by Aundria Rud, a trained medical scribe.     I have reviewed this document and agree that this note accurately reflects the history and exam findings, the patient care provided, and my medical decision making.    This patient was seen, evaluated, and the care plan was developed in conjunction with Ferne Reus, MD - Resident Physician. I agree with the findings and plan as outlined in our combined note.      01/09/2013  Electronically Signed By: Electronically signed by Raphael Gibney. Midori Dado MD Attending Physician    Attending Physician, Department of Emergency Medicine   [facility]

## 2013-01-09 NOTE — ED Nursing Note (Signed)
VSS. Pt awaiting D/C instructions

## 2013-01-09 NOTE — ED Triage Note (Signed)
Pt ambulated to triage, reports has T12 fx dx 8 weeks ago. States a few days ago, pain "got worse" states pain feels like it is "shooting" down back. NAD

## 2013-04-04 ENCOUNTER — Encounter
Admission: EM | Discharge status: Against Medical Advice | Payer: Self-pay | Source: Emergency Department | Attending: Neurological Surgery

## 2013-04-04 ENCOUNTER — Inpatient Hospital Stay (HOSPITAL_COMMUNITY): Payer: MEDICAID

## 2013-04-04 ENCOUNTER — Inpatient Hospital Stay
Admission: EM | Admit: 2013-04-04 | Discharge: 2013-04-09 | DRG: 460 | Discharge status: Against Medical Advice | Payer: MEDICAID | Attending: Neurological Surgery | Admitting: Neurological Surgery

## 2013-04-04 ENCOUNTER — Emergency Department (EMERGENCY_DEPARTMENT_HOSPITAL): Payer: MEDICAID

## 2013-04-04 ENCOUNTER — Inpatient Hospital Stay: Payer: MEDICAID

## 2013-04-04 ENCOUNTER — Inpatient Hospital Stay (HOSPITAL_COMMUNITY): Payer: MEDICAID | Admitting: Certified Registered"

## 2013-04-04 ENCOUNTER — Encounter: Payer: Self-pay | Admitting: Certified Registered"

## 2013-04-04 DIAGNOSIS — M40299 Other kyphosis, site unspecified: Secondary | ICD-10-CM | POA: Diagnosis present

## 2013-04-04 DIAGNOSIS — R32 Unspecified urinary incontinence: Secondary | ICD-10-CM | POA: Diagnosis present

## 2013-04-04 DIAGNOSIS — R209 Unspecified disturbances of skin sensation: Secondary | ICD-10-CM | POA: Diagnosis present

## 2013-04-04 DIAGNOSIS — W19XXXS Unspecified fall, sequela: Secondary | ICD-10-CM | POA: Diagnosis present

## 2013-04-04 DIAGNOSIS — J45909 Unspecified asthma, uncomplicated: Secondary | ICD-10-CM | POA: Diagnosis present

## 2013-04-04 DIAGNOSIS — Z888 Allergy status to other drugs, medicaments and biological substances status: Secondary | ICD-10-CM | POA: Diagnosis not present

## 2013-04-04 DIAGNOSIS — F17200 Nicotine dependence, unspecified, uncomplicated: Secondary | ICD-10-CM | POA: Diagnosis present

## 2013-04-04 DIAGNOSIS — IMO0002 Reserved for concepts with insufficient information to code with codable children: Secondary | ICD-10-CM | POA: Diagnosis not present

## 2013-04-04 DIAGNOSIS — S22042D Unstable burst fracture of fourth thoracic vertebra, subsequent encounter for fracture with routine healing: Secondary | ICD-10-CM | POA: Insufficient documentation

## 2013-04-04 DIAGNOSIS — G8929 Other chronic pain: Secondary | ICD-10-CM | POA: Diagnosis present

## 2013-04-04 DIAGNOSIS — R109 Unspecified abdominal pain: Secondary | ICD-10-CM

## 2013-04-04 DIAGNOSIS — F1510 Other stimulant abuse, uncomplicated: Secondary | ICD-10-CM | POA: Diagnosis present

## 2013-04-04 DIAGNOSIS — M4804 Spinal stenosis, thoracic region: Principal | ICD-10-CM | POA: Diagnosis present

## 2013-04-04 DIAGNOSIS — G834 Cauda equina syndrome: Secondary | ICD-10-CM | POA: Diagnosis present

## 2013-04-04 DIAGNOSIS — F319 Bipolar disorder, unspecified: Secondary | ICD-10-CM | POA: Diagnosis present

## 2013-04-04 HISTORY — PX: LAMINECTOMY, THORACIC, POSTERIOR APPROACH: SHX000974

## 2013-04-04 HISTORY — PX: FUSION, SPINE, THORACIC, USING POSTERIOR TECHNIQUE: SHX000762

## 2013-04-04 SURGERY — FUSION, SPINE, THORACIC, USING POSTERIOR TECHNIQUE
Anesthesia: General | Site: Spine Thoracic | Wound class: Clean

## 2013-04-04 MED ORDER — LIDOCAINE HCL 10 MG/ML (1 %) INJECTION SOLUTION
0.1000 mL | INTRAMUSCULAR | Status: DC | PRN
Start: 2013-04-04 — End: 2013-04-05
  Administered 2013-04-05: 0.5 mL via INTRADERMAL

## 2013-04-04 MED ORDER — LACTATED RINGERS IV INFUSION
INTRAVENOUS | Status: DC | PRN
Start: 2013-04-04 — End: 2013-04-04
  Administered 2013-04-04: 16:00:00 via INTRAVENOUS

## 2013-04-04 MED ORDER — NACL 0.9% IV INFUSION
INTRAVENOUS | Status: DC | PRN
Start: 2013-04-04 — End: 2013-04-04
  Administered 2013-04-04: 16:00:00 via INTRAVENOUS

## 2013-04-04 MED ORDER — ROCURONIUM 100 MG/10 ML (10 MG/ML) INTRAVENOUS SYRINGE
INJECTION | INTRAVENOUS | Status: DC | PRN
Start: 2013-04-04 — End: 2013-04-04
  Administered 2013-04-04: 60 mg via INTRAVENOUS

## 2013-04-04 MED ORDER — POTASSIUM CHLORIDE 20 MEQ/15 ML ORAL LIQUID
20.0000 meq | ORAL | Status: DC | PRN
Start: 2013-04-04 — End: 2013-04-09

## 2013-04-04 MED ORDER — LIDOCAINE HCL 20 MG/ML (2 %) INJECTION SOLUTION
INTRAMUSCULAR | Status: DC | PRN
Start: 2013-04-04 — End: 2013-04-04
  Administered 2013-04-04: 3 mL via INTRAVENOUS

## 2013-04-04 MED ORDER — ACETAMINOPHEN 1,000 MG/100 ML (10 MG/ML) INTRAVENOUS SOLUTION
INTRAVENOUS | Status: DC | PRN
Start: 2013-04-04 — End: 2013-04-04
  Administered 2013-04-04: 1000 mg via INTRAVENOUS

## 2013-04-04 MED ORDER — HYDROMORPHONE 1 MG/ML INJECTION SYRINGE
1.0000 mg | INJECTION | INTRAMUSCULAR | Status: DC | PRN
Start: 2013-04-04 — End: 2013-04-06
  Administered 2013-04-04 (×2): 1 mg via INTRAVENOUS
  Administered 2013-04-04: 0.6 mg via INTRAVENOUS
  Administered 2013-04-05 – 2013-04-06 (×8): 1 mg via INTRAVENOUS
  Filled 2013-04-04 (×10): qty 1

## 2013-04-04 MED ORDER — GELATIN SPONGE,ABSORBABLE-PORCINE SKIN 100 TOPICAL SPONGE
VAGINAL_SPONGE | TOPICAL | Status: DC | PRN
Start: 2013-04-04 — End: 2013-04-04
  Administered 2013-04-04: 16:00:00 via TOPICAL

## 2013-04-04 MED ORDER — INTRAOP BUPIVACAINE 0.25%-EPI 1:200,000 PF INJ 30 ML VIAL
Status: DC | PRN
Start: 2013-04-04 — End: 2013-04-04
  Administered 2013-04-04: 17:00:00

## 2013-04-04 MED ORDER — FENTANYL (PF) 50 MCG/ML INJECTION SOLUTION
25.0000 ug | INTRAMUSCULAR | Status: DC | PRN
Start: 2013-04-04 — End: 2013-04-04
  Filled 2013-04-04: qty 2

## 2013-04-04 MED ORDER — BISACODYL 10 MG RECTAL SUPPOSITORY
10.0000 mg | RECTAL | Status: DC | PRN
Start: 2013-04-04 — End: 2013-04-09

## 2013-04-04 MED ORDER — HYDROMORPHONE 1 MG/ML INJECTION SYRINGE
0.2000 mg | INJECTION | INTRAMUSCULAR | Status: DC | PRN
Start: 2013-04-04 — End: 2013-04-04
  Administered 2013-04-04: 0.4 mg via INTRAVENOUS
  Filled 2013-04-04: qty 1

## 2013-04-04 MED ORDER — DOCUSATE SODIUM 100 MG CAPSULE
100.0000 mg | ORAL_CAPSULE | Freq: Two times a day (BID) | ORAL | Status: DC
Start: 2013-04-04 — End: 2013-04-09
  Administered 2013-04-05 – 2013-04-09 (×9): 100 mg via ORAL
  Filled 2013-04-04 (×9): qty 1

## 2013-04-04 MED ORDER — FENTANYL (PF) 50 MCG/ML INJECTION SOLUTION
INTRAMUSCULAR | Status: DC | PRN
Start: 2013-04-04 — End: 2013-04-04
  Administered 2013-04-04 (×3): 100 ug via INTRAVENOUS
  Administered 2013-04-04: 50 ug via INTRAVENOUS

## 2013-04-04 MED ORDER — POTASSIUM CHLORIDE 20 MEQ/L IN 0.9 % SODIUM CHLORIDE INTRAVENOUS
INTRAVENOUS | Status: DC
Start: 2013-04-04 — End: 2013-04-09
  Administered 2013-04-04 – 2013-04-07 (×4): via INTRAVENOUS

## 2013-04-04 MED ORDER — HYDROMORPHONE 1 MG/ML INJECTION SYRINGE
INJECTION | INTRAMUSCULAR | Status: DC | PRN
Start: 2013-04-04 — End: 2013-04-04
  Administered 2013-04-04 (×2): 1 mg via INTRAVENOUS

## 2013-04-04 MED ORDER — HYDROMORPHONE 50 MG/50 ML (1 MG/ML) IN 0.9 % SOD.CHLORIDE IV PUMP RESV
INTRAVENOUS | Status: DC
Start: 2013-04-05 — End: 2013-04-07
  Administered 2013-04-04 – 2013-04-05 (×2): via INTRAVENOUS
  Filled 2013-04-04 (×2): qty 50

## 2013-04-04 MED ORDER — DIAZEPAM 5 MG/ML INJECTION SYRINGE
2.5000 mg | INJECTION | INTRAMUSCULAR | Status: DC | PRN
Start: 2013-04-04 — End: 2013-04-08
  Administered 2013-04-05 – 2013-04-07 (×3): 2.5 mg via INTRAVENOUS
  Filled 2013-04-04 (×3): qty 2

## 2013-04-04 MED ORDER — LACTATED RINGERS IV INFUSION
INTRAVENOUS | Status: DC
Start: 2013-04-04 — End: 2013-04-04

## 2013-04-04 MED ORDER — HYDROCODONE 5 MG-ACETAMINOPHEN 325 MG TABLET
1.0000 | ORAL_TABLET | ORAL | Status: DC | PRN
Start: 2013-04-04 — End: 2013-04-08
  Administered 2013-04-05: 2 via ORAL
  Administered 2013-04-05 – 2013-04-06 (×4): 1 via ORAL
  Administered 2013-04-06: 2 via ORAL
  Administered 2013-04-06: 1 via ORAL
  Administered 2013-04-07 (×2): 2 via ORAL
  Administered 2013-04-07: 1 via ORAL
  Administered 2013-04-08 (×3): 2 via ORAL
  Filled 2013-04-04 (×2): qty 2
  Filled 2013-04-04 (×2): qty 1
  Filled 2013-04-04 (×3): qty 2
  Filled 2013-04-04 (×4): qty 1
  Filled 2013-04-04 (×3): qty 2

## 2013-04-04 MED ORDER — ONDANSETRON HCL (PF) 4 MG/2 ML INJECTION SOLUTION
INTRAMUSCULAR | Status: DC | PRN
Start: 2013-04-04 — End: 2013-04-04
  Administered 2013-04-04: 4 mg via INTRAVENOUS

## 2013-04-04 MED ORDER — REMOVE LIDOCAINE CREAM
Status: DC | PRN
Start: 2013-04-04 — End: 2013-04-05
  Filled 2013-04-04: qty 1

## 2013-04-04 MED ORDER — IPRATROPIUM 0.5 MG-ALBUTEROL 3 MG (2.5 MG BASE)/3 ML NEBULIZATION SOLN
3.0000 mL | INHALATION_SOLUTION | RESPIRATORY_TRACT | Status: DC | PRN
Start: 2013-04-04 — End: 2013-04-04

## 2013-04-04 MED ORDER — INTRAOP DEXAMETHASONE 4 MG/ML INJ 5 ML VIAL
Status: DC | PRN
Start: 2013-04-04 — End: 2013-04-04
  Administered 2013-04-04: 10 mg via INTRAVENOUS

## 2013-04-04 MED ORDER — ONDANSETRON HCL (PF) 4 MG/2 ML INJECTION SOLUTION
4.0000 mg | INTRAMUSCULAR | Status: DC | PRN
Start: 2013-04-04 — End: 2013-04-04

## 2013-04-04 MED ORDER — CEFAZOLIN 2 GRAM/100 ML IN 0.9 % SODIUM CHLORIDE INTRAVENOUS SOLUTION
2.0000 g | INTRAVENOUS | Status: AC
Start: 2013-04-04 — End: 2013-04-04
  Administered 2013-04-04: 2 g via INTRAVENOUS

## 2013-04-04 MED ORDER — MAGNESIUM HYDROXIDE 400 MG/5 ML ORAL SUSPENSION
30.0000 mL | Freq: Two times a day (BID) | ORAL | Status: DC | PRN
Start: 2013-04-04 — End: 2013-04-09

## 2013-04-04 MED ORDER — MORPHINE 2 MG/ML INJECTION SYRINGE
4.0000 mg | INJECTION | Freq: Once | INTRAMUSCULAR | Status: AC
Start: 2013-04-04 — End: 2013-04-04
  Administered 2013-04-04: 4 mg via INTRAVENOUS
  Filled 2013-04-04: qty 1

## 2013-04-04 MED ORDER — POLYETHYLENE GLYCOL 3350 17 GRAM ORAL POWDER PACKET
17.0000 g | Freq: Every day | ORAL | Status: DC
Start: 2013-04-05 — End: 2013-04-09
  Administered 2013-04-07 – 2013-04-09 (×3): 17 g via ORAL
  Filled 2013-04-04 (×3): qty 1

## 2013-04-04 MED ORDER — LIDOCAINE 4 % TOPICAL CREAM
TOPICAL_CREAM | TOPICAL | Status: DC | PRN
Start: 2013-04-04 — End: 2013-04-05

## 2013-04-04 MED ORDER — MAGNESIUM SULFATE 2 GRAM/50 ML (4 %) IN WATER INTRAVENOUS PIGGYBACK
2.0000 g | INJECTION | INTRAVENOUS | Status: DC | PRN
Start: 2013-04-04 — End: 2013-04-09

## 2013-04-04 MED ORDER — MAGNESIUM OXIDE 400 MG (241.3 MG MAGNESIUM) TABLET
800.0000 mg | ORAL_TABLET | ORAL | Status: DC | PRN
Start: 2013-04-04 — End: 2013-04-09

## 2013-04-04 MED ORDER — SENNOSIDES 8.6 MG TABLET
2.0000 | ORAL_TABLET | Freq: Every day | ORAL | Status: DC
Start: 2013-04-04 — End: 2013-04-09
  Administered 2013-04-05 – 2013-04-08 (×4): 17.2 mg via ORAL
  Filled 2013-04-04 (×4): qty 2

## 2013-04-04 MED ORDER — PHENYLEPHRINE (PF) 1 MG/10 ML (100 MCG/ML) IN 0.9 % NACL IV SYRINGE
INJECTION | INTRAVENOUS | Status: DC | PRN
Start: 2013-04-04 — End: 2013-04-04
  Administered 2013-04-04 (×4): 50 ug via INTRAVENOUS

## 2013-04-04 MED ORDER — PROPOFOL BOLUS - PEDS
INTRAVENOUS | Status: DC | PRN
Start: 2013-04-04 — End: 2013-04-04
  Administered 2013-04-04: 200 mg via INTRAVENOUS

## 2013-04-04 MED ORDER — ONDANSETRON HCL (PF) 4 MG/2 ML INJECTION SOLUTION
4.0000 mg | Freq: Three times a day (TID) | INTRAMUSCULAR | Status: DC | PRN
Start: 2013-04-04 — End: 2013-04-09
  Administered 2013-04-06: 4 mg via INTRAVENOUS
  Filled 2013-04-04: qty 2

## 2013-04-04 MED ORDER — MIDAZOLAM (PF) 1 MG/ML INJECTION SOLUTION
INTRAMUSCULAR | Status: DC | PRN
Start: 2013-04-04 — End: 2013-04-04
  Administered 2013-04-04: 2 mg via INTRAVENOUS

## 2013-04-04 MED ORDER — CALCIUM 500 MG (AS CALCIUM CARBONATE 1,250 MG) TABLET
2500.0000 mg | ORAL_TABLET | ORAL | Status: DC | PRN
Start: 2013-04-04 — End: 2013-04-09

## 2013-04-04 MED ORDER — INTRAOP PROPOFOL 10 MG/ML INJ 100 ML VIAL
Status: DC | PRN
Start: 2013-04-04 — End: 2013-04-04
  Administered 2013-04-04: 100 ug/kg/min via INTRAVENOUS

## 2013-04-04 MED ORDER — INTRAOP NACL 0.9% 1000 ML IRRIGATION BOTTLE
Status: DC | PRN
Start: 2013-04-04 — End: 2013-04-04
  Administered 2013-04-04: 19:00:00

## 2013-04-04 MED ORDER — NALOXONE 0.4 MG/ML INJECTION SOLUTION
0.1000 mg | INTRAMUSCULAR | Status: DC | PRN
Start: 2013-04-04 — End: 2013-04-09

## 2013-04-04 MED ORDER — DOPAMINE 400 MG/250 ML (1,600 MCG/ML) IN 5 % DEXTROSE INTRAVENOUS SOLN
2.0000 ug/kg/min | INTRAVENOUS | Status: DC
Start: 2013-04-04 — End: 2013-04-07
  Administered 2013-04-04: 2.0103 ug/kg/min via INTRAVENOUS
  Administered 2013-04-05 (×2): 20 ug/kg/min via INTRAVENOUS
  Administered 2013-04-05: 18 ug/kg/min via INTRAVENOUS
  Administered 2013-04-05: 20 ug/kg/min via INTRAVENOUS
  Administered 2013-04-06 (×3): 19 ug/kg/min via INTRAVENOUS
  Administered 2013-04-06 – 2013-04-07 (×2): 20 ug/kg/min via INTRAVENOUS
  Filled 2013-04-04 (×13): qty 250

## 2013-04-04 MED ORDER — NEOSTIGMINE METHYLSULFATE 1 MG/ML INJECTION SOLUTION
INTRAMUSCULAR | Status: DC | PRN
Start: 2013-04-04 — End: 2013-04-11
  Administered 2013-04-04: 2 mg via INTRAVENOUS

## 2013-04-04 MED ORDER — GLYCOPYRROLATE 0.2 MG/ML INJECTION SOLUTION
INTRAMUSCULAR | Status: DC | PRN
Start: 2013-04-04 — End: 2013-04-11
  Administered 2013-04-04: .4 mg via INTRAVENOUS

## 2013-04-04 MED ORDER — CALCIUM GLUCONATE 3 GRAM/100 ML IN 0.9% SODIUM CHLORIDE IV
3000.0000 mg | INTRAVENOUS | Status: DC | PRN
Start: 2013-04-04 — End: 2013-04-09

## 2013-04-04 SURGICAL SUPPLY — 40 items
BONE CHIPS CANCELLOUS 1-4MM 15CC 00500618 (Bone) ×2 IMPLANT
BONE CHIPS CANCELLOUS 1-4MM 30CC 00500718 (Bone) ×2 IMPLANT
CATHETER FOLEY 16F TEMPERATURE SENSOR WITH URIMETER KIT (Catheter) IMPLANT
CAUTERY BOVIE PAD ADULT (Other) ×4 IMPLANT
CONV DNO USE 142120 - PACK THOROCOLUMBAR SPINE (Pack) ×2 IMPLANT
COVER LIGHT HANDLE STERIS (Other) ×6 IMPLANT
DEVICE BONE MILL DISPOSABLE STRYKER (Other) ×2 IMPLANT
DISC USE 103787 - DRESSING BENZOIN SNAP AMP (Dressing) ×2 IMPLANT
DISC USE 152549 - PREP CHLORAPREP 26ML WITH TINT ~~LOC~~ (Prep) ×4 IMPLANT
DRAIN WOUND SUCTION KIT (Drain) IMPLANT
DRAPE STERI 17 X 11 3M 1000 (Drape) ×2 IMPLANT
DRAPE STERI INSTUMENT POUCH 3M 1018 (Drape) ×2 IMPLANT
DRESSING GAUZE 4X4 BOX STERILE (9414S) (Dressing) IMPLANT
DRESSING STERISTRIPS 1/2IN (Dressing) IMPLANT
DRESSING TELFA 3 X 8IN (Dressing) ×2 IMPLANT
DRILL MIDAS REX ATTACHMENT 14CM X 3.0MM MATCH HEAD CUTTER MATCHSTICK STANDARD (Bit) ×2 IMPLANT
DRILL MIDAS REX DIFFUSER MR7 FOOT PEDAL (Other) ×2 IMPLANT
GLOVES BIOGEL 7 TOP GLOVE LATEX (Glove) ×4 IMPLANT
GLOVES BIOGEL INDICATOR 7 1/2 GREEN UNDERGLOVE LATEX (Glove) ×4 IMPLANT
ISOTIS DBM ACCELL EVO3 10CC 02-5000-100 (Demineralized Bone Matrix) ×2 IMPLANT
MATRIX FLOSEAL HEMOSTATIC 10ML WITHOUT NEEDLE (Sealant) ×2 IMPLANT
MEDYSSEY ROD ZENIUS TI 5.5 X 160MM R55160 (Rod) ×4 IMPLANT
MEDYSSEY SCREW POLYAXIAL 4.5 X 45MM SP04545 (Screw) ×4 IMPLANT
MEDYSSEY SCREW POLYAXIAL 5.5 X 40MM SP05540 (Screw) ×4 IMPLANT
MEDYSSEY SCREW POLYAXIAL 5.5 X 45MM SP05545 (Screw) ×8 IMPLANT
MEDYSSEY SET SCREW ZENIUS TI 9.5MM SA095 (Screw) ×16 IMPLANT
MEDYSSEY ZENIUS TI CROSS LINK 46MM CLZ146 (Connector) ×2 IMPLANT
NEEDLE SPINAL 18 GAUGE X 3 1/2IN (Needle) ×2 IMPLANT
PAD XODUS JACKSON KIT WITHOUT HEAD REST (Other) ×2 IMPLANT
PILLOW (322039) (Other) IMPLANT
PREP ALCOHOL PACK (Pack) ×2 IMPLANT
PUTTY DBM ACCELL EVO3 10CC 02-5000-100 (Demineralized Bone Matrix) ×2 IMPLANT
SCD SLEEVE CALF REG 18IN ALP 1 (Other) IMPLANT
SPONGE COTTONOID 1/2 X 1/2IN (Sponge) ×2 IMPLANT
SPONGE NEURO 140X15X75MM 20X10 (30-057) (Sponge) ×2 IMPLANT
STAPLER SKIN 35W (Stapler) ×2 IMPLANT
SUTURE POLYSORB 2-0 V-20 18IN POP OFF 5 PACK UNDYED (Suture) ×12 IMPLANT
SUTURE VICRYL 0 CT-1 18IN CONTROL RELEASE 8 PACK UNDYED (Suture) ×12 IMPLANT
SUTURE VICRYL 3-0 SH 18IN CONTROL RELEASE 8 PACK UNDYED (Suture) ×4 IMPLANT
TUBING CELL SAVER (Tubing) ×4 IMPLANT

## 2013-04-04 NOTE — Plan of Care (Signed)
Problem: Pressure Ulcer Risk (Using Braden Scale) (Adult, Obstetrics)  Goal: Skin Integrity (Pressure Ulcer Risk (Using Braden Scale))  Patient will demonstrate the desired outcomes.   Outcome: Progressing  Skin CDI, Incision with minimal drainage on dressing. Mepilex placed on sacral area to prevent skin breakdown.

## 2013-04-04 NOTE — Anesthesia Postprocedure Evaluation (Addendum)
ANESTHESIOLOGY POST OPERATIVE ASSESSMENT      04/11/2013  3:37 PM  This patient was seen, evaluated, and care plan was developed with the resident.  I agree with the assessment and plan as outlined in the resident's note.  Report electronically signed by L. Kaylenn Civil MD  [id number]  Attending  Pager [phone]   VITAL SIGNS    Vital Signs (Last Recorded):  BP: 131/59 mmHg  Pulse: 66  Resp: 10  Temp Max: 37.2 C (99 F)  (Last 24 hours)  Temp: 36.8 C (98.2 F)  SpO2: 96 % on    O2 Device (Oxygen Therapy): room air    RESPIRATORY FUNCTION     Lungs:  clear to auscultation bilaterally   Airway patency: Clear  Airway intervention: No intervention required     CARDIOVASCULAR FUNCTION    Cardiac:  regular    NEUROVASCULAR FUNCTION    Mental status: somnolent    PAIN ASSESSMENT: Patient has been asleep in PACU, when awoke immediately makes a grimace and states he is in pain, then goes back to sleep almost immediately        NAUSEA / VOMITING    Patient reports no nausea.  Patients current PO status: NPO    POSTOPERATIVE HYDRATION  Intake/Output Summary (Last 24 hours) at 04/04/13 2108  Last data filed at 04/04/13 2040   Gross per 24 hour   Intake   1300 ml   Output    695 ml   Net    605 ml     Current:   In: 300 [Crystalloid:300]  Out: 145 [Urine:35; Other:60]     ADDITIONAL PERTINENT INFORMATION    Complications: no      Electronically signed by:  Joaquin Bend, MD   Register-Anesthesiology and Pain Medicine  Pager #: [phone]  PI #: [id number]

## 2013-04-04 NOTE — OR Nursing (Signed)
OR Arrive    Patient's level of consciousness: awake and comfortable    Summary report reviewed: yes    Patient transported to OR via: gurney  Transported by: anesthesia person and CRNA  Head of bed: elevated  Oxygen delivered via: N/A  Monitored enroute: no  Assumed care.

## 2013-04-04 NOTE — ED Nursing Note (Signed)
Remains awake and alert, skin warm and dry.   Spine resident at bedside - consent signed

## 2013-04-04 NOTE — Plan of Care (Signed)
Problem: General Plan of Care  Goal: Plan of Care Review  The patient and/or their representative will communicate an understanding of their plan of care.    The patient-specific goals include:   Outcome: Progressing  Plan of care reviewed with pt

## 2013-04-04 NOTE — Progress Notes (Addendum)
DEPARTMENT OF NEUROLOGICAL SURGERY  NEUROCRITICAL CARE PROGRESS NOTE      Name: Carl Mata  MRN: [id number]  Date:   04/05/2013 POD 1 sp T10-L2 posterior fusion, T12 laminectomy     INTERVAL SUMMARY:   CT with adequate placement of hardware  Started on PCA overnight for pain control  Is requiring dopamine for MAP goals. Held off on central line overnight, written for picc this morning.     IV MEDICATIONS  DOPamine, 2-20 mcg/kg/min, IV, CONTINUOUS, Last Rate: 18 mcg/kg/min (04/05/13 0515)  Hydromorphone, , IV, PCA  NaCl 0.9% 1,000 mL with Potassium Chloride 20 mEq, , IV, CONTINUOUS, Last Rate: 125 mL/hr at 04/05/13 0500      SCHEDULED MEDICATIONS  Docusate (COLACE) Capsule 100 mg, ORAL, BID  Polyethylene Glycol 3350 (MIRALAX) Oral Powder Packet 17 g, ORAL, QAM  Sennosides (SENOKOT) Tablet 17.2 mg, ORAL, Daily Bedtime      PRN MEDICATIONS  Bisacodyl (DULCOLAX) Suppository 10 mg, RECTALLY, Q24H PRN  Calcium Carbonate (OS-CAL) Tablet 2,500-5,000 mg, ORAL, PRN  Calcium Gluconate 3,000 mg in 0.9% NaCl 100 mL IVPB 3,000 mg, IV, PRN  Diazepam (VALIUM) Injection 2.5-5 mg, IV, Q4H PRN  Hydrocodone 5 mg/Acetaminophen 325 mg (NORCO  5) Tablet 1-2 tablet, ORAL, Q4H PRN  Hydromorphone (DILAUDID) Injection 1-1.5 mg, IV, Q3H PRN  Lidocaine (L-M-X4) 4 % Cream, TOPICAL-site required, PRN  Lidocaine (XYLOCAINE) 10 mg/mL (1%) Injection 0.1-2 mL, INTRADERMAL, PRN  Lidocaine cream REMOVAL, TOPICAL-site required, PRN  Magnesium Hydroxide (MILK OF MAGNESIA) 400 mg/5 mL Suspension 30 mL, ORAL, Q12H PRN  Magnesium Oxide (MAG-OX 400) Tablet 800-1,600 mg, ORAL, PRN  Magnesium Sulfate 2 grams in 50 mL IVPB, IV, PRN  Naloxone (NARCAN) Injection 0.1 mg, IV, PRN  Ondansetron (ZOFRAN) Injection 4 mg, IV, Q8H PRN  Potassium Chloride 10 % Liquid 20-60 mEq, ORAL, PRN        Current Vital Signs  Temp: 36.8 C (98.2 F) (10/28 0515)  Temp src: Bladder Temp (10/28 0500)  Pulse: 59 (10/28 0515)  BP: 133/55 mmHg (10/28 0500)  Resp: 11 (10/28 0515)  SpO2:  94 % (10/28 0515)  Height: 172.7 cm (5\' 8" ) (10/27 2200)  Weight: 64.7 kg (142 lb 10.2 oz) (10/27 2200)    24 Hour Vital Signs  Temp src:  [-]   Temp:  [36.4 C (97.5 F)-37.2 C (99 F)]   Pulse:  [57-100]   BP: (110-152)/(42-77)   Resp:  [7-20]   SpO2:  [91 %-100 %]     I/O Last 2 Completed Shifts:  In: 1000 [Crystalloid:1000]  Out: 550 [Other:100]    PHYSICAL EXAM:  Neuro Exam:     Motor:    Deltoid Biceps Triceps Wrist Flexion Wrist Extension Hand Intrinsics   Right 5 5 5 5 5 5    Left 5 5 5 5 5 5       Hip Flexion Knee Extension Knee Flexion Dorsiflexion Great Toe Extension Plantar Flexion   Right 4- 4 4 4 4 4    Left 4 4+ 4+ 4+ 4+ 4+     Incision with overlying dressing. No drainage    HV 1 (left): 85  HV 2 (right): 110    ARTERIAL BLOOD GASES   Recent labs for the past 48 hours     04/04/13 1654    PO2, ART 336*    O2 SAT, ART 99.8    PCO2, ART 40.0    PH, ART 7.42    HCO3, ART 25.5    BASE EXCESS,  ART 1.2      Labs:  CBC   Recent labs for the past 72 hours     04/04/13 1513 04/04/13 1121    WHITE BLOOD CELL COUNT 6.1 6.2    HEMOGLOBIN 15.4 15.0    HEMATOCRIT 44.5 43.4    PLATELET COUNT 244 241      CHEM 20   Recent labs for the past 48 hours     04/04/13 1513 04/04/13 1121    SODIUM 137 136    POTASSIUM 4.4 4.5    CHLORIDE 103 103    CARBON DIOXIDE TOTAL 28 27    CREATININE BLOOD 0.72 0.70    UREA NITROGEN, BLOOD (BUN) 7* 7*    GLUCOSE 82 101*    CALCIUM 9.6 9.5    PROTEIN -- --    ALBUMIN -- --    ALKALINE PHOSPHATASE (ALP) -- --    ASPARTATE TRANSAMINASE (AST) -- --    BILIRUBIN TOTAL -- --    ALANINE TRANSFERASE (ALT) -- --    TRIGLYCERIDE -- --    URIC ACID, BLOOD -- --    LACTATE DEHYDROGENASE -- --    PHOSPHORUS (PO4) 2.0* --    CHOLESTEROL -- --    MAGNESIUM (MG) 1.9 --      SURGICAL DIC   Recent labs for the past 48 hours     04/04/13 1519    INR 0.96    APTT 32.5    FIBRINOGEN --    D-DIMER --       PLAN:   Neuro  Sp T12-L2 posterior fusion  -pain control  -serial neuro exams  -follow up post op CT  -follow HV output    MAP therapy  -goal>85 day 1  -PRN dopamine  -pending PICC for central access     CV: HDS. Dopamine for MAP goals  Pulm: satting well on RA  GI: regular diet  GU: foley for accurate I/O while on pressor  FEN: NS at 125cc/hr, replete electrolytes PRN  DVT: SCDs. Will hold chemoprophylaxis for at least 24 hours post op  ID: no active issues    Lianne Cure, MD  [facility] Neurosurgery PGY-3  Service Pager [phone]  Pager [phone]  PI [id number]    This patient was seen, evaluated, and care plan was developed with the resident.  I agree with the assessment and plan as outlined in the resident's note.  Report electronically signed by Fernande Bras, MD. Attending

## 2013-04-04 NOTE — Nurse Assessment (Signed)
ASSESSMENT NOTE    Note Started: 04/04/2013, 22:37     Initial assessment completed and recorded in EMR.  Report received from night shift nurse and orders reviewed. Plan of Care reviewed and appropriate, discussed with patient. Admitted for T10-L2 posterior fusion, T12 laminectomy. C/o back pain, PCA ordered. Denies numbness/tingling able to move all extremities. A/OX4. MAP therapy to keep >85, Dopamine gtt started. Hemovac X2 with sanguineous/serosanguineous drainage. Minimal drainage to incision site. Will continue to monitor. Delford Field, RN RN

## 2013-04-04 NOTE — Plan of Care (Signed)
Problem: General Plan of Care  Goal: Individualization/Patient-Specific Goal  The patient and/or their representative will achieve their patient-specific goals related to the plan of care.    The patient-specific goals include:  To regain normal sensation in bilateral lower extremities and lower back with decreased to no pain   Outcome: Progressing  To regain normal sensation in bilateral lower extremities and lower back with decreased to no pain.

## 2013-04-04 NOTE — Plan of Care (Signed)
Problem: Sleep Pattern Disturbance (Adult, Obstetrics)  Goal: Adequate Sleep/Rest (Sleep Pattern Disturbance)  Patient will demonstrate the desired outcomes.   Outcome: Progressing  Sleeping between care.

## 2013-04-04 NOTE — Plan of Care (Signed)
Problem: Perioperative Period (Adult, Obstetrics)  Intervention: Pressure Reduction Devices  Mepilex used on skin: head, knees, hips and chest

## 2013-04-04 NOTE — ED Nursing Note (Signed)
Assumed care of the patient.     Patient has left ankle bracelet that is cut off by physician with an agreement of his [employer] fast streamer Sherilyn Cooter.     GCS is 15. Alert and oriented x 4.Perrla. No neuro deficits are noted.    CSM is intact.    Patient is calm and cooperative.       States that his back pain is mid to lower back 9/10. Pain medicine is given.     Fall risk precautions are observed.

## 2013-04-04 NOTE — ED Nursing Note (Signed)
Assumed care of pt.   Awake and alert, skin warm and dry- reports lower back pain with  N/t to extremities and urinary incontinence.   Ambulated from triage.   Skin warm and dry

## 2013-04-04 NOTE — Nurse Assessment (Signed)
PACU ADMIT NURSING NOTE    Note Started: 04/04/2013, 19:46     Received patient from OR at 1939 hours via bed.  Monitor and Alarms on.  Patient sleepy but arousable. VSS. Pt moving all extremities. Salvadore Oxford, RN

## 2013-04-04 NOTE — Plan of Care (Signed)
Problem: Infection, Risk/Actual (Adult, Obstetrics)  Goal: Infection Prevention/Resolution/Control (Infection, Risk/Actual)  Patient will demonstrate the desired outcomes.   Outcome: Progressing  No s/s of infection at this time. Will monitor temp, wbc and incision site

## 2013-04-04 NOTE — Consults (Addendum)
DEPARTMENT OF NEUROLOGICAL SURGERY  GENERAL CONSULTATION    Consulting Service: ED    Date of Admission:   04/04/2013 10:18 AM Date of Service: 04/04/2013   Note Started: 04/04/2013, 15:50 Name of Requesting Attending: ED Attending          REASON FOR CONSULTATION:  Saddle Anesthesia, Urinary Incontinence, Low Back Pain    HISTORY OF PRESENT ILLNESS:  Carl Mata is a 37yr old male T 12 burst fracture s/p in June of 2014 who presents with progressively worsening lower back pain, weakness, saddle anesthesia, and intermittent urinary incontinence. Patient also has acute onset of hyperesthesias since yesterday morning. MRI shows worsening kyphotic deformity with stable but significant canal compromise.       HISTORY:  Patient Active Problem List    Diagnosis Date Noted    Stable burst fx of  T12 fracture 04/04/2013     Overview Note:     Patient sustained a stable T12 burst fracture and was seen and examined by the neurosurgery spine service who do not recommend surgical intervention for this type of fracture. It was recommended that he be fitted for a TLSO back brace which is to be worn while sitting upright and standing at all times for 4-6 weeks. His pain is controlled with PO narcotics and flexeril. He is to follow up with the neurosurgery spine clinic in 4 weeks or sooner if he is experiencing worsening of symptoms.       Chronic back pain 04/04/2013    Back pain 12/23/2012    Fall 12/07/2012     Overview Note:     C-spine cleared.       Elbow contusion 11/28/2012    Contusion 10/31/2012    Back sprain 10/31/2012    Cough 10/06/2012    Abdominal pain 09/24/2012    Right medial knee pain 09/20/2012    Allergies   Allergen Reactions    Nsaids (Non-Steroidal Anti-Inflammatory Drug) Swelling      Past Medical History   Diagnosis Date    Unspecified mental or behavioral problem      bipolar    Unspecified asthma(493.90)      No past surgical history on file.   (Not in a hospital admission)   Social  History     Occupational History    Not on file.     Social History Main Topics    Smoking status: Current Every Day Smoker -- 0.50 packs/day for 20 years     Types: Cigarettes    Smokeless tobacco: Not on file    Alcohol Use: No    Drug Use: Yes     Special: Methamphetamine    Sexually Active: Not on file    No family history on file.     There is no immunization history on file for this patient.    The patient's past medical, family, and social history was reviewed and confirmed.    ROS:  All other systems negative except as noted in the HPI.    VITAL SIGNS:  Vital Signs (Last Recorded):  Temp: 37.2 C (99 F) (04/04/13 1515)  Temp src: Oral  Pulse: 60  BP: 116/70 mmHg  Resp: 16  SpO2: 98 %     Weight: 65 kg (143 lb 4.8 oz)  There is no height on file to calculate BSA.  Body mass index is 21.79 kg/(m^2).    PHYSICAL EXAM:  General Appearance:  Neuro Exam:   GCS: Eyes: 4 Verbal: 5 Motor: 6  Pupils: OD size: 3 mm, reaction: brisk             OS size: 3 mm, reaction: brisk  Cranial Nerves: 2-12 intact    Motor:    Deltoid Biceps Triceps Wrist Flexion Wrist Extension Hand Intrinsics   Right 5 5 5 5 5 5    Left 5 5 5 5 5 5       Hip Flexion Knee Extension Knee Flexion Dorsiflexion Great Toe Extension Plantar Flexion   Right 3 pain 3pain 3pain 3pain 3pain 3pain   Left 3 pain 3pain 3pain 3pain 3pain 3pain     Reflexes:   Biceps Triceps Brachio-radialis Patellar Achilles Plantar Flexor Response   Right 2 2 2 1 3  silent   Left 2 2 2 1 3  silent     Sensory: In light to LT in all extremity       IMAGING: MRI T and L 04/04/13   1. NO ACUTE FRACTURE.    2. STABLE FINDINGS RELATED TO T12 BURST FRACTURE.    LAB TESTS / STUDIES:  I personally reviewed the following:       * Image(s): Carl Mata    Latest CMP  Recent labs for the past 48 hours     04/04/13 1513    SODIUM 137    POTASSIUM 4.4    CHLORIDE 103    CARBON DIOXIDE TOTAL 28    UREA NITROGEN, BLOOD (BUN) 7*    CREATININE BLOOD 0.72    E-GFR, AFRICAN AMERICAN  &gt;60    E-GFR, NON-AFRICAN AMERICAN &gt;60    GLUCOSE 82    CALCIUM 9.6    PROTHROMBIN TIME --    ALBUMIN --    ALKALINE PHOSPHATASE (ALP) --    ALANINE TRANSFERASE (ALT) --     Latest CBC  Recent labs for the past 48 hours     04/04/13 1513    WHITE BLOOD CELL COUNT 6.1    HEMOGLOBIN 15.4    HEMATOCRIT 44.5    PLATELET COUNT 244     Latest INR / APTT    No results found for this basename: INR,APTT in the last 48 hours    ASSESSMENT:  Carl Mata is a 37yr old male T12 burst fracture s/p in June of 2014 who presents with progressively worsening lower back pain, weakness, saddle anesthesia, and intermittent urinary incontinence. Patient shows signs and symptoms of cord compression with worsening MRI findings. Patient consented and taken to the OR for emergent decompression.     RECOMMENDATIONS:   - To OR with Neurosurgery for Decompression     [name], MD Intern    This patient was seen, evaluated, and care plan was developed with the resident.  I agree with the assessment and plan as outlined in the resident's note.  I personally reviewed the image and agree with the resident's assessment.  Report electronically signed by Fernande Bras, MD. Attending

## 2013-04-04 NOTE — Plan of Care (Signed)
Problem: Fall/Trauma/Injury Risk (Adult, Obstetrics)  Goal: Absence Of Trauma/Injury/Falls (Fall/Trauma/Injury Risk)  Patient will demonstrate the desired outcomes.   Outcome: Progressing  Free from fall/injury at this time.

## 2013-04-04 NOTE — Plan of Care (Signed)
Problem: Anxiety (Adult, Obstetrics, Pediatric)  Goal: Reduction/Resolution (Anxiety)  Patient will demonstrate the desired outcomes.   Outcome: Progressing  Pt appears calm and relaxed at this time.

## 2013-04-04 NOTE — Plan of Care (Signed)
Problem: Pain, Acute (Adult, Obstetrics)  Goal: Acceptable Pain Control/Comfort Level (Acute Pain)  Patient will demonstrate the desired outcomes.   Outcome: Progressing  Uncontrolled pain after surgery, PCA initiated and prn IV pushes available. Pt educated on PCA use. Will continue to monitor.

## 2013-04-04 NOTE — OR Nursing (Signed)
OR To Postop Destination    Patient's level of consciousness: drowsy but responsive    Patient transferred to: PACU 30  Transported via: unit bed  Transported by: anesthesia person and circulator  Head of bed: elevated  Oxygen delivered via: face mask  Monitored enroute: no    Report given to: PACU RN

## 2013-04-04 NOTE — Procedures (Addendum)
RESIDENT BRIEF OPERATIVE NOTE  Service Neurosurgery  04/04/2013, 19:24    Date of Service: 04/04/2013    Pre-Op Diagnosis:   1. T12 burst fracture with conus injury and possible cauda equina syndrome  2. Thoracolumbar kyphotic deformity    Post-Op Diagnosis:  same    Procedure Performed/Description:    1. T12 decompressive laminectomy  2. T10 to L2 posterior arthrodesis/instrumentation using pedicle screwes x 8, thoracolumbar rods x 2, cross-connector x 1 with in-situ autograft, cancellous allograft and DBM  3. Partial reduction of kyphotic deformity    Name of Surgeon and Assistants:  Surgeon(s) and Role:     * Danford Bad, MD - Resident - Assisting     * Neta Ehlers, DO - Resident - Assisting     * Horton Chin, MD - Resident - Assisting     * Fernande Bras, MD - Primary    Type of Anesthesia:  General    DVT Prophylaxis:  SCD    Findings:  Kyphotic deformity visualized via fluoroscopy; Instrumentation in appropriate position per fluoroscopy.     Specimens Removed:  * No specimens in log *    EBL:  100 ml    Drains:  Hemovac x 2    Fluids:  Crystalloid 1000 ml    Complications:  None    Outcome: Good    Neta Ehlers, DO Fellow      Operating Room Procedure Presence: Single Procedure.  I was physically present for the key portions (s) of the procedure and, during other times, was immediately available to return to the procedure.  The key portion(s) of the procedure are documented below.  Key Portions of the procedure:  Decompression and placement of implants    The information contained on this form is true and accurate to the best of my knowledge.  Further, I understand that if I misrepresent, falsify or conceal information regarding my participation in the professional service described above, I may be subject to fine, imprisonment, or civil penalty under applicable federal laws.     Fernande Bras, MD Attending Physician

## 2013-04-04 NOTE — ED Nursing Note (Signed)
Remains in MRI 

## 2013-04-04 NOTE — Anesthesia Preprocedure Evaluation (Addendum)
Anesthesia Evaluation    History of Present Illness  S/p fall several months ago now with cauda equina syndrome, h/o T12 burst fx, smoker, h/o meth use  Airway   Mallampati: II  TM distance: >3 FB     Neck ROM is full. Dental    (+) missing and chipped   Pulmonary - normal exam  (+) asthma,     Pulmonary ROS comment: smoker  Patient's breath sounds clear to auscultation. Cardiovascular     Rhythm: regular  Rate: normal  Patient has good exercise tolerance.   Neuro/Psych      Neuro/Psych ROS comment: Cauda equina syndrome, bipolar  Patient has psychiatric history.   GI/Hepatic/Renal - negative for GI hepatic or renal condions with ROS  GI exam normal    (-) GERD     Endo/Other - negative for endocrine conditions with ROS    (-) diabetes mellitus,           I have reviewed the patient's Nursing notes.             Anesthesia Plan    ASA 2 - emergent     general   (A-line)  intravenous induction   Anesthetic plan and risks discussed with patient.  Resident/Fellow/CRNA discussed the plan with the attending.  I personally performed a physical assessment on this patient.

## 2013-04-04 NOTE — Plan of Care (Signed)
Problem: Discharge Barriers  Goal: Patient¿s continuum of care needs are met  Outcome: Progressing  Continuum of care has been met at this time.

## 2013-04-04 NOTE — Progress Notes (Addendum)
DEPARTMENT OF NEUROLOGICAL SURGERY   SPINE POST-OP NOTE  04/04/2013, 21:55    POD 0 sp T10-L2 posterior fusion, T12 laminectomy     SUBJECTIVE:    C/o back pain, spasm. However does note improvement in sensation and strength subjectively    OBJECTIVE:     Medications  Scheduled Medications  Docusate (COLACE) Capsule 100 mg, ORAL, BID  [START ON 04/05/2013] Polyethylene Glycol 3350 (MIRALAX) Oral Powder Packet 17 g, ORAL, QAM  Sennosides (SENOKOT) Tablet 17.2 mg, ORAL, Daily Bedtime    Continuous Medications  DOPamine, 2-20 mcg/kg/min, IV, CONTINUOUS  NaCl 0.9% 1,000 mL with Potassium Chloride 20 mEq, , IV, CONTINUOUS, Last Rate: 125 mL/hr at 04/04/13 2026      PRN Medications  Bisacodyl (DULCOLAX) Suppository 10 mg, RECTALLY, Q24H PRN  Calcium Carbonate (OS-CAL) Tablet 2,500-5,000 mg, ORAL, PRN  Calcium Gluconate 3,000 mg in 0.9% NaCl 100 mL IVPB 3,000 mg, IV, PRN  Hydrocodone 5 mg/Acetaminophen 325 mg (NORCO  5) Tablet 1-2 tablet, ORAL, Q4H PRN  Hydromorphone (DILAUDID) Injection 1-1.5 mg, IV, Q3H PRN  Magnesium Hydroxide (MILK OF MAGNESIA) 400 mg/5 mL Suspension 30 mL, ORAL, Q12H PRN  Magnesium Oxide (MAG-OX 400) Tablet 800-1,600 mg, ORAL, PRN  Magnesium Sulfate 2 grams in 50 mL IVPB, IV, PRN  Ondansetron (ZOFRAN) Injection 4 mg, IV, Q8H PRN  Potassium Chloride 10 % Liquid 20-60 mEq, ORAL, PRN         Current Vital Signs  BP 146/61  Pulse 74  Temp(Src) 36.8 C (98.2 F) (Bladder Temp)  Resp 7  Wt 65 kg (143 lb 4.8 oz)  BMI 21.79 kg/m2  SpO2 97%    I/O Last 2 Completed Shifts:  In: 1000 [Crystalloid:1000]  Out: 550 [Other:100]    I/O Current Shift:  In: 300 [Crystalloid:300]  Out: 145 [Urine:35; Other:60]    PHYSICAL EXAM:  Neuro Exam:     Motor:    Deltoid Biceps Triceps Wrist Flexion Wrist Extension Hand Intrinsics   Right 5 5 5 5 5 5    Left 5 5 5 5 5 5       Hip Flexion Knee Extension Knee Flexion Dorsiflexion Great Toe Extension Plantar Flexion   Right 4- 4 4 4 4 4    Left 4  4+ 4+ 4+ 4+ 4+     Incision with overlying dressing. No drainage  HV x2                                   Labs:  Latest Chem--7 with Mg  Recent labs for the past 48 hours     04/04/13 1513    SODIUM 137    POTASSIUM 4.4    CHLORIDE 103    CARBON DIOXIDE TOTAL 28    UREA NITROGEN, BLOOD (BUN) 7*    CREATININE BLOOD 0.72    GLUCOSE 82    MAGNESIUM (MG) 1.9     Latest CBC  Recent labs for the past 48 hours     04/04/13 1513    WHITE BLOOD CELL COUNT 6.1    HEMOGLOBIN 15.4    HEMATOCRIT 44.5    PLATELET COUNT 244     Latest INR / APTT  Recent labs for the past 48 hours     04/04/13 1519    INR 0.96    APTT 32.5     ABG Last 24 hours   Recent labs for the past 24 hours  04/04/13 1654    PO2, ART 336*    O2 SAT, ART 99.8    PCO2, ART 40.0    PH, ART 7.42    HCO3, ART 25.5    BASE EXCESS, ART 1.2     PLAN:   Neuro  Sp T12-L2 posterior fusion  -pain control  -serial neuro exams  -follow up post op CT  -follow HV output    MAP therapy  -goal>85  -PRN dopamine  -pending PICC for central access     CV: HDS. Dopamine for MAP goals  Pulm: satting well on RA  GI: regular diet  GU: foley for accurate I/O while on pressor  FEN: NS at 125cc/hr, replete electrolytes PRN  DVT: SCDs. Will hold chemoprophylaxis for at least 24 hours post op  ID: no active issues    Lianne Cure, MD  [facility] Neurosurgery PGY-3  Service Pager [phone]  Pager [phone]  PI [id number]      I agree with the assessment and plan as outlined in the resident's note.  Report electronically signed by Fernande Bras, MD. Attending

## 2013-04-04 NOTE — ED Initial Note (Signed)
EMERGENCY DEPARTMENT PHYSICIAN NOTE - Carl Mata       Date of Service:   04/04/2013 10:18 AM Patient's PCP: No Pcp No Pcp   Note Started: 04/04/2013 11:03 DOB: 02/28/1976             Chief Complaint   Patient presents with    Back Pain Chronic           The history provided by the patient.  Interpreter used: No    Carl Mata is a 37yr old male, with a past medical history significant for T 12 burst fracture s/p fall while working several months ago, who presents to the ED with a chief complaint of acute onset worsening of his chronic back pain.  Patient states that he is no experiencing severe mid/lower back pain that radiated down both legs to the feet.  Patient also experiencing saddle anesthesia and bladder incontinence beginning this morning.  Patient able to ambulate self to ED this morning, but states that any movement causes him excruciating pain in both legs.  Pt also endorses a few day history for subjective fevers/chills and nausea.      Quality: Sharp  Location: back  Severity: 10/10  Time Course: worsening  Progression: unchanged  Duration: 24 hours  Palliative factors: laying down makes it better.  Provocative factors: bending, twisting, walking, exertion and standing makes it worse.  Associated symptoms: nausea, fevers/chills  Pertinent negatives: none    A full history, including pertinent past medical, family and social history was reviewed.    HISTORY:  There are no hospital problems to display for this patient.   Allergies   Allergen Reactions    Nsaids (Non-Steroidal Anti-Inflammatory Drug) Swelling      Past Medical History:    Unspecified mental or behavioral problem                      Unspecified asthma(493.90)                                 No past surgical history on file.   Social History    Marital Status: SINGLE              Spouse Name:                       Years of Education:                 Number of children:               Occupational History    None on file    Social  History Main Topics    Smoking Status: Current Every Day Smoker        Packs/Day: 0.50  Years: 20        Types: Cigarettes    Smokeless Status: Not on file                       Alcohol Use: No              Drug Use: Yes                Special: Methamphetamine    Sexual Activity: Not on file          Other Topics            Concern  None on file    Social History Narrative    None on file     No family history on file.             Review of Systems   Constitutional: Negative for fever, chills, diaphoresis, activity change, appetite change, fatigue and unexpected weight change.   HENT: Negative for neck pain and neck stiffness.    Cardiovascular: Negative for chest pain, palpitations and leg swelling.   Gastrointestinal: Positive for nausea. Negative for abdominal pain, diarrhea, constipation, blood in stool, abdominal distention and rectal pain.   Endocrine: Positive for polyuria. Negative for polydipsia and polyphagia.   Genitourinary: Positive for enuresis. Negative for dysuria, frequency, hematuria, discharge and difficulty urinating.   Musculoskeletal: Positive for back pain. Negative for myalgias, joint swelling and arthralgias.   Skin: Negative for color change, pallor, rash and wound.   Allergic/Immunologic: Negative for environmental allergies, food allergies and immunocompromised state.   Neurological: Positive for light-headedness. Negative for dizziness and headaches.   Psychiatric/Behavioral: Negative for behavioral problems, confusion, dysphoric mood, decreased concentration and agitation.       TRIAGE VITAL SIGNS:  Temp: 36.7 C (98.1 F) (04/04/13 1010)  Temp src: Oral (04/04/13 1010)  Pulse: 95 (04/04/13 1010)  BP: 110/61 mmHg (04/04/13 1010)  Resp: 16 (04/04/13 1010)  SpO2: 99 % (04/04/13 1010)  Weight: 65 kg (143 lb 4.8 oz) (04/04/13 1010)    Physical Exam   Nursing note and vitals reviewed.  Constitutional: He is oriented to person, place, and time. He appears well-developed and well-nourished.  He appears distressed.   HENT:   Head: Normocephalic and atraumatic.   Eyes: Conjunctivae are normal.   Cardiovascular: Normal rate, regular rhythm and normal heart sounds.  Exam reveals no gallop.    No murmur heard.  Pulmonary/Chest: Effort normal. No respiratory distress. He has no wheezes.   Abdominal: Soft. Bowel sounds are normal. There is no tenderness.   Musculoskeletal:   Extremely TTP lower thoracic spine upper lumbar spine.  No distinct step offs appreciated on exam- some soft tissue swelling around T12.  Decreased sensation below T12 on back.    LE strength exam limited by pain.   Neurological: He is oriented to person, place, and time. He displays normal reflexes. No cranial nerve deficit. He exhibits abnormal muscle tone.   Decreased sensation saddle region (perineal, genitials)   Skin: Skin is warm and dry. No rash noted. He is not diaphoretic. No erythema. No pallor.   Psychiatric: He has a normal mood and affect. His behavior is normal. Thought content normal.         INITIAL ASSESSMENT & PLAN, MEDICAL DECISION MAKING, ED COURSE:  Carl Mata is a 37yr old male who presents with a chief complaint of severe back pain radiating to both legs, saddle anesthesia, urinary incontinence. After history and exam, I feel the diagnosis is most likely acute instability of t12 burst fracture. Differential diagnosis includes, but is not limited to instability of burst fracture vs epidural abscess vs lumbar radiculopathy.  The patient is hemodynamically stable and will require pain control as an immediate intervention. Initial treatment and studies to evaluate this problem will include serial exams, labs, imaging, analgesia as needed and antiemetics as needed.     ED Course Update: The patient was observed in the ED. The results of the ED evaluation were notable for the following:    Pertinent lab results (reviewed and interpreted independently by me): UA, CBC, chem 7 unremarkable  Pertinent imaging results  (reviewed and interpreted independently by me): MRI- stable T12 burst fracture  Consults: A Consult was obtained from the Neurosurgery service to evaluate for Cauda Equina. They recommend admission to their service with likely emergent surgery including but not limited to laminectomy.      Further MDM  Additional data reviewed? N/A    ED Course:   1050- Initial Evaluation  1330- MRI    Patient Summary:   37 y/o M w/ history of prior stable T12 burst fracture now presenting with symptoms concerning for Cauda Equina.  Here in the emergency department the patient's physical exam is immediately concerning for cauda equina as the patient demonstrates saddle anesthesia, decreased rectal tone, history of urinary incontinence as well as severe back pain.  MRI was ordered and neurosurgery was consulted at the same time.  MRI demonstrates stable findings of the T12 burst fracture.  Though the MRI does not show worsening of the patient's fracture his physical exam is very concerning.  Neurosurgery is concerned that the patient may need emergency surgery including laminectomy and the patient will be admitted to the neurosurgical service for operative repair.  Patient is happy with this plan and all questions answered the bedside.  Patient has had no worsening of symptoms in the emergency department.      LAST VITAL SIGNS:  Temp: 36.7 C (98.1 F) (04/04/13 1010)  Temp src: Oral (04/04/13 1010)  Pulse: 95 (04/04/13 1010)  BP: 110/61 mmHg (04/04/13 1010)  Resp: 16 (04/04/13 1010)  SpO2: 99 % (04/04/13 1010)  Weight: 65 kg (143 lb 4.8 oz) (04/04/13 1010)      Clinical Impression:  1) Cauda Equina    Anticipated work up needed: Emergency neurosurgery    Disposition: Admit. Anticipate the patient will require greater than 2 nights of admission due to Cauda Equina.      MDM COMPLEXITY  Overall Complexity of MDM is: high      PRESENT ON ADMISSION:  Are any of the following four conditions present or suspected on admission: decubitus  ulcer, infection from an intravascular device, infection due to an indwelling catheter, surgical site infection or pneumonia? No.    PATIENT'S GENERAL CONDITION:  Serious: Vital signs may be unstable and not within normal limits. Patient is acutely ill. Indicators are questionable.    Report electronically signed by:  Davina Poke, MD Intern    This patient was seen, evaluated, and care plan was developed with the resident.  I agree with the findings and plan as outlined in our combined note.      Leanora Cover [place], DO

## 2013-04-04 NOTE — ED Triage Note (Addendum)
C/o increasing low back pain x 24 hrs since working on his car yesterday.  States he had an episode of urinary incontinence with his pain.  Hx of spinal fx on 2 / 12 this year.  States feels like his feet are numb but feels like broken glass when he is walking.    Ambulatory to ED.

## 2013-04-05 ENCOUNTER — Inpatient Hospital Stay: Payer: MEDICAID

## 2013-04-05 ENCOUNTER — Encounter: Payer: Self-pay | Admitting: Neurological Surgery

## 2013-04-05 ENCOUNTER — Inpatient Hospital Stay (HOSPITAL_COMMUNITY): Payer: MEDICAID

## 2013-04-05 MED ORDER — NACL 0.9% IV BOLUS - DURATION REQ
1000.0000 mL | Freq: Once | INTRAVENOUS | Status: AC
Start: 2013-04-05 — End: 2013-04-05
  Administered 2013-04-05: 1000 mL via INTRAVENOUS

## 2013-04-05 MED ORDER — LIDOCAINE HCL 10 MG/ML (1 %) INJECTION SOLUTION
20.0000 mL | INTRAMUSCULAR | Status: DC
Start: 2013-04-05 — End: 2013-04-09
  Filled 2013-04-05: qty 20

## 2013-04-05 NOTE — Consults (Addendum)
CRITICAL CARE CONSULT NOTE  Carl Mata  MRN [id number]   LOS: 1 day   Data Unavailable,[id number]  Date of Admission 04/04/2013  Date of Service 04/05/2013  CODE: Full     Requesting Service: Neurosurgery  Reason for Consulation: ICU level of care                       HPI: 37yr old male w/ hx of fall from a ladder at the end of June from ~75ft when he was fixing an [facility] unit, and had landed on his buttocks, resulting in a T12 compression fracture. During his admission he was managed conservatively with a TLSO back brace which was to be worn while sitting upright/standing for 4-6 week.   He returned to the hospital yesterday in the setting of lower back pain, saddle anesthesia, intermittent urinary incontinence, weakness and hyperesthesias. MRI re-demonstrated the burst fracture of T12 vertebral body with ~50% loss of anterior body height, which was worse than his first imaging series from when he had presented at the end of june.  He is now s/p T10-L2 posterior fusion, T12 laminectomy on 04/04/2013. In order to attain increased perfusion to the spinal cord dopamine is running for a goal MAP of 85 for the next week; now @ 18. Neurosurgery has planned for PICC placement for ongoing dopamine infusion.     The patient has been extubated since his procedure and is satting 94-98% on room air. His pain is currently being controlled with a PCA. He states that he is hungry and wants to eat, additionally finds the foley catheter to be uncomfortable. Denies sob, cough, chest pain, abdominal pain, constipation. States that paresthesias and sensation of weakness in the lower extremities has improved.       ROS: A complete review of systems was unremarkable except for what was mentioned in the HPI.     PMH:  Past Medical History   Diagnosis Date    Unspecified mental or behavioral problem      bipolar    Unspecified asthma(493.90)      Patient Active Problem List    Diagnosis Date Noted    Stable burst fx of  T12 fracture  04/04/2013     Overview Note:     Patient sustained a stable T12 burst fracture and was seen and examined by the neurosurgery spine service who do not recommend surgical intervention for this type of fracture. It was recommended that he be fitted for a TLSO back brace which is to be worn while sitting upright and standing at all times for 4-6 weeks. His pain is controlled with PO narcotics and flexeril. He is to follow up with the neurosurgery spine clinic in 4 weeks or sooner if he is experiencing worsening of symptoms.       Chronic back pain 04/04/2013    Back pain 12/23/2012    Fall 12/07/2012     Overview Note:     C-spine cleared.       Elbow contusion 11/28/2012    Contusion 10/31/2012    Back sprain 10/31/2012    Cough 10/06/2012    Abdominal pain 09/24/2012    Right medial knee pain 09/20/2012     PSH:  No past surgical history on file.    HOME MEDS:  Prescriptions prior to admission   Medication    ALBUTEROL INHA    Cyclobenzaprine (FLEXERIL) 10 mg Tablet    Docusate (COLACE) 100 mg Capsule  ALLERGIES:  Allergies   Allergen Reactions    Nsaids (Non-Steroidal Anti-Inflammatory Drug) Swelling       SHx:   Smokes 1/2 ppd x 20 yrs  Denies EtOH abuse, Hx of meth abuse     FHx:   Non-contributory    MEDICATIONS:  Scheduled Medications  Docusate (COLACE) Capsule 100 mg, ORAL, BID  Polyethylene Glycol 3350 (MIRALAX) Oral Powder Packet 17 g, ORAL, QAM  Sennosides (SENOKOT) Tablet 17.2 mg, ORAL, Daily Bedtime    IV Medications  DOPamine, 2-20 mcg/kg/min, IV, CONTINUOUS, Last Rate: 18 mcg/kg/min (04/05/13 0600)  Hydromorphone, , IV, PCA  NaCl 0.9% 1,000 mL with Potassium Chloride 20 mEq, , IV, CONTINUOUS, Last Rate: 125 mL/hr at 04/05/13 0600    PRN Medications  Bisacodyl (DULCOLAX) Suppository 10 mg, RECTALLY, Q24H PRN  Calcium Carbonate (OS-CAL) Tablet 2,500-5,000 mg, ORAL, PRN  Calcium Gluconate 3,000 mg in 0.9% NaCl 100 mL IVPB 3,000 mg, IV, PRN  Diazepam (VALIUM) Injection 2.5-5 mg, IV, Q4H  PRN  Hydrocodone 5 mg/Acetaminophen 325 mg (NORCO  5) Tablet 1-2 tablet, ORAL, Q4H PRN  Hydromorphone (DILAUDID) Injection 1-1.5 mg, IV, Q3H PRN  Lidocaine (L-M-X4) 4 % Cream, TOPICAL-site required, PRN  Lidocaine (XYLOCAINE) 10 mg/mL (1%) Injection 0.1-2 mL, INTRADERMAL, PRN  Lidocaine cream REMOVAL, TOPICAL-site required, PRN  Magnesium Hydroxide (MILK OF MAGNESIA) 400 mg/5 mL Suspension 30 mL, ORAL, Q12H PRN  Magnesium Oxide (MAG-OX 400) Tablet 800-1,600 mg, ORAL, PRN  Magnesium Sulfate 2 grams in 50 mL IVPB, IV, PRN  Naloxone (NARCAN) Injection 0.1 mg, IV, PRN  Ondansetron (ZOFRAN) Injection 4 mg, IV, Q8H PRN  Potassium Chloride 10 % Liquid 20-60 mEq, ORAL, PRN      VITAL SIGNS  Summary  Temp Min: 36.4 C (97.5 F) Max: 37.2 C (99 F)  BP: (110-152)/(42-77)   Pulse Min: 57 Max: 100  Resp Min: 7 Max: 20  SpO2 Min: 91 % Max: 100 %      Current Vitals  Temp: 36.9 C (98.4 F)  BP: 138/61 mmHg  Pulse: 70  Resp: 17  SpO2: 95 %      Weight: 64.7 kg (142 lb 10.2 oz)       Intake/Output Summary (Last 24 hours) at 04/05/13 0641  Last data filed at 04/05/13 0600   Gross per 24 hour   Intake 3920.66 ml   Output   2625 ml   Net 1295.66 ml       ABG   No results found for this basename: ARTPH:*,ARTPCO2:*,ARTPO2:*,ARTHCO3:*,ARTO2SAT:*,ARTBE:* in the last 12 hours    PHYSICAL EXAM  GENERAL: NAD. Sitting up in bed, drinking water. A/o x 3.  HEENT: NC/AT. PERRL. EOMI. Anicteric. MMM. OP Clear. Neck Supple.   LYMPH: no cervical or supraclavicular LAD  CHEST: CTAB, no w/r/r  HEART: RRR, no murmurs  ABDOMEN: BS+, Soft, NT/ND. No HSM.   EXT: WWP. No e/c/c  NEURO: CN2-12 grossly intact. Moves all four extremities.    PSYCH: normal affect  SKIN: no rashes, multiple tattoos.     CBC Recent labs for the past 48 hours     04/04/13 1513 04/04/13 1121    WHITE BLOOD CELL COUNT 6.1 6.2    HEMOGLOBIN 15.4 15.0    HEMATOCRIT 44.5 43.4    PLATELET COUNT 244 241      CHEM 20 Recent labs for the past 48 hours     04/04/13 1513  04/04/13 1121    SODIUM 137 136    POTASSIUM 4.4 4.5  CHLORIDE 103 103    CARBON DIOXIDE TOTAL 28 27    CREATININE BLOOD 0.72 0.70    UREA NITROGEN, BLOOD (BUN) 7* 7*    GLUCOSE 82 101*    CALCIUM 9.6 9.5    PROTEIN -- --    ALBUMIN -- --    ALKALINE PHOSPHATASE (ALP) -- --    ASPARTATE TRANSAMINASE (AST) -- --    BILIRUBIN TOTAL -- --    ALANINE TRANSFERASE (ALT) -- --    TRIGLYCERIDE -- --    URIC ACID, BLOOD -- --    LACTATE DEHYDROGENASE -- --    PHOSPHORUS (PO4) 2.0* --    CHOLESTEROL -- --    MAGNESIUM (MG) 1.9 --      PT/INR   Recent labs for the past 24 hours     04/04/13 1519    INR 0.96        IMAGING:  I have personally reviewed the following images with Dr. Loura Halt. Progression of spinal cord injury. No chest imaging performed.       IMPRESSION / RECS:  37 yo M w/ hx of fall resulting in T12 compression fracture that progressed leading to symptomatic cord compression necessitating emergent  T10-L2 posterior fusion, T12 laminectomy on 04/04/2013.      # T12 cord compression:  In order to attain increased perfusion to the spinal cord dopamine is running for a goal MAP of 85 for the next week; now @ 18. Neurosurgery has planned for PICC placement for ongoing dopamine infusion.   - agree with PICC line so that pressor is not given via peripheral line   - neurosurgical service plans to give dopamine for a 7 day course  - agree with PCA for pain as well as bowel regimen   - anticipate conversion to orals in near future.  - recommend checking Vitamin D and PTH levels      # history of tobacco abuse: currently no respiratory symptoms. SpO2 > 93% on RA. No wheezing on exam.   - robinul is not indicated at this time  - should patient develop wheezing would recommend combivent  - recommend ongoing utilization of incentive spirometer      VAP Prophylaxis: not intubated  DVT Prophylaxis:  None  Contraindicated because patient was just operated on; to be started when risk of bleed is lower  GI  Prophylaxis:  Not indicated  LDA: PIV x 3, R radial A line (10/27), L & R thoracic spine drain (10/27), foley (10/27)   Nutrition: regular diet    Pt seen & discussed with Dr. Loura Halt  We will continue to follow.  Feel free to call if any acute issues or questions.      Electronically signed by:   Geri Seminole  PCCM Fellow  Pager: [phone]  On-call Pager: [phone] (weekdays 7 PM - 7 AM & weekends)     This patient was seen, evaluated, and care plan was developed with the fellow. I personally reviewed the medical history, interviewed and examined the patient, reviewed and interpreted vital signs, laboratory studies, and radiographs. I agree with the assessment and plan as outlined in the fellow's note as edited.    Report electronically signed by Hardin Negus. Kerem Gilmer, DO. Attending

## 2013-04-05 NOTE — Procedures (Signed)
PATIENT:  Carl Mata, Carl Mata  MR #:  [id number]  DOB:  Apr 07, 1976  SEX:  M  AGE:  37  OPERATION DATE:  04/04/2013  INPATIENT OPERATION RECORD    PREOP DIAGNOSIS:    1. T12 burst fracture with conus injury, possible cauda equina  syndrome  2. thoracolumbar kyphotic deformity.    POSTOP DIAGNOSIS:    Same.    SURGICAL PROCEDURE:    1.  T12 decompressive laminectomy.  2.  T10-L2 posterior arthrodesis instrumentation using pedicle screws  x8, thoracolumbar rods x2, cross connector x1 with in situ autograft,  cancellous allograft, and demineralized bone matrix.  3.  Partial reduction of thoracolumbar kyphotic deformity.    INDICATIONS FOR SURGERY:    [name] is a 37 year old gentleman who has a known T12 burst  fracture that became obvious July 2014 altjought the trauma was in May  of 2014.  He was neurologically intact at that time and decision was  to manage him with a brace.  However he re-presented to the Emergency  Room with complaint of progressive worsening lower back pain with  weakness and saddle anesthesia with intermittent urine incontinence.  Although the repeat MRI did not show any significant change from  initial MRI we felt, based on the examination, that he has early onset  of cauda equina syndrome with possibility of both the conus and cauda  equina involvement.  He was taken emergently to the Operating Room for  decompression and instrument stabilization.    DESCRIPTION OF PROCEDURE:    Patient was brought to the Operating Room and after general anesthesia  was  administered, he was placed prone onto the [facility] spine table.  His back was then sterilely prepped and draped.  After surgical pause  and preop antibiotic administration was confirmed, local anesthetic  was  subcutaneously infiltrated along the midline.    Skin incision was then made.  Subperiosteal dissection was then  carried out to expose the transverse processes from L1-2 and working  cephalad rib head insertion up to T10.  Intraoperative  fluoroscopy was  obtained to confirm the correct level.  Starting from L2 we placed  pedicle screws bilaterally at L2, L1, skipping T12 burst fracture  level and then T11 and T12.  We then proceeded with the decompressive  laminectomy.  Using high-speed drill and rongeur T12 lamina was  resected and the ligamentum flavum was resected and decompression  carried out laterally to provide ample space for the thecal sac  at  T12-L1 region.  Rod was then contoured to try to restore a more  physiologic thoracolumbar curve.  Rod was cut to appropriate length  and secured with set screws.  We obtained intraoperative fluoroscopy  to confirm the position of the implant and adequate alignment  thoracolumbar region.  Then using a high-speed drill,  T10-11 facet  joints down to L1-2 facet joints were decorticated bilaterally in  addition to laterally transverse processes and  .  In situ  autograft from decompressive laminectomy was mixed with the cancellous  allograft and demineralized bone matrix and placed over the  decorticated surface.  After wound was thoroughly irrigated with  antibiotic saline.  After hemostasis was achieved 2 Hemovacs were  placed and wound was then closed in multiple layers.    COMPLICATIONS:    None.        NAME OF SURGEONS AND ASSISTANTS:    Surgeon:  Fernande Bras, MD  Assistant Surgeon:  Neta Ehlers, DO  Assistant  Surgeon:  Danford Bad, MD  Assistant Surgeon:  Lianne Cure, MD      Report Electronically Signed - 04/06/2013 08:19:03 by  Fernande Bras, MD  Professor and Chief Of Spinal Neurosurgery      KDK/ll  D:  04/05/2013 06:35:15 PDT/PST  T:  04/05/2013 10:19:37 PDT/PST  Job #:  [id number] / [id number]

## 2013-04-05 NOTE — Procedures (Signed)
MRN [id number]  Carl Mata  DOB 1975-07-06      PICC PROCEDURE NOTE  Note started: 04/05/2013  12:04        * Date of service: 04/05/2013     LOS:  LOS: 1 day      Pre Procedure Diagnosis: Inpatient: Back Pain Chronic  Post Procedure Diagnosis: same  * Patient's specific hospital location during procedure: [facility]    * Occupation of inserter: RN  Ordering MD: Lianne Cure  Service: Neurosurgery  PI #: [id number]  Pager: [phone]    Was inserter a member of PICC Team yes  Reason for insertion  Indication: New indication   PICC Use: IV Access    Appropriate procedural pause was taken.  See pre-procedure checklist for additional documentation.    Inserter performed hand hygiene prior to central line insertion: yes    Maximal sterile barriers used:  Skin Prep:  * Sterility: Cap, , Drape,, Gloves,, Gown, and Mask,  * Skin Prep: Chlorhexidine Anti-Septic Pre-Wash, Alcohol Swab and Chlorhexidine Sterile Prep  Was skin prep completely dry at time of first skin puncture yes    Modified Seldinger insertion technique was utilized. Ultrasound guidance was utilized to document selected vessel patency and concurrent ultrasound visualization of vascular needle entry into venous lumen. This vein chosen because 0.49 cm.   * Insertion site: right brachial vein, 9 CM above the ACF    Measurements:  Internal length (IL): 42 CM  External length (EL): 4 CM  Mid-arm circumference (MAC): 26 CM measured @ 10 CM above ACF.   Mid-arm circumference (MAC): 24 CM measured @ 10 CM below ACF.    PICC Catheter:  Type of PICC: Solo Power PICC     * Lumen: Double Lumen    * Size: 5 FR  Lot #: [id number]  Vein accessed using: 5 FR Micro Introducer with 20 Gauge IV catheter  Blood return: yes  Saline lock: yes  Heparin lock: no    Post Procedure:  Estimated blood loss: minimal  Complications: None  Comments:   Post Procedure CXR: Ordered    Did this insertion attempt result in a successful central line placement? yes    * Data needed by Infection Control for CLIP  reporting   CLIP notification required for each line note (CL,PA Cath):Send CLIP notification (CL,PA,PICC lines)      Iona Hansen, RN RN

## 2013-04-05 NOTE — Allied Health Procedure (Signed)
Occupational Therapy Progress Note   OT orders, chart reviewed pt is currently receiving A-line, will conduct assessment when pt is available.    Octavia Heir, O.T.D., O.T.R./L  Occupational Therapist III / Neuro Clinical Specialist [phone]  Dept. Of Physical Medicine & Rehabilitation / Acute Care Services  Pager Number / OT Scheduling [phone]  Vocera [phone]

## 2013-04-05 NOTE — Progress Notes (Deleted)
DEPARTMENT OF NEUROLOGICAL SURGERY  NEUROCRITICAL CARE PROGRESS NOTE      Name: Carl Mata  MRN: [id number]  Date:   04/06/2013 POD 2 sp T10-L2 posterior fusion, T12 laminectomy     INTERVAL SUMMARY:   -Right PICC placed, Aline replaced.    IV MEDICATIONS  DOPamine, 2-20 mcg/kg/min, IV, CONTINUOUS, Last Rate: 19 mcg/kg/min (04/06/13 0500)  Hydromorphone, , IV, PCA  NaCl 0.9% 1,000 mL with Potassium Chloride 20 mEq, , IV, CONTINUOUS, Last Rate: 20 mL/hr at 04/06/13 0500      SCHEDULED MEDICATIONS  Docusate (COLACE) Capsule 100 mg, ORAL, BID  Lidocaine (XYLOCAINE) 10 mg/mL (1%) Injection 20 mL, INFILTRATION, AT BEDSIDE  Polyethylene Glycol 3350 (MIRALAX) Oral Powder Packet 17 g, ORAL, QAM  Sennosides (SENOKOT) Tablet 17.2 mg, ORAL, Daily Bedtime      PRN MEDICATIONS  Bisacodyl (DULCOLAX) Suppository 10 mg, RECTALLY, Q24H PRN  Calcium Carbonate (OS-CAL) Tablet 2,500-5,000 mg, ORAL, PRN  Calcium Gluconate 3,000 mg in 0.9% NaCl 100 mL IVPB 3,000 mg, IV, PRN  Diazepam (VALIUM) Injection 2.5-5 mg, IV, Q4H PRN  Hydrocodone 5 mg/Acetaminophen 325 mg (NORCO  5) Tablet 1-2 tablet, ORAL, Q4H PRN  Hydromorphone (DILAUDID) Injection 1-1.5 mg, IV, Q3H PRN  Magnesium Hydroxide (MILK OF MAGNESIA) 400 mg/5 mL Suspension 30 mL, ORAL, Q12H PRN  Magnesium Oxide (MAG-OX 400) Tablet 800-1,600 mg, ORAL, PRN  Magnesium Sulfate 2 grams in 50 mL IVPB, IV, PRN  Naloxone (NARCAN) Injection 0.1 mg, IV, PRN  Ondansetron (ZOFRAN) Injection 4 mg, IV, Q8H PRN  Potassium Chloride 10 % Liquid 20-60 mEq, ORAL, PRN        Current Vital Signs  Temp: 36.3 C (97.3 F) (10/29 0500)  Temp src: Tympanic (10/29 0500)  Pulse: 94 (10/29 0500)  BP: 134/65 mmHg (10/29 0500)  Resp: 7 (10/29 0500)  SpO2: 98 % (10/29 0500)  Height: 172.7 cm (5\' 8" ) (10/27 2200)  Weight: 64.7 kg (142 lb 10.2 oz) (10/27 2200)    24 Hour Vital Signs  Temp src:  [-]   Temp:  [27 C (80.6 F)-37.1 C (98.8 F)]   Pulse:  [44-94]   BP: (120-150)/(48-72)   Resp:  [6-24]   SpO2:  [91 %-100  %]     I/O Last 2 Completed Shifts:  In: 5811.3 [Oral:150; Crystalloid:5661.3]  Out: 3545 [Urine:3110; Other:385]    PHYSICAL EXAM:  Neuro Exam:   E4M6V5     MAP >85 requiring dopamine 19     Motor:   BUE 5/5    Hip Flexion Knee Extension Knee Flexion Dorsiflexion Great Toe Extension Plantar Flexion   Right 4 4 4 4  4+ 4+   Left 4+ 4+ 4+ 4+ 4+ 4+     Dressing clean dry intact     HV 1 (left): 135  HV 2 (right): 145    ARTERIAL BLOOD GASES   Recent labs for the past 48 hours     04/04/13 1654    PO2, ART 336*    O2 SAT, ART 99.8    PCO2, ART 40.0    PH, ART 7.42    HCO3, ART 25.5    BASE EXCESS, ART 1.2      Labs:  CBC   Recent labs for the past 72 hours     04/04/13 1513 04/04/13 1121    WHITE BLOOD CELL COUNT 6.1 6.2    HEMOGLOBIN 15.4 15.0    HEMATOCRIT 44.5 43.4    PLATELET COUNT 244 241  CHEM 20   Recent labs for the past 48 hours     04/04/13 1513 04/04/13 1121    SODIUM 137 136    POTASSIUM 4.4 4.5    CHLORIDE 103 103    CARBON DIOXIDE TOTAL 28 27    CREATININE BLOOD 0.72 0.70    UREA NITROGEN, BLOOD (BUN) 7* 7*    GLUCOSE 82 101*    CALCIUM 9.6 9.5    PROTEIN -- --    ALBUMIN -- --    ALKALINE PHOSPHATASE (ALP) -- --    ASPARTATE TRANSAMINASE (AST) -- --    BILIRUBIN TOTAL -- --    ALANINE TRANSFERASE (ALT) -- --    TRIGLYCERIDE -- --    URIC ACID, BLOOD -- --    LACTATE DEHYDROGENASE -- --    PHOSPHORUS (PO4) 2.0* --    CHOLESTEROL -- --    MAGNESIUM (MG) 1.9 --      SURGICAL DIC   Recent labs for the past 48 hours     04/04/13 1519    INR 0.96    APTT 32.5    FIBRINOGEN --    D-DIMER --      PLAN:   Neuro  1) T12 burst fracture, kyphotic deformity Sp T12-L2 posterior fusion  -Pain control  -Serial neuro exams  -TLSO brace fitted and at bedside.  -follow HV output  -Postop CT with good correction and adequate instrumentation and decompression at T12.  -Wean PCA today    -MAP therapy  -goal>85 day 2/?   -PRN dopamine  -PICC placed 10/28 for central  access     CV: HDS. Dopamine for MAP goals  Pulm: satting well on RA  GI: Regular diet. Routine bowel care.  GU: Foley for accurate I/O while on pressor  FEN: NS at 125cc/hr, replete electrolytes PRN  DVT: SCDs. SQH   ID: Afebrile, no leukoyctosis  Misc: PT.OT, PMR consult.   Lines: Aline 10/29, PICC 10/28    Lissa Hoard, M.D. PGY2  [place]  NSG Svc Pager: [phone]  Direct Pager [phone]  PI# [phone]

## 2013-04-05 NOTE — Nurse Transfer Note (Signed)
PACU TRANSFER NOTE    Note Started: 04/05/2013, 05:07     Patient transferred to NSICU via bed with room air and propac at 2130 hours after stat CT obtained. Bed down, locked, side rails up times 4, and call light in reach. Belongings not applicable. Report in EMR and RN at [facility] on arrival. Assessment unchanged. Salvadore Oxford, RN

## 2013-04-05 NOTE — Allied Health Progress (Signed)
Orthotics/Prosthetics Outpatient Progress Note    Progress Note: Patient Keon, Pender was seen in unit P3NI in bed 4 for a TLSO brace.  Has received a TLSO back in July. Patient provided with same sized TLSO.  Patient provided with a 12" back panel, large front panel, and large LSO, and small sternal extension.  Patient shown how to properly don and doff the TLSO, as well as instructions on cleaning and maintenance.  Patient in the middle of a procedure.  Brace left at bedside for therapist to apply. Follow up as needed      Dx:   sp thoracolumbar fusion    Education:     Handout: yes  Verbal Instruction: yes    Referring Physician: Kingsley Callander    Kae Heller, [facility]  Cert #: [id number]    Dept. Of PM&R: Orthotics/ Prosthetics  [phone] (phone)  [phone] (beeper)

## 2013-04-05 NOTE — Allied Health Progress (Signed)
04/05/2013  PT order received.  Patient awaiting TLSO for OOB in AM.  TLSO just arrived, but patient is currently having A-line placed in L UE.  PT will attempt to evaluation patient tomorrow.   Derinda Sis, DPT  Physical Therapist I  [facility]  Department of PM&R  Acute Care Section  Office: [phone]  Vocera: [phone]

## 2013-04-05 NOTE — Nurse Assessment (Signed)
ASSESSMENT NOTE    Note Started: 04/05/2013, 21:47     Initial assessment completed and recorded in EMR.  Report received from day shift nurse and orders reviewed. Plan of Care reviewed and appropriate, discussed with patient.  Admitted for T10-L2 posterior spinal fusion with T12 laminectomy. Neuro intact with mild weakness to bilat lower extremities. Able to stand at bs with TLSO brace, good posture. Pain controlled with PCA and occassional prn norco and dilaudid. MAP goal >85, dopamine gtt infusing. Arterial line positional. On RA with good sats. HV drain X2 with good output. Dsg to mid back reinforced with foam tape. Incision CDI. PICC placed today on RUE. Delford Field, RN RN

## 2013-04-05 NOTE — Nurse Assessment (Signed)
ASSESSMENT NOTE    Note Started: 04/05/2013, 10:56     Initial assessment completed and recorded in EMR.  Report received from night shift nurse and orders reviewed. Plan of Care reviewed and appropriate, discussed with patient.  Cristino Martes, RN RN

## 2013-04-05 NOTE — Allied Health Consult (Signed)
PM & R -- ACUTE CARE SERVICE      PHYSICAL THERAPY EVALUATION     Name: Carl Mata   MRN: [id number]   Date of Service: 04/06/2013  Time In: 11:00AM    Total Time: 35 Minutes                                                                                                                                                  INTAKE INFORMATION AND HISTORY:                       Therapy Consult(s) Ordered: Physical Therapy and Occupational Therapy  Primary Service: (A) Neurosurgery   Date of Admission: 04/04/2013  Date of Onset (Medicare only):  04/04/2013  Diagnosis: Chronic T12 burst fracture, with acute presentation of cauda equina syndrome  Language: English    Precautions: Fall precautions, TLSO for OOB, and Maintain MAP >85    History of Present Illness/Injury (Including pertinent test results & procedures):  Per MD note from 04/04/2013: "Carl Mata is a 37yr old male T 12 burst fracture s/p in June of 2014 who presents with progressively worsening lower back pain, weakness, saddle anesthesia, and intermittent urinary incontinence. Patient also has acute onset of hyperesthesias since yesterday morning. MRI shows worsening kyphotic deformity with stable but significant canal compromise. "    Procedure Performed/Description on 04/04/2013:   1. T12 decompressive laminectomy  2. T10 to L2 posterior arthrodesis/instrumentation using pedicle screwes x 8, thoracolumbar rods x 2, cross-connector x 1 with in-situ autograft, cancellous allograft and DBM  3. Partial reduction of kyphotic deformity    Past Medical/Surgical History:    Past Medical History   Diagnosis Date    Unspecified mental or behavioral problem      bipolar    Unspecified asthma(493.90)      No past surgical history on file.        Social History:  Reports that he is on parol in [place], but that his family and friends live "in other cities".    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  History     Social History    Marital Status: SINGLE     Spouse Name: N/A     Number of Children: N/A    Years of Education: N/A     Occupational History    Not on file.     Social History Main Topics    Smoking status: Current Every Day Smoker -- 0.50 packs/day for 20 years     Types: Cigarettes    Smokeless tobacco: Not on file    Alcohol Use: No    Drug Use: Yes     Special: Methamphetamine    Sexual Activity: Not on file  Other Topics Concern    Not on file     Social History Narrative    No narrative on file           SUBJECTIVE EXAM:    Current Living Situation: Alone  Support at Time of Discharge:  TBD  Environment at Discharge:  Was living at an ALF, but patient reports he is currently homeless.  Environmental Barriers:  Unknown  Assistive Devices Owned: None  Adaptive Equipment Owned: N/A  Prior Level of Function: Ambulating independently without assistive device  Independent with all ADLS  Age appropriate  Mental Status: Alert and oriented x4    Pain Level / Chief Complaint: 8/10 pain in back.        OBJECTIVE EXAMINATION:    Observation: Patient received in bed with clean wound dressing mid-spine with 2 hemovac drains in place.   Parolee GPS anklet on left ankle.  Multiple lines and monitors in place.     Extremity Status: WFL       ROM MMT Sensation Tone Coordination   RUE        LUE        RLE        LLE        Comments:      Demonstrated Functional Activities: Assisted patient with donning TLSO while seated at EOB.   Key: Dep=Dependent     Max=Maximal     Mod=Moderate     Min=Minimal     CG=Contact Guard     SB=Stand-By     S=Supervised     I=Independent     NA=Not Applicable     NT=Not Tested     N=Normal     G=Good     F=Fair     P=Poor     U=Unable     FWW=Front-Wheeled Walker     AC=Axillary Crutches     SPC=Single-Point Cane     Level of Assistance Dep Max Mod Min CG SB S I NA NT Comments   Rolling        X     With rail   Scooting        X        Sidelying/ Supine to Sit       X     HOB at 45 degrees, no rail.   Transfer Sit to Stand     X      With FWW   Transfer Bed to Chair     X      With FWW   Gait       X      33' with FWW.  Right ankle remains partially inverted.  Very narrow BOS--almost scissoring.  Heavy reliance of UEs on FWW.   Up/Down Step/Stairs          X    Comments: MAP remained above 85 during mobility assessment.      Balance:  See grid.     N G F P U NA NT Comments   Sitting Balance - Static  X         Sitting Balance - Dynamic   X        Standing Balance - Static   X     Able to let go of FWW and maintain static standing balance.   Standing Balance - Dynamic    X    Relies heavily on FWW.   Comments: Denies hx of falls.  ASSESSMENT / RECOMMENDATIONS/ EDUCATION:    Physical Therapy Assessment and Discharge Recommendations:  The patient is a 37 year old male with significant history of a T 12 burst fracture who admitted to [facility] with sx of cauda equina, for which he underwent surgical fixation of his T10-L2 spinal levels.  He presents with pain-induced weakness, impaired balance, and abnormal gait.  He will benefit from skilled PT services to address these issues so that he may return to his PLOF.  Recommend consultation with social worker due to lack of dispo at this time.    Physical Therapy Recommendations for Nursing:  Assist OOB to chair 2-3 x per day via stand pivot with FWW (CGA).  Increase tolerance as able.  Encourage seated exercises (ankle pumps, seated marching, long arc quads,sit-to-stands).   Ambulate in hallways with FWW (CGA).     Current Phase:  4     Patient / Caregiver Education Today: Back precautions. PT POC.     Method of Teaching: verbal     Learner: patient    Response: partially understands, needs more practice    GOALS / TREATMENT PLAN / FUNCTIONAL PROGNOSIS:  Patient's Goals: To be able to pee again and to not have back pain.    Physical Therapy Goals:    Bed Mobility: Independent   Transfers: Mod I with least restrictive AD, if needed.    Gait: >200' Mod I with least restrictive AD, if needed.   Stair Gait: Up/Down curb step SBA with least restrictive AD, if needed.   Patient Education: Patient able to follow post-surgical spine precautions without verbal cues when mobilizing.  Other: Tinetti Test Score >19/28 to indicate reduced fall risk.  Patient able to don/doff TLSO independently.     Prognosis:  Good for stated goals.     Treatment Plan:    Bed Adult nurse / Journalist, newspaper Recommendations     Recommended Frequency of Treatment: 1-2 times per day     Recommended Duration of Treatment: 14 days, then re-assess.    Patient / Caregiver Participation / Education   Has the plan of care been explained to the patient / caregiver? yes   Is the patient able to understand the plan of care?   yes    Does the patient / caregiver(s) agree with the plan of care? yes    List Barriers that may interfere with plan of care:   Pain    Interim Report Due:  04/20/2013     Patient seen for additional physical therapy treatment; see 04/05/2013 physical therapy progress note for details.    X   No additional treatment rendered this encounter.     Reported by:  Derinda Sis, DPT  Physical Therapist I  [facility]  Department of PM&R  Acute Care Section  Office: [phone]  Vocera: [phone]

## 2013-04-06 ENCOUNTER — Inpatient Hospital Stay: Payer: MEDICAID

## 2013-04-06 LAB — BASIC METABOLIC PANEL
Calcium: 8.7 mg/dL (ref 8.6–10.5)
Carbon Dioxide Total: 24 meq/L (ref 24–32)
Chloride: 105 meq/L (ref 95–110)
Creatinine Blood: 0.56 mg/dL (ref 0.44–1.27)
E-GFR, African American: >60 SEE NOTE (ref >60)
E-GFR, Non-African American: >60 SEE NOTE (ref >60)
Glucose: 134 mg/dL — ABNORMAL HIGH (ref 70–99)
Potassium: 3.8 meq/L (ref 3.3–5.0)
Sodium: 136 meq/L (ref 135–145)
Urea Nitrogen, Blood (BUN): 8 mg/dL (ref 8–22)

## 2013-04-06 LAB — CBC NO DIFFERENTIAL
Hematocrit: 38.5 % — ABNORMAL LOW (ref 40–52)
Hemoglobin: 13.4 g/dL — ABNORMAL LOW (ref 14.0–18.0)
MCH: 33.3 pg — ABNORMAL HIGH (ref 27–33)
MCHC: 34.7 % (ref 32–36)
MCV: 95.8 UM3 (ref 80–100)
MPV: 8 UM3 (ref 6.8–10.0)
Platelet Count: 237 K/MM3 (ref 130–400)
RDW: 13.1 U (ref 0–14.7)
Red Blood Cell Count: 4.02 M/MM3 — ABNORMAL LOW (ref 4.5–5.9)
White Blood Cell Count: 11.9 K/MM3 — ABNORMAL HIGH (ref 4.5–11.0)

## 2013-04-06 LAB — INR: INR: 1.02 (ref 0.87–1.18)

## 2013-04-06 MED ORDER — LIDOCAINE HCL 10 MG/ML (1 %) INJECTION SOLUTION
0.5000 mL | Freq: Once | INTRAMUSCULAR | Status: AC
Start: 2013-04-06 — End: 2013-04-06
  Administered 2013-04-06: 5 mL
  Filled 2013-04-06: qty 20

## 2013-04-06 MED ORDER — HEPARIN, PORCINE (PF) 5,000 UNIT/0.5 ML INJECTION SYRINGE
5000.0000 [IU] | INJECTION | Freq: Three times a day (TID) | INTRAMUSCULAR | Status: DC
Start: 2013-04-06 — End: 2013-04-09
  Administered 2013-04-06 – 2013-04-08 (×8): 5000 [IU] via SUBCUTANEOUS
  Filled 2013-04-06 (×9): qty 1

## 2013-04-06 MED ORDER — HYDROMORPHONE 1 MG/ML INJECTION SYRINGE
1.0000 mg | INJECTION | INTRAMUSCULAR | Status: DC | PRN
Start: 2013-04-06 — End: 2013-04-07
  Administered 2013-04-06 (×5): 1 mg via INTRAVENOUS
  Administered 2013-04-06: 2 mg via INTRAVENOUS
  Administered 2013-04-07: 1 mg via INTRAVENOUS
  Filled 2013-04-06 (×7): qty 1

## 2013-04-06 MED ORDER — LIDOCAINE 2 % MUCOSAL JELLY IN APPLICATOR
1.0000 | Freq: Once | Status: AC
Start: 2013-04-06 — End: 2013-04-07
  Administered 2013-04-07: 1 via URETHRAL
  Filled 2013-04-06: qty 1

## 2013-04-06 MED ORDER — LIDOCAINE HCL 10 MG/ML (1 %) INJECTION SOLUTION
20.0000 mL | Freq: Once | INTRAMUSCULAR | Status: AC
Start: 2013-04-06 — End: 2013-04-06
  Administered 2013-04-06: 20 mL
  Filled 2013-04-06: qty 20

## 2013-04-06 NOTE — Nurse Assessment (Signed)
ASSESSMENT NOTE    Note Started: 04/06/2013, 08:15     Initial assessment completed and recorded in EMR.  Report received from night shift nurse and orders reviewed. Plan of Care reviewed and appropriate, discussed with patient.  Cristino Martes, RN RN

## 2013-04-06 NOTE — Procedures (Signed)
ADULT LINE PROCEDURE NOTE WITH CLIP NOTIFICATION    MRN: [id number] Carl Mata  DOB: 1976-02-12 Date of Service: 04/06/2013    19:18   Date of Admission: 04/04/2013 10:18 AM  Patient's PCP: No Pcp No Pcp     Patient's specific hospital location during procedure: [facility]   Consent form completed and signed by the patient/guardian: No  Was an interpreter used? no  ID verified by two sources (select any two from list): MRN and Name  Was this an emergency procedure?  yes    Reason for Insertion/Pre-Procedure Diagnosis: Replace - malfunctioning line  Post Procedure Diagnosis: same  Surgeon/Line Inserter:  Jarrett Soho, MD      TYPE OF PROCEDURE: ARTERIAL LINE PLACEMENT    Occupation of inserter: Resident  Procedural Pause Performed: yes  Skin Prep: Chlorhexidine  Was skin prep completely dry at time of first skin puncture yes  Sterility:  were maximal barrier precautions used for sterility (including handwashing, cap, full sterile drape, gloves, gown and mask):  no:  please identify barrier and sterility precautions used: Handwashing,, Cap, , Drape,, Gloves, and Mask,  Type of Anesthesia/Local Anesthetic: N/A    Artery: Radial left  Estimated blood loss: minimal  Confirmation of Placement: Transduced Arterial Waveform  Complications: Multiple attempts (number): 3  Did this insertion attempt result in a successful line placement? yes      CLIP notification not required - arterial line only          Jarrett Soho, MD   Resident  Pager#: [phone]

## 2013-04-06 NOTE — Progress Notes (Addendum)
DEPARTMENT OF NEUROLOGICAL SURGERY  NEUROCRITICAL CARE PROGRESS NOTE      Name: Carl Mata  MRN: [id number]  Date:   04/06/2013 POD 2 sp T10-L2 posterior fusion, T12 laminectomy     INTERVAL SUMMARY:   -Right PICC placed, Aline replaced.    IV MEDICATIONS  DOPamine, 2-20 mcg/kg/min, IV, CONTINUOUS, Last Rate: 19 mcg/kg/min (04/06/13 0803)  Hydromorphone, , IV, PCA  NaCl 0.9% 1,000 mL with Potassium Chloride 20 mEq, , IV, CONTINUOUS, Last Rate: 20 mL/hr at 04/06/13 0800      SCHEDULED MEDICATIONS  Docusate (COLACE) Capsule 100 mg, ORAL, BID  Heparin PF 5,000 units/0.5 mL Injection 5,000 Units, SUBCUTANEOUS, Q8H  Lidocaine (XYLOCAINE) 10 mg/mL (1%) Injection 20 mL, INFILTRATION, AT BEDSIDE  Polyethylene Glycol 3350 (MIRALAX) Oral Powder Packet 17 g, ORAL, QAM  Sennosides (SENOKOT) Tablet 17.2 mg, ORAL, Daily Bedtime      PRN MEDICATIONS  Bisacodyl (DULCOLAX) Suppository 10 mg, RECTALLY, Q24H PRN  Calcium Carbonate (OS-CAL) Tablet 2,500-5,000 mg, ORAL, PRN  Calcium Gluconate 3,000 mg in 0.9% NaCl 100 mL IVPB 3,000 mg, IV, PRN  Diazepam (VALIUM) Injection 2.5-5 mg, IV, Q4H PRN  Hydrocodone 5 mg/Acetaminophen 325 mg (NORCO  5) Tablet 1-2 tablet, ORAL, Q4H PRN  Hydromorphone (DILAUDID) Injection 1-1.5 mg, IV, Q3H PRN  Magnesium Hydroxide (MILK OF MAGNESIA) 400 mg/5 mL Suspension 30 mL, ORAL, Q12H PRN  Magnesium Oxide (MAG-OX 400) Tablet 800-1,600 mg, ORAL, PRN  Magnesium Sulfate 2 grams in 50 mL IVPB, IV, PRN  Naloxone (NARCAN) Injection 0.1 mg, IV, PRN  Ondansetron (ZOFRAN) Injection 4 mg, IV, Q8H PRN  Potassium Chloride 10 % Liquid 20-60 mEq, ORAL, PRN        Current Vital Signs  Temp: 36.2 C (97.2 F) (10/29 0800)  Temp src: Tympanic (10/29 0800)  Pulse: 79 (10/29 0800)  BP: 129/57 mmHg (10/29 0800)  Resp: 12 (10/29 0800)  SpO2: 97 % (10/29 0800)  Height: 172.7 cm (5' 7.99") (10/29 0600)  Weight: 65 kg (143 lb 4.8 oz) (10/29 0600)    24 Hour Vital Signs  Temp src:  [-]   Temp:  [27 C (80.6 F)-37.1 C (98.8 F)]    Pulse:  [44-94]   BP: (120-169)/(48-75)   Resp:  [6-20]   SpO2:  [91 %-100 %]     I/O Last 2 Completed Shifts:  In: 52.8 [Oral:850; Crystalloid:3713.8]  Out: 3685 [Urine:3325; Other:360]    PHYSICAL EXAM:  Neuro Exam:   E4M6V5     MAP >85 requiring dopamine 19     Motor:   BUE 5/5    Hip Flexion Knee Extension Knee Flexion Dorsiflexion Great Toe Extension Plantar Flexion   Right 4 4 4 4  4+ 4+   Left 4+ 4+ 4+ 4+ 4+ 4+     Dressing clean dry intact     HV 1 (left): 135  HV 2 (right): 145    ARTERIAL BLOOD GASES   Recent labs for the past 48 hours     04/04/13 1654    PO2, ART 336*    O2 SAT, ART 99.8    PCO2, ART 40.0    PH, ART 7.42    HCO3, ART 25.5    BASE EXCESS, ART 1.2      Labs:  CBC   Recent labs for the past 72 hours     04/06/13 0635 04/04/13 1513 04/04/13 1121    WHITE BLOOD CELL COUNT 11.9* 6.1 6.2    HEMOGLOBIN 13.4* 15.4 15.0  HEMATOCRIT 38.5* 44.5 43.4    PLATELET COUNT 237 244 241      CHEM 20   Recent labs for the past 48 hours     04/06/13 0635 04/04/13 1513 04/04/13 1121    SODIUM 136 137 136    POTASSIUM 3.8 4.4 4.5    CHLORIDE 105 103 103    CARBON DIOXIDE TOTAL 24 28 27     CREATININE BLOOD 0.56 0.72 0.70    UREA NITROGEN, BLOOD (BUN) 8 7* 7*    GLUCOSE 134* 82 101*    CALCIUM 8.7 9.6 9.5    PROTEIN -- -- --    ALBUMIN -- -- --    ALKALINE PHOSPHATASE (ALP) -- -- --    ASPARTATE TRANSAMINASE (AST) -- -- --    BILIRUBIN TOTAL -- -- --    ALANINE TRANSFERASE (ALT) -- -- --    TRIGLYCERIDE -- -- --    URIC ACID, BLOOD -- -- --    LACTATE DEHYDROGENASE -- -- --    PHOSPHORUS (PO4) -- 2.0* --    CHOLESTEROL -- -- --    MAGNESIUM (MG) -- 1.9 --      SURGICAL DIC   Recent labs for the past 48 hours     04/06/13 0635 04/04/13 1519    INR 1.02 0.96    APTT 29.2 32.5    FIBRINOGEN -- --    D-DIMER -- --      PLAN:   Neuro  1) T12 burst fracture, kyphotic deformity Sp T12-L2 posterior fusion  -Pain control  -Serial neuro exams  -TLSO brace fitted  and at bedside.  -follow HV output  -Postop CT with good correction and adequate instrumentation and decompression at T12.  -Wean PCA today, transition to PO meds.     -MAP therapy  -goal>85 day 2/?   -PRN dopamine  -PICC placed 10/28 for central access     CV: HDS. Dopamine for MAP goals  Pulm: satting well on RA  GI: Regular diet. Routine bowel care.  GU: Foley for accurate I/O while on pressor  FEN: NS at 125cc/hr, replete electrolytes PRN  DVT: SCDs. SQH   ID: Afebrile, no leukoyctosis  Misc: PT.OT, PMR consults today.   Lines: Aline 10/29, PICC 10/28    Lissa Hoard, M.D. PGY2  [place]  NSG Svc Pager: [phone]  Direct Pager [phone]  PI# [phone]      This patient was seen, evaluated, and care plan was developed with the resident.  I agree with the assessment and plan as outlined in the resident's note.  Report electronically signed by Fernande Bras, MD. Attending

## 2013-04-06 NOTE — Allied Health Consult (Signed)
PM& R -- ACUTE CARE SERVICE   OCCUPATIONAL THERAPY EVALUATION  Name: Carl Mata  MRN: [id number]   Date of Service: 04/06/2013 Primary Service: (A) Neurosurgery    Time In: 1500    Total Time (mins): 30      INTAKE INFORMATION AND HISTORY:  Therapy Consult(s) Ordered: Physical Therapy and Occupational Therapy  Date of Admission: 04/04/2013 Date of Onset (Medicare only): 04/04/2013   Diagnosis: Chronic T12 burst fracture, with acute presentation of cauda equina syndrome   Precautions: Fall precautions, TLSO for OOB, and Maintain MAP >85  Language: Occupational hygienist Used: no    History of Present Illness/Injury (Including pertinent test results & procedures): Per MD note from 04/04/2013: "Carl Mata is a 37yr old male T 12 burst fracture s/p in June of 2014 who presents with progressively worsening lower back pain, weakness, saddle anesthesia, and intermittent urinary incontinence. Patient also has acute onset of hyperesthesias since yesterday morning. MRI shows worsening kyphotic deformity with stable but significant canal compromise. "    Procedure Performed/Description on 04/04/2013:   1. T12 decompressive laminectomy  2. T10 to L2 posterior arthrodesis/instrumentation using pedicle screwes x 8, thoracolumbar rods x 2, cross-connector x 1 with in-situ autograft, cancellous allograft and DBM  3. Partial reduction of kyphotic deformity   Past Medical/Surgical History:   Past Medical History   Diagnosis Date    Unspecified mental or behavioral problem      bipolar    Unspecified asthma(493.90)       Past Surgical History   Procedure Laterality Date    Fusion thoracic posterior N/A 04/04/2013     Procedure: FUSION THORACIC POSTERIOR;  Surgeon: Fernande Bras, MD;  [facility];  Service: Neurosurgery    Laminectomy thoracic posterior N/A 04/04/2013      Procedure: LAMINECTOMY THORACIC POSTERIOR;  Surgeon: Fernande Bras, MD;  [facility];  Service: Neurosurgery     Social history   Reports that he is on parol in [place], but that his family and friends live "in other cities".   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   Social History Main Topics    Smoking status: Current Every Day Smoker -- 0.50 packs/day for 20 years     Types: Cigarettes    Smokeless tobacco: Not on file    Alcohol Use: No    Drug Use: Yes     Special: Methamphetamine    Sexual Activity: Not on file       SUBJECTIVE EXAM:  Current Living Situation: Alone  Support at Time of Discharge: TBD    Support Available At All Times?: no  Environment at Discharge: Was living at a room and board, but patient reports he is currently homeless.  Environmental Barriers:  TBD  Assistive Devices Owned: none  Adaptive Equipment Owned: none  Prior Level of Function: fully independent   Current Mental Status: Alert and oriented x4  Pain Level / Chief Complaint: 8  /10     Location: back  Pain Interferes With Function?:yes    OBJECTIVE EXAMINATION:  OT orders received, chart reviewed and evaluation completed with the following findings:   Observation: Pre-Position: supine in bed, Post-Position: supine in bed,  Vitals IN: HR=77, O2=98, RR15, Vitals OUT: HR=88, O2=99, RR=12    FUNCTIONAL AREAS  MMT>3/5, grip=10#, pinch=2psi RIGHT UPPER EXTREMITY   LEFT UPPER EXTREMITY     Shoulder Strength / Muscle Tone  At least 3-4/5 not formally tested due to spine surgery At least 3-4/5 not formally tested due to spine surgery   Elbow Strength / Muscle Tone  (C5: flexion, C7: extension)   At least 3-4/5 not formally tested due to spine surgery At least 3-4/5 not formally tested due to spine surgery   Wrist Strength / Muscle Tone  (C6: extension) At least 3-4/5 not formally tested due to spine surgery At least 3-4/5 not  formally tested due to spine surgery   Hand Strength / Muscle Tone  (C8: #3 flex, T1: #5 abduction) At least 3-4/5 not formally tested due to spine surgery At least 3-4/5 not formally tested due to spine surgery   UE ROM Limitations none none   Sensation intact intact   Coordination intact intact   Tissue / Edema unremarkable unremarkable   Areas Unable to Reach feet feet   Objects Difficult to Handle none none   Special Tests     Precautions / Comments:         COGNITIVE FUNCTION   Functional Status LAWTON IADL SCALE  (Score 0=unable or 1=independent) Total Score: 4/8   Arousal / Consciousness decreased Telephone Use 1   Memory / STM / LTM Recall NT Shopping 0   Attention Decreased due to pain and meds Food Prep 0   Safety Awareness / Judgment intact Housekeeping 0   Planning / Problem-Solving intact Laundry 0   Emergency (911) intact Transportation 1   Follow Commands intact Medications 1   Visual-Perception NT Handle Finances 1   Communication (verbal, aphasia, HOH, etc.) intact Help Available for Tasks? yes   COMMENTS:      ADDITIONAL ASSESSMENTS:     VISUOMOTOR SKILLS: (glasses, acuity, convergence, scan, track, depth, figure ground, spatial, field of vision)  Not tested       ADL TASK CHART: Note any Limiting Factors, Required Intervention PRE- ADMIT  LEVEL     GOAL  LEVEL   14 days Eval  04/06/2013  Unit: NSICU   Lower Body Dressing I Modify I max   Upper Body Dressing I Modify I mod   Toileting, Continence & Hygeine I Modify I mod   Bathing I Modify I mod   Upper Body Grooming I I sink side I (seated)   Self-Feeding Ability I I sink side I (seated)   MOBILITY TASKS ITEVEL EL    Bed Mobility I Modify I S   Bed / Chair Transfers I Modify I min   Bathroom Transfers I Modify I min   Functional Reach Test   (High Fall Risk < 7 in) NA >7 NT   Activity Level / Activity Tolerance / Perceived Rate of Exertion (PRE):  active moderate Poor(due to postop recovery)   Orthotic / Splint NA Modify I (TLSO) Max(TLSO)    Precautions NA 3/3 0/3   Comments / Other Tests:   (TUG >12secs Fall Risk)        ASSESSMENT:  These presentations  DO interfere with ability to engage in selfcare, IADL and occupational tasks versus prior level of function.      Pt is a 37yr old individual with referring diagnosis of  sp T10-L2 posterior fusion, T12 laminectomy  who presents with the following:   Problem deficits in: postop precautions , lack of condition knowledge, decreased balance and activity tolerance, pain  These problems limit: functional mobility, orthotics management, lowerbody dressing, bed mobility.  Skilled OT Intervention: patient education on Chief Financial Officer, facilitate balance reachtion  Pt Functional Performance and/or Change: limited participation this treatment - difficulty sustaining MAP >85     RECOMMENDATIONS:   ANTICIPATED POST-ACUTE RECOMMENDATIONS WHEN MEDICALLY STABLE: placement for skilled needs   DME NEEDS: tba  (needs may change through course of medical recovery)  Post-Acute OT/PT/SLP Rehab Needs: continue OT 5-6x/wk  Burden of Care At Present Condition: maximal assistance for activities of daily living and minimal assistance for functional mboility  Activity Tolerance for Rehab:  good    MOBILITY PHASE: 4     Pt would benefit from skilled OT to address deficit areas, safety, prevent complications and restore pt to or near prior level of function with necessary strategies and adaptations.    GOALS / TREATMENT PLAN / FUNCTIONAL PROGNOSIS:  Patient's Goals: Pt states " get pain controlled so I can do things on my own "    Patient Will Be Seen By OT for Intervention: yes  Rehab Prognosis: Good     Recommended Frequency of Treatment: 1-2 times per day  Recommended Duration of Treatment:  5-14 days then reassess    Occupational Therapy Goals: Prior To Discharge Patient Will Demonstrate:   Short Term Goals: within 5 days  ADL/IADL: Pt will demonstrate the physical and cognitive ability for increased  independence in selfcare skills as evidenced by improved levels in the ADL TASK CHART and/or LAWTON IADL INDEX by 1 grade maintaining vitals within therapeutic range and without desaturation.  ACTIVITY TOLERANCE: Pt will increase physical and/or cognitive/psychosocial endurance in order to participate in 30 mins of light resistance activities with an increase of 5 mins daily.  TRANSFERS/CHANGING POSITIONS/BALANCE: Pt will increase level of independence with bathroom and household surface transfers by 1 grade.  PATIENT/CAREGIVER EDUCATION: Pt/Caregiver will ID home environmental adaptations for observing necessary precautions, fall reduction and task efficiency program.    PRECAUTIONS: Pt will verbalize and/or demonstrate compliance with restrictions and precautions to activities as noted by therapist observation or caregiver/patient self-report.  SPLINT/ORTHOTIC: Pt will wear TLSO orthotic as recommended observing wear, care, application and precautions to allow correct healing/protection of: thoracic spine      Long Term Goals: within 14 days  ADL/IADL: Pt will demonstrate the physical and cognitive ability for increased independence in selfcare skills as evidenced by improved levels in the ADL TASK CHART and/or LAWTON IADL INDEX in the selfcare / living activities areas noted in the functional performance grid above maintaining vitals within therapeutic range and without desaturation.  ACTIVITY TOLERANCE: Pt will increase physical and/or cognitive/psychosocial endurance in order to participate in 60 mins of light resistance activities with an increase of 5 mins daily. -  TRANSFERS/CHANGING POSITIONS/BALANCE: Pt will increase level of independence with bathroom and household surface transfers by 2 grades.  BIOMECHANICS: Pt will describe and/or exhibit correct technique to apply proper body mechanics when performing functional activities of selfcare. -     Treatment Plan:    Orthotic Training,  Adaptive Equipment,  Durable Medical Equipment,  Sitting Balance, Bed Mobility Training, Transfer Training, Dynamic Balance, Functional Mobility,  ADL / Self Care, IADL, Therapeutic Exercise, Energy Conservation / Work Simplification, Patient / Sales promotion account executive and Home Exercise Program    Patient / Caregiver Participation / Education  Patient / Engineer, structural Education Today: Plan of Care, Role of OT  Method of Teaching: verbal   Learner: patient Response: verbalizes understanding  Has the plan of care been explained to the patient / caregiver?yes  Is the patient able to understand the plan of care?yes  Does  the patient / caregiver(s) agree with the plan of care?yes  List barriers that may interfere with plan of care:  None   Comments:     Interim Report Due: 04/20/2013     Plan of Care Re-Certification Due (90 Days from original Plan of Care: 05/06/2013     Patient seen for additional occupational therapy treatment; see 04/06/2013  occupational therapy progress note for details.    x   No additional treatment rendered this encounter.     Reported by:  Dot Been, M.O.T., O.T.R./L   Occupational Therapist PI# [phone]  Dept. Of Physical Medicine & Rehabilitation / Acute Care Services  Pager Number / OT Scheduling [phone]  Vocera [phone]

## 2013-04-06 NOTE — Procedures (Signed)
ADULT LINE PROCEDURE NOTE WITH CLIP NOTIFICATION    MRN: [id number] Carl Mata  DOB: 1975-07-29 Date of Service: 04/06/2013    02:21   Date of Admission: 04/04/2013 10:18 AM  Patient's PCP: No Pcp No Pcp     Patient's specific hospital location during procedure: [facility]   Consent form completed and signed by the patient/guardian: Emergency  Was an interpreter used? no  ID verified by two sources (select any two from list): MRN, DOB and Name  Was this an emergency procedure?  yes    Reason for Insertion/Pre-Procedure Diagnosis: New indication for line  Post Procedure Diagnosis: same  Surgeon/Line Inserter:  Izic Stfort, MD      TYPE OF PROCEDURE: ARTERIAL LINE PLACEMENT    Occupation of inserter: Resident  Procedural Pause Performed: yes  Skin Prep: Chlorhexidine  Was skin prep completely dry at time of first skin puncture yes  Sterility:  were maximal barrier precautions used for sterility (including handwashing, cap, full sterile drape, gloves, gown and mask):  yes  Type of Anesthesia/Local Anesthetic: 1% lidocaine 5 mL    Artery: Radial right  Estimated blood loss: minimal  Confirmation of Placement: Transduced Arterial Waveform  Complications: None  Did this insertion attempt result in a successful line placement? yes    CLIP notification not required - arterial line only    Lissa Hoard, MD   Resident  Pager#: [phone]

## 2013-04-06 NOTE — Allied Health Procedure (Signed)
Phase Summary - Mobility Pilot    Mobility Guidelines    Last Documented Mobility Phase:      Current Phase: Phase 4    Mobility Session Initiated?:Yes    Mobility Session Completed? Yes      Island Dohmen, DPT  Physical Therapist I  [facility]  Department of PM&R  Acute Care Section  Office: [phone]  Vocera: [phone]

## 2013-04-06 NOTE — Progress Notes (Addendum)
CRITICAL CARE PROGRESS NOTE  Carl Mata  MRN [id number]   LOS: 2 days   Data Unavailable,[id number]  Date of Admission 04/04/2013  Date of Service 04/06/2013  CODE: Full                               ID: 37 yo M w/ hx of fall resulting in T12 compression fracture that progressed leading to symptomatic cord compression necessitating emergent T10-L2 posterior fusion, T12 laminectomy on 04/04/2013. Now in ICU for dopamine gtt for increased perfusion to spinal cord.     INTERVAL HX:  PICC line placed for dopamine gtt  Low vitamin D, normal PTH    SUBJECTIVE:  Endorses back pain that is controlled after prn dilaudid. No chest pain, sob, cough, orthopnea, LE edmea, or new onset paresthesias.      ROS unchanged from previous note except as noted in the interval history.     MEDICATIONS:  Scheduled Medications  Docusate (COLACE) Capsule 100 mg, ORAL, BID  Heparin PF 5,000 units/0.5 mL Injection 5,000 Units, SUBCUTANEOUS, Q8H  Lidocaine (XYLOCAINE) 10 mg/mL (1%) Injection 20 mL, INFILTRATION, AT BEDSIDE  Polyethylene Glycol 3350 (MIRALAX) Oral Powder Packet 17 g, ORAL, QAM  Sennosides (SENOKOT) Tablet 17.2 mg, ORAL, Daily Bedtime    IV Medications  DOPamine, 2-20 mcg/kg/min, IV, CONTINUOUS, Last Rate: 19 mcg/kg/min (04/06/13 0600)  Hydromorphone, , IV, PCA  NaCl 0.9% 1,000 mL with Potassium Chloride 20 mEq, , IV, CONTINUOUS, Last Rate: 20 mL/hr at 04/06/13 0600    PRN Medications  Bisacodyl (DULCOLAX) Suppository 10 mg, RECTALLY, Q24H PRN  Calcium Carbonate (OS-CAL) Tablet 2,500-5,000 mg, ORAL, PRN  Calcium Gluconate 3,000 mg in 0.9% NaCl 100 mL IVPB 3,000 mg, IV, PRN  Diazepam (VALIUM) Injection 2.5-5 mg, IV, Q4H PRN  Hydrocodone 5 mg/Acetaminophen 325 mg (NORCO  5) Tablet 1-2 tablet, ORAL, Q4H PRN  Hydromorphone (DILAUDID) Injection 1-1.5 mg, IV, Q3H PRN  Magnesium Hydroxide (MILK OF MAGNESIA) 400 mg/5 mL Suspension 30 mL, ORAL, Q12H PRN  Magnesium Oxide (MAG-OX 400) Tablet 800-1,600 mg, ORAL, PRN  Magnesium Sulfate 2  grams in 50 mL IVPB, IV, PRN  Naloxone (NARCAN) Injection 0.1 mg, IV, PRN  Ondansetron (ZOFRAN) Injection 4 mg, IV, Q8H PRN  Potassium Chloride 10 % Liquid 20-60 mEq, ORAL, PRN      ALLERGIES:   Allergies   Allergen Reactions    Nsaids (Non-Steroidal Anti-Inflammatory Drug) Swelling       VITAL SIGNS  Summary  Temp Min: 27 C (80.6 F) Max: 37.1 C (98.8 F)  BP: (120-150)/(48-72)   Pulse Min: 44 Max: 94  Resp Min: 6 Max: 24  SpO2 Min: 91 % Max: 100 %      Current Vitals  Temp: 36.3 C (97.3 F)  BP: 138/65 mmHg  Pulse: 66  Resp: 10  SpO2: 95 %      Weight: 64.7 kg (142 lb 10.2 oz)       Intake/Output Summary (Last 24 hours) at 04/06/13 0703  Last data filed at 04/06/13 0600   Gross per 24 hour   Intake 4394.93 ml   Output   3685 ml   Net 709.93 ml       ABG   No results found for this basename: ARTPH:*,ARTPCO2:*,ARTPO2:*,ARTHCO3:*,ARTO2SAT:*,ARTBE:* in the last 12 hours    PHYSICAL EXAM  GENERAL: NAD. Sitting up in bed, drinking water. A/o x 3.  HEENT: NC/AT. PERRL. EOMI. Anicteric. MMM. OP Clear.  Neck Supple.   LYMPH: no cervical or supraclavicular LAD  CHEST: CTAB, no w/r/r  HEART: RRR, no murmurs  ABDOMEN: BS+, Soft, NT/ND. No HSM.   EXT: WWP. No e/c/c  NEURO: CN2-12 grossly intact. Moves all four extremities.   PSYCH: normal affect  SKIN: no rashes, multiple tattoos.     CBC Recent labs for the past 48 hours     04/06/13 0635 04/04/13 1513 04/04/13 1121    WHITE BLOOD CELL COUNT 11.9* 6.1 6.2    HEMOGLOBIN 13.4* 15.4 15.0    HEMATOCRIT 38.5* 44.5 43.4    PLATELET COUNT 237 244 241      CHEM 20 Recent labs for the past 48 hours     04/04/13 1513 04/04/13 1121    SODIUM 137 136    POTASSIUM 4.4 4.5    CHLORIDE 103 103    CARBON DIOXIDE TOTAL 28 27    CREATININE BLOOD 0.72 0.70    UREA NITROGEN, BLOOD (BUN) 7* 7*    GLUCOSE 82 101*    CALCIUM 9.6 9.5    PROTEIN -- --    ALBUMIN -- --    ALKALINE PHOSPHATASE (ALP) -- --    ASPARTATE TRANSAMINASE (AST) -- --    BILIRUBIN TOTAL --  --    ALANINE TRANSFERASE (ALT) -- --    TRIGLYCERIDE -- --    URIC ACID, BLOOD -- --    LACTATE DEHYDROGENASE -- --    PHOSPHORUS (PO4) 2.0* --    CHOLESTEROL -- --    MAGNESIUM (MG) 1.9 --      Results for Carl Mata (MRN [id number]) as of 04/06/2013 07:04   Ref. Range 04/05/2013    PARATHYROID HORMONE INTACT Latest Range: 12-88 pg/mL 78   VITAMIN D, 25 HYDROXY Latest Range: 30.0-100.0 ng/mL 26.5 (L)       IMAGING:  I have personally reviewed the following images with Dr. Melynda Keller  CXR with PICC line in RA. No e/o infiltrate, effusion, or pneumothorax. Spinal hardware in place.     IMPRESSION / RECS:  37 yo M w/ hx of fall resulting in T12 compression fracture that progressed leading to symptomatic cord compression necessitating emergent T10-L2 posterior fusion, T12 laminectomy on 04/04/2013.      # T12 cord compression: In order to attain increased perfusion to the spinal cord dopamine is running for a goal MAP of 85 for the next week. PTH normal at 78. Vitamin D low at 26.5  - neurosurgical service plans to give dopamine for a 7 day course (start date 10/27)  - agree with PCA for pain as well as bowel regimen  - anticipate conversion to orals in near future.  - recommend starting vitamin D 50,000 international unit(s) once weekly x 8 weeks and then re-checking vitamin D levels.       # history of tobacco abuse: currently no respiratory symptoms. SpO2 > 93% on RA. No wheezing on exam.   - robinul is not indicated at this time  - should patient develop wheezing would recommend combivent  - recommend ongoing utilization of incentive spirometer      VAP Prophylaxis: not intubated  DVT Prophylaxis: SQ heparin  GI Prophylaxis: Not indicated  LDA: PIV x 3, R radial A line (10/27), L & R thoracic spine drain (10/27), R PICC (10/28)  Nutrition: regular diet    Pt seen & discussed with Dr. Melynda Keller  We will continue to follow. Feel free to call if any acute issues or  questions.      Electronically signed by:   Geri Seminole  PCCM Fellow  Pager: [phone]  On-call Pager: [phone] (weekdays 7 PM - 7 AM & weekends)     This patient was seen, evaluated, and care plan was developed with the resident.  I agree with the assessment and plan as outlined in the resident's note.  Report electronically signed by Vernon Prey, MD. Attending

## 2013-04-06 NOTE — Nurse Assessment (Signed)
ASSESSMENT NOTE    Note Started: 04/06/2013, 20:27     Initial assessment completed and recorded in EMR.  Report received from day shift nurse and orders reviewed. Plan of Care reviewed and appropriate, discussed with patient.  Karma Ganja, RN RN

## 2013-04-06 NOTE — Progress Notes (Addendum)
DEPARTMENT OF NEUROLOGICAL SURGERY  NEUROCRITICAL CARE PROGRESS NOTE      Name: Carl Mata  MRN: [id number]  Date:   04/07/2013 POD 3 sp T10-L2 posterior fusion, T12 laminectomy     INTERVAL SUMMARY:   PMR consulted, recs pending  Foley replaced  Left radial a-line replaced    IV MEDICATIONS  DOPamine, 2-20 mcg/kg/min, IV, CONTINUOUS, Last Rate: 20 mcg/kg/min (04/07/13 0505)  Hydromorphone, , IV, PCA  NaCl 0.9% 1,000 mL with Potassium Chloride 20 mEq, , IV, CONTINUOUS, Last Rate: 20 mL/hr at 04/07/13 0506      SCHEDULED MEDICATIONS  Docusate (COLACE) Capsule 100 mg, ORAL, BID  Heparin PF 5,000 units/0.5 mL Injection 5,000 Units, SUBCUTANEOUS, Q8H  Lidocaine (XYLOCAINE) 10 mg/mL (1%) Injection 20 mL, INFILTRATION, AT BEDSIDE  Polyethylene Glycol 3350 (MIRALAX) Oral Powder Packet 17 g, ORAL, QAM  Sennosides (SENOKOT) Tablet 17.2 mg, ORAL, Daily Bedtime      PRN MEDICATIONS  Bisacodyl (DULCOLAX) Suppository 10 mg, RECTALLY, Q24H PRN  Calcium Carbonate (OS-CAL) Tablet 2,500-5,000 mg, ORAL, PRN  Calcium Gluconate 3,000 mg in 0.9% NaCl 100 mL IVPB 3,000 mg, IV, PRN  Diazepam (VALIUM) Injection 2.5-5 mg, IV, Q4H PRN  Hydrocodone 5 mg/Acetaminophen 325 mg (NORCO  5) Tablet 1-2 tablet, ORAL, Q4H PRN  Hydromorphone (DILAUDID) Injection 1-2 mg, IV, Q2H PRN  Magnesium Hydroxide (MILK OF MAGNESIA) 400 mg/5 mL Suspension 30 mL, ORAL, Q12H PRN  Magnesium Oxide (MAG-OX 400) Tablet 800-1,600 mg, ORAL, PRN  Magnesium Sulfate 2 grams in 50 mL IVPB, IV, PRN  Naloxone (NARCAN) Injection 0.1 mg, IV, PRN  Ondansetron (ZOFRAN) Injection 4 mg, IV, Q8H PRN  Potassium Chloride 10 % Liquid 20-60 mEq, ORAL, PRN        Current Vital Signs  Temp: 36.7 C (98.1 F) (10/30 0500)  Temp src: Tympanic (10/30 0500)  Pulse: 86 (10/30 0500)  BP: 127/68 mmHg (10/30 0500)  Resp: 9 (10/30 0500)  SpO2: 93 % (10/30 0500)  Height: 172.7 cm (5' 7.99") (10/29 0600)  Weight: 65 kg (143 lb 4.8 oz) (10/29 0600)    24 Hour Vital Signs  Temp src:  [-]   Temp:  [36.2  C (97.2 F)-36.7 C (98.1 F)]   Pulse:  [66-106]   BP: (91-169)/(57-122)   Resp:  [7-20]   SpO2:  [93 %-100 %]     I/O Last 2 Completed Shifts:  In: 2474.2 [Oral:850; Crystalloid:1624.2]  Out: 4835 [Urine:4575; Other:260]    PHYSICAL EXAM:  Neuro Exam:   [id number]   PERRL  Motor:   BUE 5/5    Hip Flexion Knee Extension Knee Flexion Dorsiflexion Great Toe Extension Plantar Flexion   Right 4+ 4+ 4+ 5 5 5    Left 4+ 4+ 4+ 5 5 5      Sensation grossly intact to LT     HV 1 (left): 15  HV 2 (right): 150    MAP >85 requiring dopamine 19    ARTERIAL BLOOD GASES     No results found for this basename: ARTPO2:*,ARTO2SAT:*,ARTPCO2:*,ARTPH:*,ARTHCO3:*,ARTBE:* in the last 48 hours   Labs:  CBC   Recent labs for the past 72 hours     04/06/13 2330 04/06/13 0635 04/04/13 1513 04/04/13 1121    WHITE BLOOD CELL COUNT 11.8* 11.9* 6.1 6.2    HEMOGLOBIN 13.8* 13.4* 15.4 15.0    HEMATOCRIT 39.6* 38.5* 44.5 43.4    PLATELET COUNT 235 237 244 241      CHEM 20   Recent labs for the past  48 hours     04/06/13 2330 04/06/13 0635    SODIUM 133* 136    POTASSIUM 4.0 3.8    CHLORIDE 99 105    CARBON DIOXIDE TOTAL 27 24    CREATININE BLOOD 0.55 0.56    UREA NITROGEN, BLOOD (BUN) 9 8    GLUCOSE 138* 134*    CALCIUM 8.9 8.7    PROTEIN -- --    ALBUMIN -- --    ALKALINE PHOSPHATASE (ALP) -- --    ASPARTATE TRANSAMINASE (AST) -- --    BILIRUBIN TOTAL -- --    ALANINE TRANSFERASE (ALT) -- --    TRIGLYCERIDE -- --    URIC ACID, BLOOD -- --    LACTATE DEHYDROGENASE -- --    PHOSPHORUS (PO4) -- --    CHOLESTEROL -- --    MAGNESIUM (MG) -- --      SURGICAL DIC   Recent labs for the past 48 hours     04/06/13 2330 04/06/13 0635    INR 1.04 1.02    APTT 29.2 29.2    FIBRINOGEN -- --    D-DIMER -- --      PLAN:   Neuro  1) T12 burst fracture, kyphotic deformity Sp T12-L2 posterior fusion  - Pain control   - Serial neuro exams  - TLSO brace fitted and at bedside.  - follow HV output; consider d/c'ing left HV  today  - Postop CT with good correction and adequate instrumentation and decompression at T12.  - PTOT  -Wean PCA today, transition to PO meds.     -MAP therapy  -goal>85 day 3/3; d/c MAP therapy today  -PRN dopamine  -PICC placed 10/28 for central access     CV:  HDS. Dopamine for MAP goal>85  Pulm: Stable on RA  GI:  Regular diet. Routine bowel care.  GU:  Foley for accurate I/O while on pressor   FEN:  NS at 125cc/hr, replete electrolytes PRN  DVT:  SCDs. SQH  ID:  Afebrile, no leukoyctosis  Misc:  PT/OT, PMR consulted: recs pending  Lines:  Aline 10/29, PICC 10/28    Jarrett Soho, MD, PhD   Licensed Resident, PGY6  Department of Neurological Surgery  PI# [id number]  Pager #: [phone] Pager #: [phone]      This patient was seen, evaluated, and care plan was developed with the resident.  I agree with the assessment and plan as outlined in the resident's note.  Report electronically signed by Fernande Bras, MD. Attending

## 2013-04-07 ENCOUNTER — Encounter: Payer: Self-pay | Admitting: Physical Medicine & Rehabilitation

## 2013-04-07 LAB — BASIC METABOLIC PANEL
Calcium: 8.9 mg/dL (ref 8.6–10.5)
Carbon Dioxide Total: 27 meq/L (ref 24–32)
Chloride: 99 meq/L (ref 95–110)
Creatinine Blood: 0.55 mg/dL (ref 0.44–1.27)
E-GFR, African American: >60 SEE NOTE (ref >60)
E-GFR, Non-African American: >60 SEE NOTE (ref >60)
Glucose: 138 mg/dL — ABNORMAL HIGH (ref 70–99)
Potassium: 4 meq/L (ref 3.3–5.0)
Sodium: 133 meq/L — ABNORMAL LOW (ref 135–145)
Urea Nitrogen, Blood (BUN): 9 mg/dL (ref 8–22)

## 2013-04-07 LAB — INR: INR: 1.04 (ref 0.87–1.18)

## 2013-04-07 MED ORDER — MIDODRINE 5 MG TABLET
7.5000 mg | ORAL_TABLET | Freq: Three times a day (TID) | ORAL | Status: DC | PRN
Start: 2013-04-07 — End: 2013-04-07

## 2013-04-07 MED ORDER — HYDROMORPHONE 1 MG/ML INJECTION SYRINGE
1.0000 mg | INJECTION | INTRAMUSCULAR | Status: DC | PRN
Start: 2013-04-07 — End: 2013-04-08
  Administered 2013-04-07 – 2013-04-08 (×4): 1 mg via INTRAVENOUS
  Filled 2013-04-07: qty 1
  Filled 2013-04-07: qty 2
  Filled 2013-04-07 (×2): qty 1

## 2013-04-07 MED ORDER — NACL 0.9% IV BOLUS - DURATION REQ
500.0000 mL | INTRAVENOUS | Status: DC | PRN
Start: 2013-04-07 — End: 2013-04-09

## 2013-04-07 MED ORDER — MIDODRINE 5 MG TABLET
7.5000 mg | ORAL_TABLET | Freq: Three times a day (TID) | ORAL | Status: DC | PRN
Start: 2013-04-07 — End: 2013-04-09
  Filled 2013-04-07: qty 1

## 2013-04-07 MED ORDER — LIDOCAINE 2 % MUCOSAL JELLY IN APPLICATOR
1.0000 | Freq: Once | Status: DC
Start: 2013-04-07 — End: 2013-04-09
  Filled 2013-04-07: qty 1

## 2013-04-07 MED ORDER — MIDODRINE 5 MG TABLET
5.0000 mg | ORAL_TABLET | Freq: Three times a day (TID) | ORAL | Status: DC | PRN
Start: 2013-04-07 — End: 2013-04-07
  Administered 2013-04-07: 5 mg via ORAL
  Filled 2013-04-07: qty 1

## 2013-04-07 MED ORDER — ERGOCALCIFEROL (VITAMIN D2) 1,250 MCG (50,000 UNIT) CAPSULE
50000.0000 [IU] | ORAL_CAPSULE | ORAL | Status: DC
Start: 2013-04-07 — End: 2013-04-09
  Administered 2013-04-07: 50000 [IU] via ORAL
  Filled 2013-04-07: qty 1

## 2013-04-07 NOTE — Nurse Focus (Signed)
Report called to E5 PTs SBP stable, only C/O pain . See EMR

## 2013-04-07 NOTE — Nurse Assessment (Signed)
ASSESSMENT NOTE    Note Started: 04/07/2013, 22:01     Initial assessment completed and recorded in EMR.  Report received from day shift nurse and orders reviewed. Plan of Care reviewed and updated and appropriate, discussed with patient. 37 yo male POD #3 sp T10-L2 posterior fusion, T12 laminectomy. Pt is alert and oriented to self, place and situation. Pt does not remember the day. Pt denies any numbness or tingling at this time. Hemovac intact and patent. Drsg has old shadow of drainage. Will monitor for any new bleeding. Foley dc'd today. Pt able to void using urinal. Will monitor PVR and I&O cath prn. Pain assessed and managed with multiple pain medications. Pt ambulatory with FWW. Pt has TLSO brace at bedside. Right PICC line intact and patent. Regular diet, patient tolerating well. Bed locked, call light in reach. Will continue to monitor.  Gustavus Messing, RN

## 2013-04-07 NOTE — Allied Health Procedure (Signed)
ICU Program Daily Summary    Last Documented Mobility Phase: Phase 4    Current Mobility Phase: Phase 4      Autumm Hattery, DPT  Physical Therapist I  [facility]  Department of PM&R  Acute Care Section  Office: [phone]  Vocera: [phone]

## 2013-04-07 NOTE — Nurse Focus (Signed)
Attempted to give report to [facility] 5 charge Nurse Deberah Castle. Refused report at this time.

## 2013-04-07 NOTE — Plan of Care (Signed)
Problem: General Plan of Care  Goal: Individualization/Patient-Specific Goal  The patient and/or their representative will achieve their patient-specific goals related to the plan of care.    The patient-specific goals include:  Get better and go home  Outcome: Progressing  Intervention: Interventions  Patient-specific interventions include pain assessed and managed with multiple pain medications, fall precautions, infection prevention,

## 2013-04-07 NOTE — Allied Health Progress (Signed)
Occupational Therapy Progress Note    Date of Service: 04/07/2013     Patient not available for treatment this AM - currently tritrating off BPmedication.  Nursing recommending to defer out of bed until this PM.    Daveigh Batty [place], [place]., O.T.R./L   Occupational Therapist PI# [phone]  Dept. Of Physical Medicine & Rehabilitation / Acute Care Services  Pager Number / OT Scheduling [phone]  Vocera [phone]

## 2013-04-07 NOTE — Nurse Assessment (Signed)
ASSESSMENT NOTE    Note Started: 04/07/2013, 10:26     Initial assessment completed and recorded in EMR.  Report received from night shift nurse and orders reviewed. Plan of Care reviewed and appropriate, discussed with patient.  Frankey Shown,  RN

## 2013-04-07 NOTE — Progress Notes (Addendum)
CRITICAL CARE PROGRESS NOTE  Carl Mata  MRN [id number]   LOS: 3 days   Data Unavailable,[id number]  Date of Admission 04/04/2013  Date of Service 04/07/2013  CODE: Full                               ID: 37 yo M w/ hx of fall resulting in T12 compression fracture that progressed leading to symptomatic cord compression necessitating emergent T10-L2 posterior fusion, T12 laminectomy on 04/04/2013. Now in ICU for dopamine gtt for increased perfusion to spinal cord.     INTERVAL HX:  No acute events. Foley replaced and L radial a-line placed.     SUBJECTIVE:  Endorses back pain that is controlled after prn dilaudid. No chest pain, sob, cough, orthopnea, LE edmea, or new onset paresthesias.      ROS unchanged from previous note except as noted in the interval history.     MEDICATIONS:  Scheduled Medications  Docusate (COLACE) Capsule 100 mg, ORAL, BID  Heparin PF 5,000 units/0.5 mL Injection 5,000 Units, SUBCUTANEOUS, Q8H  Lidocaine (XYLOCAINE) 10 mg/mL (1%) Injection 20 mL, INFILTRATION, AT BEDSIDE  Polyethylene Glycol 3350 (MIRALAX) Oral Powder Packet 17 g, ORAL, QAM  Sennosides (SENOKOT) Tablet 17.2 mg, ORAL, Daily Bedtime    IV Medications  DOPamine, 2-20 mcg/kg/min, IV, CONTINUOUS, Last Rate: 20 mcg/kg/min (04/07/13 9811)  Hydromorphone, , IV, PCA  NaCl 0.9% 1,000 mL with Potassium Chloride 20 mEq, , IV, CONTINUOUS, Last Rate: 20 mL/hr at 04/07/13 0600    PRN Medications  Bisacodyl (DULCOLAX) Suppository 10 mg, RECTALLY, Q24H PRN  Calcium Carbonate (OS-CAL) Tablet 2,500-5,000 mg, ORAL, PRN  Calcium Gluconate 3,000 mg in 0.9% NaCl 100 mL IVPB 3,000 mg, IV, PRN  Diazepam (VALIUM) Injection 2.5-5 mg, IV, Q4H PRN  Hydrocodone 5 mg/Acetaminophen 325 mg (NORCO  5) Tablet 1-2 tablet, ORAL, Q4H PRN  Hydromorphone (DILAUDID) Injection 1-2 mg, IV, Q2H PRN  Magnesium Hydroxide (MILK OF MAGNESIA) 400 mg/5 mL Suspension 30 mL, ORAL, Q12H PRN  Magnesium Oxide (MAG-OX 400) Tablet 800-1,600 mg, ORAL, PRN  Magnesium Sulfate 2  grams in 50 mL IVPB, IV, PRN  Naloxone (NARCAN) Injection 0.1 mg, IV, PRN  Ondansetron (ZOFRAN) Injection 4 mg, IV, Q8H PRN  Potassium Chloride 10 % Liquid 20-60 mEq, ORAL, PRN      ALLERGIES:   Allergies   Allergen Reactions    Nsaids (Non-Steroidal Anti-Inflammatory Drug) Swelling       VITAL SIGNS  Summary  Temp Min: 36.2 C (97.2 F) Max: 36.7 C (98.1 F)  BP: (91-148)/(57-122)   Pulse Min: 72 Max: 106  Resp Min: 7 Max: 20  SpO2 Min: 93 % Max: 100 %      Current Vitals  Temp: 36.7 C (98.1 F)  BP: 124/63 mmHg  Pulse: 72  Resp: 10  SpO2: 95 %      Weight: 65 kg (143 lb 4.8 oz)         Intake/Output Summary (Last 24 hours) at 04/07/13 0702  Last data filed at 04/07/13 0600   Gross per 24 hour   Intake 1696.71 ml   Output   3820 ml   Net -2123.29 ml       ABG   No results found for this basename: ARTPH:*,ARTPCO2:*,ARTPO2:*,ARTHCO3:*,ARTO2SAT:*,ARTBE:* in the last 12 hours    PHYSICAL EXAM  GENERAL: NAD. Sitting up in bed, drinking water. A/o x 3.  HEENT: NC/AT. PERRL. EOMI. Anicteric. MMM. OP  Clear. Neck Supple.   LYMPH: no cervical or supraclavicular LAD  CHEST: CTAB, no w/r/r  HEART: RRR, no murmurs  ABDOMEN: BS+, Soft, NT/ND. No HSM.   EXT: WWP. No e/c/c  NEURO: CN2-12 grossly intact. Moves all four extremities.   PSYCH: normal affect  SKIN: no rashes, multiple tattoos.     CBC Recent labs for the past 48 hours     04/06/13 2330 04/06/13 0635    WHITE BLOOD CELL COUNT 11.8* 11.9*    HEMOGLOBIN 13.8* 13.4*    HEMATOCRIT 39.6* 38.5*    PLATELET COUNT 235 237      CHEM 20 Recent labs for the past 48 hours     04/06/13 2330 04/06/13 0635    SODIUM 133* 136    POTASSIUM 4.0 3.8    CHLORIDE 99 105    CARBON DIOXIDE TOTAL 27 24    CREATININE BLOOD 0.55 0.56    UREA NITROGEN, BLOOD (BUN) 9 8    GLUCOSE 138* 134*    CALCIUM 8.9 8.7    PROTEIN -- --    ALBUMIN -- --    ALKALINE PHOSPHATASE (ALP) -- --    ASPARTATE TRANSAMINASE (AST) -- --    BILIRUBIN TOTAL -- --    ALANINE  TRANSFERASE (ALT) -- --    TRIGLYCERIDE -- --    URIC ACID, BLOOD -- --    LACTATE DEHYDROGENASE -- --    PHOSPHORUS (PO4) -- --    CHOLESTEROL -- --    MAGNESIUM (MG) -- --      Results for Carl, Mata (MRN [id number]) as of 04/06/2013 07:04   Ref. Range 04/05/2013    PARATHYROID HORMONE INTACT Latest Range: 12-88 pg/mL 78   VITAMIN D, 25 HYDROXY Latest Range: 30.0-100.0 ng/mL 26.5 (L)       IMAGING:  I have personally reviewed the following images with Dr. Melynda Keller  CXR with PICC line in RA. No e/o infiltrate, effusion, or pneumothorax. Spinal hardware in place.     IMPRESSION / RECS:  37 yo M w/ hx of fall resulting in T12 compression fracture that progressed leading to symptomatic cord compression necessitating emergent T10-L2 posterior fusion, T12 laminectomy on 04/04/2013.      # T12 cord compression: In order to attain increased perfusion to the spinal cord dopamine is running for a goal MAP of 85 for the next week. PTH normal at 78. Vitamin D low at 26.5  - neurosurgical service plans to give dopamine for a 5 day course (start date 10/27)  - agree with PCA for pain as well as bowel regimen  - anticipate conversion to orals in near future.  - starting vitamin D 50,000 international unit(s) once weekly x 8 weeks and then re-checking vitamin D levels. (1st dose 04/07/2013)      # history of tobacco abuse: currently no respiratory symptoms. SpO2 > 93% on RA. No wheezing on exam.   - robinul is not indicated at this time  - should patient develop wheezing would recommend combivent  - recommend ongoing utilization of incentive spirometer      VAP Prophylaxis: not intubated  DVT Prophylaxis: SQ heparin  GI Prophylaxis: Not indicated  LDA: PIV x 3, L radial A line (10/29), L & R thoracic spine drain (10/27), R PICC (10/28), foley (10/30)  Nutrition: regular diet    Pt seen & discussed with Dr. Melynda Keller  We will continue to follow. Feel free to call if any acute issues or  questions.  Electronically signed by:   Geri Seminole  PCCM Fellow  Pager: [phone]  On-call Pager: [phone] (weekdays 7 PM - 7 AM & weekends)     This patient was seen, evaluated, and care plan was developed with the resident.  I agree with the assessment and plan as outlined in the resident's note.  Report electronically signed by Vernon Prey, MD. Attending

## 2013-04-07 NOTE — Nurse Focus (Signed)
Pt upset being in the ICU stating that he has " been poked a lot". Emotional support provided. Per pt he doesn't like the lines and that he will just take them all off. Artline is very positional at this time. Pt is alert and oriented, educated about the complications. Pt calmed down and agreed to not taking them off. Md aware. Vital signs stable. Will continue to monitor. Ellaine Jerney Baksh RN

## 2013-04-07 NOTE — Plan of Care (Signed)
Problem: Pain, Acute (Adult, Obstetrics)  Goal: Acceptable Pain Control/Comfort Level (Acute Pain)  Patient will demonstrate the desired outcomes.   Outcome: Other - note required  Pt continues to be on PCA dilaudid w/ prn pain meds. Pt educated how to use PCA. Verbalized understanding

## 2013-04-07 NOTE — Nurse Assessment (Signed)
ASSESSMENT NOTE    Note Started: 04/07/2013, 15:49     Initial assessment completed.  Report received from day shift nurse and orders reviewed. Plan of Care reviewed and appropriate, discussed with patient.  Gypsy Lore, RN RN

## 2013-04-07 NOTE — Consults (Addendum)
PM&R Inpatient Consultation Note    Date of service: 04/07/2013    Reason for consultation: Candidacy for acute inpatient rehabilitation  Requesting service: Neurosurgery  Requesting attending: Fernande Bras, MD     HISTORY OF PRESENT ILLNESS: 37yr old male w/ hx of fall from a ladder at the end of June from ~13ft when he was fixing an [facility] unit, and had landed on his buttocks, resulting in a T12 compression fracture. During his admission he was managed conservatively with a TLSO back brace which was to be worn while sitting upright/standing for 4-6 week. He returned to the hospital yesterday in the setting of lower back pain, saddle anesthesia, intermittent urinary incontinence, weakness and hyperesthesias. MRI re-demonstrated the burst fracture of T12 vertebral body with ~50% loss of anterior body height, which was worse than his first imaging series from when he had presented at the end of june. He is now s/p T10-L2 posterior fusion, T12 laminectomy on 04/04/2013. In order to attain increased perfusion to the spinal cord dopamine has been running for a goal MAP of 85.     Hospital Course complicated by:  -  n/a    Services Consulted during Hospitalization:  - NSG, critical care    Primary Care Doctor/Specialists:  - No Pcp No Pcp, [email or web address] @None     REVIEW OF SYSTEMS:   Notable for the following: Leading up to the hospitalization, he was having HA, dizziness, saddle anesthesia, BLE thigh numbness with paresthesias radiating distally, bladder incontinence, gait impairment (dragging feet). Much of this has resolved, although he still feels urinary urgency when he rolls over in bed. His pain is in his low back.  Otherwise, complete ROS negative other than as per HPI.    Baseline Functional Status:  Independent in mobility without assistive device.   Independent with ADL's without assistive device.    Current Functional Status:   Date  04/07/2013     Communication Verbal   Bladder management Foley just removed   Last  bowel movement NR   Nutrition Regular texture   Bed mobility supervised   Transfers: Min assist    ADL's Max assist LBD. Independent upper body grooming and self feeding (likely pain-limited and expected to improve)   Gait NT   Cognition/Memory Not formally tested by ST      SOCIAL HISTORY:   History     Social History    Marital Status: SINGLE     Spouse Name: N/A     Number of Children: N/A    Years of Education: N/A     Social History Main Topics    Smoking status: Current Every Day Smoker -- 0.50 packs/day for 20 years     Types: Cigarettes    Smokeless tobacco: Not on file    Alcohol Use: No    Drug Use: Yes     Special: Methamphetamine    Sexual Activity: Not on file     Other Topics Concern    Not on file     Social History Narrative    No narrative on file        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 Reportedly on parole in [place]. Used to work as a Designer, fashion/clothing.   Lives in [place], currently "on the street," has a girlfriend but does not live with her. Doesn't know where he will live next. Says his GF's worker is looking into this.   H/o alcohol dependence (reports being  sober for 1 year), + tobacco, + drug use (meth; reports last use >1 year ago).    Discharge Disposition: No one available for 24/7 supervision and physical assistance.    Funding: Payor: [employer] / Plan: [employer] / Product Type: *No Product type* /      PAST MEDICAL HISTORY:   Past Medical History   Diagnosis Date    Unspecified mental or behavioral problem      bipolar    Unspecified asthma(493.90)        Reviewed in EMR and unchanged.    PAST SURGICAL HISTORY:   Past Surgical History   Procedure Laterality Date    Fusion thoracic posterior N/A 04/04/2013     Procedure: FUSION THORACIC POSTERIOR;  Surgeon: Fernande Bras, MD;  [facility];  Service: Neurosurgery    Laminectomy thoracic posterior N/A 04/04/2013     Procedure: LAMINECTOMY THORACIC POSTERIOR;  Surgeon:  Fernande Bras, MD;  [facility];  Service: Neurosurgery      Reviewed in EMR and unchanged.    FAMILY HISTORY:   Family History   Problem Relation Age of Onset    No FH spine problems [Other] [OTHER]      updated    ALLERGIES:   Nsaids (Non-Steroidal Anti-Inflammatory Drug)    Swelling    HOME MEDICATIONS:   No current facility-administered medications on file prior to encounter.     Current Outpatient Prescriptions on File Prior to Encounter   Medication Sig Dispense Refill    ALBUTEROL INHA         Cyclobenzaprine (FLEXERIL) 10 mg Tablet Take 1 tablet by mouth every 8 hours.  90 tablet  0    Docusate (COLACE) 100 mg Capsule Take 1 capsule by mouth 2 times daily.  60 capsule  2       CURRENT MEDICATIONS:   SCHEDULED MEDICATIONS:  Docusate (COLACE) Capsule 100 mg, ORAL, BID  Ergocalciferol (Vitamin D2) (VITAMIN D2) Capsule 50,000 Units, ORAL, Q7D Now  Heparin PF 5,000 units/0.5 mL Injection 5,000 Units, SUBCUTANEOUS, Q8H  Lidocaine (XYLOCAINE) 10 mg/mL (1%) Injection 20 mL, INFILTRATION, AT BEDSIDE  Polyethylene Glycol 3350 (MIRALAX) Oral Powder Packet 17 g, ORAL, QAM  Sennosides (SENOKOT) Tablet 17.2 mg, ORAL, Daily Bedtime        PRN MEDICATIONS:  Bisacodyl (DULCOLAX) Suppository 10 mg, RECTALLY, Q24H PRN  Calcium Carbonate (OS-CAL) Tablet 2,500-5,000 mg, ORAL, PRN  Calcium Gluconate 3,000 mg in 0.9% NaCl 100 mL IVPB 3,000 mg, IV, PRN  Diazepam (VALIUM) Injection 2.5-5 mg, IV, Q4H PRN  Hydrocodone 5 mg/Acetaminophen 325 mg (NORCO  5) Tablet 1-2 tablet, ORAL, Q4H PRN  Hydromorphone (DILAUDID) Injection 1-2 mg, IV, Q2H PRN  Magnesium Hydroxide (MILK OF MAGNESIA) 400 mg/5 mL Suspension 30 mL, ORAL, Q12H PRN  Magnesium Oxide (MAG-OX 400) Tablet 800-1,600 mg, ORAL, PRN  Magnesium Sulfate 2 grams in 50 mL IVPB, IV, PRN  Midodrine (PROAMATINE) Tablet 5 mg, ORAL, Q8H PRN  Naloxone (NARCAN) Injection 0.1 mg, IV, PRN  Ondansetron (ZOFRAN) Injection 4 mg, IV, Q8H PRN  Potassium Chloride 10 % Liquid 20-60 mEq, ORAL,  PRN        IV MEDICATIONS:  DOPamine, 2-20 mcg/kg/min, IV, CONTINUOUS, Last Rate: 11 mcg/kg/min (04/07/13 1030)  Hydromorphone, , IV, PCA  NaCl 0.9% 1,000 mL with Potassium Chloride 20 mEq, , IV, CONTINUOUS, Last Rate: 20 mL/hr at 04/07/13 1010        PHYSICAL EXAMINATION:  BP 120/73  Pulse 98  Temp(Src) 36.1 C (  97 F) (Tympanic)  Resp 7  Ht 1.727 m (5' 7.99")  Wt 65 kg (143 lb 4.8 oz)  BMI 21.79 kg/m2  SpO2 95%    Gen: NAD, repeatedly falling asleep during interview  Psych: Normal affect  HEENT: neck supple, MMM  CV: Extremities warm and well perfused  Pulm: Respirations nonlabored  Abdomen: Soft, nondistended  Extremities: no LE edema  Skin: No rashes noted on visible skin    Musculoskeletal:   Inspection: Symmetric muscle bulk without focal atrophy   ROM:  No focal contractures   Palpation:  Warm, well-perfused extremities without edema.     Neurological:   Manual muscle testing:   SAb EF EE WE FDP FAb  Other    R  5/5 5/5 5/5 5/5 5/5 5/5    L  5/5 5/5 5/5 5/5 5/5 5/5       HF KE  ADF EHL  APF  Other   R  5-/5 5/5 5/5 5/5 5/5    L  5-/5 5/5 5/5 5/5 5/5      Sensation: Intact to LT and PP in all ASIA dermatomes    Anorectal: Intact to LT and PP in S3-5 bilaterally. Deep anal pressure intact. Voluntary anal contraction strong.    Proprioception: Great toe proprioception intact to medium and large movements. Pt did not respond to small movements, but sleepiness impedes clear assessment.  Coordination: FTN slow but accurate    Reflexes/Tone:    Muscle stretch reflexes: Diffusely brisk in upper and lower extremities. Toes equivocal. No clonus. Hoffman equivocal.   Tone MAS score - wnl.    Cranial nerve exam: Intact CN 2-12 bilaterally   Cognition:  Grossly wnl except level of sedation, likely from opioids    DIAGNOSTIC STUDIES:    Labs:  Reviewed, see EMR for actual results.   BASIC METABOLIC PANEL   Recent labs for the past 72 hours     04/06/13 2330 04/06/13 0635 04/04/13 1513 04/04/13 1121    GLUCOSE  138* 134* 82 101*    UREA NITROGEN, BLOOD (BUN) 9 8 7* 7*    CREATININE BLOOD 0.55 0.56 0.72 0.70    SODIUM 133* 136 137 136    POTASSIUM 4.0 3.8 4.4 4.5    CHLORIDE 99 105 103 103    CARBON DIOXIDE TOTAL 27 24 28 27     CALCIUM 8.9 8.7 9.6 9.5      CBC   Recent labs for the past 72 hours     04/06/13 2330 04/06/13 0635 04/04/13 1513 04/04/13 1121    WHITE BLOOD CELL COUNT 11.8* 11.9* 6.1 6.2    HEMOGLOBIN 13.8* 13.4* 15.4 15.0    HEMATOCRIT 39.6* 38.5* 44.5 43.4    PLATELET COUNT 235 237 244 241          Imaging/Additional Studies:   Pertinent imaging results were reviewed and visualized as below and as described in the HPI.   10/27 CT L spine:  1. T12-L1 decompressive laminectomy with posterior fusion hardware  placement from T10-L2. Overall the hardware appears appropriately  positioned and intact.  2. T12 burst fracture, unchanged in appearance.    MRI L spine 10/27:  UNCHANGED APPEARANCE OF T12 BURST FRACTURE INVOLVING ALL 3 COLUMNS. NO  CHANGE IN VERTEBRAL BODY HEIGHT OR ALIGNMENT.   RETROPULSED BONE WITH IMPRESSION ON THE THORACIC CORD IS UNCHANGED AS WELL.  NO NEW ABNORMALITY.    MRI T/L spine 7/17:  UNCHANGED APPEARANCE OF T12 BURST FRACTURE INVOLVING ALL 3  COLUMNS. NO  CHANGE IN VERTEBRAL BODY HEIGHT OR ALIGNMENT.   RETROPULSED BONE WITH IMPRESSION ON THE THORACIC CORD IS UNCHANGED AS WELL.  NO NEW ABNORMALITY.    IMPRESSION: Carl Mata is a 37yr old homeless male with h/o EtOH and meth abuse, currently reports as sober, on parole, with old T12 burst fracture with symptomatic cord/conus compression without clear cord/conus signal abnormality, with significant low back pain, gait impairment, saddle anesthesia, BLE numbness/paresthesias, and bladder incontinence. This is consistent with conus medullaris syndrome. Fortunately there is no current evidence of neurologic deficit on physical exam postoperatively and his sensory symptoms have resolved.    The patient has neuromusculoskeletal  deficits as a result of these above diagnoses including functional BLE weakness, possible sensory impairment, probable impaired proprioception in BLE, neurogenic urinary incontinence, possible neurogenic bowel.  These deficits have resulted in disability in mobility and activities of daily living.      Medical comorbidities:  H/o substance abuse  Pain management; agree with goal of weaning PCA to PO meds.  Vitamin D deficiency  Completing MAP therapy today for cord perfusion    Present on Admission: PRESENT ON ADMISSION:  Are any of the following five conditions present or suspected on admission: decubitus ulcer, infection from an intravascular device, infection due to an indwelling catheter, surgical site infection or pneumonia? Urinary sense of urgency raises possible concern for UTI. Skin: await full nursing evaluation.      RECOMMENDATIONS:     Rehab: Given his lack of neurologic deficit on physical exam, Mr Feldhaus is expected to improve relatively quickly as his post-op pain resolves and likely will be too high level for acute inpatient rehab. Although he reports as currently sober, his history of substance abuse may make pain control a little more difficult. Recommend SNF/subacute level rehabilitation until pt is safe at independent level given his homeless status. Recommend continued DCP involvement.    Staffed with attending Dr. Ellen Henri.    Verna Czech  Physical Medicine and Rehabilitation resident, [facility]er  Pager [phone]    PM&R Staff Note    This patient was seen, examined, and care plan was developed with the resident. I agree with the assessment and plan as outlined in the resident's note with edits as above.    Given substance abuse history and current over-use of PCA to the point of intoxication based on my exam, I would recommend discontinuing IV opioids as they are too rewarding for this patient and would transition to oral agents only.  Anticipate patient will clear for discharge relatively quickly  given his essentially normal neuro exam at this time.      Urinary retention - currently has foley that was replaced last night, with some difficulty per bedside nurse.  Given normal rectal exam, it is less likely that he has neurogenic bladder from spinal cord injury, but this is possible.  Ideal management would be intermittent catheterization rather than foley, so would recommend foley removal with voiding trial and intermittent catheterization if retaining more than 250 mL every 4 hours.    Electronically signed by:  Perrin Maltese, M.D.  [phone]  Pager [phone]  PM&R Attending Physician

## 2013-04-07 NOTE — Nurse Focus (Signed)
04/07/2013  10:35  Weaning dopamine per order. Currently at 26mcg/kg/min. Pt VSS.  Frankey Shown, RN

## 2013-04-08 MED ORDER — HYDROMORPHONE 1 MG/ML INJECTION SYRINGE
1.0000 mg | INJECTION | Freq: Once | INTRAMUSCULAR | Status: AC
Start: 2013-04-08 — End: 2013-04-08
  Administered 2013-04-08: 1 mg via INTRAVENOUS
  Filled 2013-04-08: qty 1

## 2013-04-08 MED ORDER — BACLOFEN 10 MG TABLET
10.0000 mg | ORAL_TABLET | Freq: Three times a day (TID) | ORAL | Status: DC
Start: 2013-04-08 — End: 2013-04-09
  Administered 2013-04-08 – 2013-04-09 (×4): 10 mg via ORAL
  Filled 2013-04-08 (×4): qty 1

## 2013-04-08 MED ORDER — HYDROMORPHONE 1 MG/ML INJECTION SYRINGE
1.0000 mg | INJECTION | INTRAMUSCULAR | Status: DC | PRN
Start: 2013-04-08 — End: 2013-04-09
  Administered 2013-04-08 – 2013-04-09 (×5): 1.5 mg via INTRAVENOUS
  Filled 2013-04-08 (×5): qty 2

## 2013-04-08 MED ORDER — HYDROCODONE 10 MG-ACETAMINOPHEN 325 MG TABLET
1.0000 | ORAL_TABLET | ORAL | Status: DC | PRN
Start: 2013-04-08 — End: 2013-04-09
  Administered 2013-04-08 – 2013-04-09 (×6): 2 via ORAL
  Filled 2013-04-08 (×6): qty 2

## 2013-04-08 MED ORDER — DIAZEPAM 5 MG TABLET
5.0000 mg | ORAL_TABLET | Freq: Three times a day (TID) | ORAL | Status: DC | PRN
Start: 2013-04-08 — End: 2013-04-09
  Administered 2013-04-08 – 2013-04-09 (×4): 10 mg via ORAL
  Filled 2013-04-08 (×4): qty 2

## 2013-04-08 NOTE — Progress Notes (Addendum)
DEPARTMENT OF NEUROLOGICAL SURGERY   MINOR BEDSIDE PROCEDURE NOTE    Date of service: 04/08/2013             Pre-Procedure Diagnosis: S/p T10-L2 posterior fusion, T12 laminectomy    Post-Procedure Diagnosis: same  Indication: Drain removal, no longer indicated  Anesthesia: none    Procedure Performed / Description: Drain Removal  The patient is POD: # 4. Drain output over the last 24 hours is 90 ml. It is no longer indicated.   Drainage description: Serosanguineous fluid  Wound description: drain site clear, no bleeding at exit site  Dressing over wound: 2x2 gauze with transparent dressing.  Complications: None. Patient tolerated well.  Outcome: Uneventful removal of hemovac drain that was no longer indicated.      Nida Boatman, FNP-BC  Nurse Practitioner  Department of Neurological Surgery   Pager: [phone]

## 2013-04-08 NOTE — Nurse Assessment (Signed)
ASSESSMENT NOTE    Note Started: 04/08/2013, 22:54     Initial assessment completed and recorded in EMR.  Report received from day shift nurse and orders reviewed. Plan of Care reviewed and updated, discussed with patient.        POD: #4 S/P: T10-L2 posterior fusion, T12 laminectomy - with dressing on back dry and intact.    Pt assessed - alert and oriented x 4, speech clear, PERRLA, grossly tracks, FS, TM. Denies blurry or double vision. Moves all ext x 4, denies numbness or tingling. Voiding spont to clear yellow urine thru urinal. OOB with assistance with FWW and  TLSO brace. Med per [facility] for pain. Assisted in needs. Maintained on safety and fall prec. Will cont to monitor.      Anson Fret, RN

## 2013-04-08 NOTE — Allied Health Progress (Signed)
Physical Therapy Progress Note     Date of Service: 04/08/2013 2  Time in: 9.20am  Total time: 30 Minutes    S: 5/10 pain located in back, nurse admin pain meds prior to PT. Agreeable OOB    O:    Phase I    Phase II        Command and physical response activation    Arousal/ orientation/ communication  Degree of Interaction: Low Cooperation     Command and verbal response activation    Patient and/or caregiver education       Arousal and orientation     degree of Interaction: Comatose, Unarousable              Musculoskeletal Program:  Positioning Head to Feet: prevent subluxation, joint malalignment, manage tone, manage edema, visual-spatial orientation, PROM all limbs: proximal to distal, facilitate basic AROM         Musculoskeletal Program:  Advancement (AROM, AAROM, resistive training, metered exercise UE/ LE)      Participation in beginning components of bed mobility:  Reaching, rolling, active LE    Positioning Head to Feet: prevent subluxation, joint malalignment, manage tone, manage edema, visual-spatial orientation    Sensorimotor Program: midline orientation, reflexes, tactile feedback  Sensorimotor Program: visual attention, midline orientation, righting reactions     Caregiver education and participation as appropriate    Supported sitting EOB, cardiac chair, wheelchair      Dependent splint/ orthotic application   Mobilize out of bed:  Passive Transfers Dependent through Max Assist ?Lift Team indicated      Dependent mobilization out of bed (to cardiac chair)   Mobilize out of bed:  Assess transfer type, level of assist, tolerance          Sitting schedule implemented all shifts          EOB:  Supported, unsupported or challenged balance activities           Supported sitting exercise:  metered exercise UE / LE       Pre-gait training (dependent to moderate assist)      ADL advancement: hand-over-hand, modeling           Other mobilization:  Tilt Table Program        Passive Splint/Orthotic  Application     Phase III    Phase IV      Arousal /orientation/ communication  Degree of Interaction: Moderate Cooperation    Degree of InteractionChartered certified accountant for patient/ family     Patient and/or caregiver education as appropriate (incorporate any appropriate activity)   x   Advanced Musculoskeletal Program:  Resistive, metered exercise UE/LE       Musculoskeletal Program: Advancement (AROM, AAROM, resistive training, metered exercise  UE/ LE, Object Manipulation)     x Advanced bed mobility / incorporate nursing all shifts    Sensorimotor Program: proprioception feedback, coordination reactions x Sensorimotor Program: timing, skilled voluntary control of limbs, position / direction change reactions     Caregiver education and participation   x Functional transfer training (commode or chair)/ incorporate nursing all shifts      Bed mobility advancement   x High level balance activities      Sitting balance advancement:  Static and dynamic trunk activities,   x Gait program or (wheelchair mobility for wheelchair-dependent only), incorporate nursing all shifts      Advance out of bed sitting tolerance    x Facilitation of typical  ADL  and self-help skill performance     Active assisted transfer training and advancement   x Active splint/ orthotic application     Standing Activities (Pre-gait):  Supported/ unsupported, Static/ dynamic, tilt table         Gait training/ assisted gait          Active participation in ADL advancement:  wheelchair level, chair,  vanity, toilet        Active assistive splint/ orthotic application         Nurse cleared PT to see pt.  Reviewed spine precautions  [employer] TLSO with cues and min A  Sit to stand with FWWSBA, ambulates with FWW, PT assist with IV pole 200 feet, cues for wider base gait  Attempted to instruct pt to use walker to go up and down 1 step, pt adamently declined, say he had done it before, has no need for it.  Reviewed LE ROM ex in sitting, up in  chair, call light in reach, transition care to nurse    Patient is in phase 4.    A: if pt were to be DC over the w/e, will need FWW, pt wanted his TLSO to be loose, not receptive to recommendations    P: continue high level balance activities while pt still in hospital, could DC on FWW level    [facility]   Physical Therapsit PI# [id number]  Dept. Of Physical Medicine & Rehabilitation / Acute Care Services  Pager Number [phone]  Vocera  [phone]

## 2013-04-08 NOTE — Allied Health Procedure (Signed)
Phase Summary - Mobility Pilot    Mobility Guidelines    Last Documented Mobility Phase: Phase 4    Current Phase: Phase 4    Mobility Session Initiated?:Yes    Mobility Session Completed? Yes      Broderic Bara   Physical Therapsit PI# [id number]  Dept. Of Physical Medicine & Rehabilitation / Acute Care Services  Pager Number [phone]  Vocera  [phone]

## 2013-04-08 NOTE — Allied Health Progress (Signed)
Orthotics/Prosthetics Outpatient Progress Note    Progress Note: Patient Carl Mata, Carl Mata was seen in unit E5 in bed 39-1 for an abdominal binder.  Patient fit with a 10" large abdominal binder.  Patient shown how to properly don and doff the binder, as well as instructions on cleaning and maintenance.      Dx:   blood pressure support    Education:     Handout: yes  Verbal Instruction: yes    Referring Physician: Joretta Bachelor [[id number]]      Kae Heller, [facility]   Cert #: [id number]      Dept. Of PM&R: Orthotics/ Prosthetics  [phone] (phone)  [phone] (beeper)

## 2013-04-08 NOTE — Progress Notes (Addendum)
DEPARTMENT OF NEUROLOGICAL SURGERY  WARD PROGRESS NOTE          Date of service: 04/08/2013    POD: #4        S/P: T10-L2 posterior fusion, T12 laminectomy     INTERVAL SUMMARY:  - pain 7/10. Requiring Norco and Dilaudid for pain relieved. Pain pharm consult ordered  - Neuro exam stable  - HV d/cd  - PT/OT  - OOB with brace  - voiding spontanously  - PMR consulted, recs pending      Medications  Docusate (COLACE) Capsule 100 mg, ORAL, BID  Ergocalciferol (Vitamin D2) (VITAMIN D2) Capsule 50,000 Units, ORAL, Q7D Now  Heparin PF 5,000 units/0.5 mL Injection 5,000 Units, SUBCUTANEOUS, Q8H  Lidocaine (UROJET) 2% Urethral Injection 1 syringe, URETHRAL, ONCE  Lidocaine (XYLOCAINE) 10 mg/mL (1%) Injection 20 mL, INFILTRATION, AT BEDSIDE  Polyethylene Glycol 3350 (MIRALAX) Oral Powder Packet 17 g, ORAL, QAM  Sennosides (SENOKOT) Tablet 17.2 mg, ORAL, Daily Bedtime      IV  NaCl 0.9% 1,000 mL with Potassium Chloride 20 mEq, , IV, CONTINUOUS, Last Rate: 20 mL/hr at 04/07/13 2000        PRNs  Bisacodyl (DULCOLAX) Suppository 10 mg, RECTALLY, Q24H PRN  Calcium Carbonate (OS-CAL) Tablet 2,500-5,000 mg, ORAL, PRN  Calcium Gluconate 3,000 mg in 0.9% NaCl 100 mL IVPB 3,000 mg, IV, PRN  Diazepam (VALIUM) Injection 2.5-5 mg, IV, Q4H PRN  Hydrocodone 5 mg/Acetaminophen 325 mg (NORCO  5) Tablet 1-2 tablet, ORAL, Q4H PRN  Hydromorphone (DILAUDID) Injection 1-2 mg, IV, Q4H PRN  Magnesium Hydroxide (MILK OF MAGNESIA) 400 mg/5 mL Suspension 30 mL, ORAL, Q12H PRN  Magnesium Oxide (MAG-OX 400) Tablet 800-1,600 mg, ORAL, PRN  Magnesium Sulfate 2 grams in 50 mL IVPB, IV, PRN  Midodrine (PROAMATINE) Tablet 7.5 mg, ORAL, Q8H PRN  NaCl 0.9% Bolus 500 mL, IV, PRN  Naloxone (NARCAN) Injection 0.1 mg, IV, PRN  Ondansetron (ZOFRAN) Injection 4 mg, IV, Q8H PRN  Potassium Chloride 10 % Liquid 20-60 mEq, ORAL, PRN      Current Vital Signs  Temp: 36.8 C (98.2 F) (10/31 0740)  Temp src: Oral (10/31 0740)  Pulse: 100 (10/31 0740)  BP: 106/70 mmHg (10/31  0740)  Resp: 16 (10/31 0740)  SpO2: 98 % (10/31 0510)  Height: 172.7 cm (5' 7.99") (10/29 0600)  Weight: 65 kg (143 lb 4.8 oz) (10/29 0600)    24 Hour Vital Signs    Temp src:  [-]   Temp:  [36.7 C (98.1 F)-37.1 C (98.8 F)]   Pulse:  [81-129]   BP: (94-123)/(48-93)   Resp:  [5-18]   SpO2:  [90 %-99 %]     I/O Last 2 Completed Shifts:  In: 668 [Oral:370; Crystalloid:298]  Out: 1590 [Urine:1500; Other:90]    PHYSICAL EXAM:  General Appearance:awake, alert, NAD  Neuro Exam:   GCS: Eyes:4 Verbal:5 Motor:6  Pupils: OU: 3 mm, equal, briskly reactive to light  No pronator drift  Cranial Nerves: 2-12 grossly intact    Motor:    Deltoid  Biceps  Triceps  Wrist Flexion  Wrist Extension  Hand Intrinsics    Right  5 5 5 5 5 5    Left  5 5 5 5 5 5       Hip Flexion  Knee Extension  Knee Flexion  Dorsiflexion  Great Toe Extension  Plantar Flexion    Right  4+ 4+ 4+ 5 5 5    Left  4+ 4+ 4+ 5 5 5  Sensory: LT intact in all extremities        INCISIONS: CDI with staples. Incision erythematous, no drainage  Wound Drain(s): HV x1 dc/d     Labs:  Latest Chem--7 with Mg  Recent labs for the past 48 hours     04/08/13 0525    SODIUM 135    POTASSIUM 4.0    CHLORIDE 101    CARBON DIOXIDE TOTAL 27    UREA NITROGEN, BLOOD (BUN) 13    CREATININE BLOOD 0.60    GLUCOSE 90    MAGNESIUM (MG) --     Latest CBC  Recent labs for the past 48 hours     04/08/13 0525    WHITE BLOOD CELL COUNT 8.5    HEMOGLOBIN 12.8*    HEMATOCRIT 37.5*    PLATELET COUNT 232     Latest INR / APTT  Recent labs for the past 48 hours     04/08/13 0525    INR 0.98    APTT 32.1     ABG Last 24 hours   No results found for this basename: ARTPO2:*,ARTO2SAT:*,ARTPCO2:*,ARTPH:*,ARTHCO3:*,ARTBE:* in the last 24 hours    Cultures: None    PLAN:   Neuro:  ) T12 burst fracture, kyphotic deformity S/P T12-L2 posterior fusion  - Pain control. Pain Pharm consult pending  - Continue Neuro exam Q4h  - TLSO brace when OOB  - Postop CT with good correction and adequate  instrumentation and decompression at T12.  - PT/OT    CV: HDS.  Pulm: Stable on RA  ZO:XWRUEAV diet. Routine bowel care.  GU: Voiding spontaneously  FEN: NS at 125cc/hr, replete electrolytes PRN. SL when PO's good  DVT: SCDs. SQH  ID: Afebrile, no leukoyctosis  Lines: PICC 10/28  Dispo: Home when appropriate        Nida Boatman, FNP-BC  Nurse Practitioner  Department of Neurological Surgery   Pager: [phone]     I agree with the assessment and plan as outlined in the note.  Report electronically signed by Fernande Bras, MD. Attending

## 2013-04-08 NOTE — Progress Notes (Addendum)
DEPARTMENT OF NEUROLOGICAL SURGERY  WARD PROGRESS NOTE          Date of service: 04/09/2013    POD: #5        S/P: T10-L2 posterior fusion, T12 laminectomy     INTERVAL SUMMARY:  - Pain pharm recommendations implemented.   - PT ( phase IV)  /OT (phase IV)   - OOB with brace  - Possibly home tomorrow or Monday pending PT and OT clearance and good po pain control.       Medications  Baclofen (LIORESAL) Tablet 10 mg, ORAL, TID  Docusate (COLACE) Capsule 100 mg, ORAL, BID  Ergocalciferol (Vitamin D2) (VITAMIN D2) Capsule 50,000 Units, ORAL, Q7D Now  Heparin PF 5,000 units/0.5 mL Injection 5,000 Units, SUBCUTANEOUS, Q8H  Lidocaine (UROJET) 2% Urethral Injection 1 syringe, URETHRAL, ONCE  Lidocaine (XYLOCAINE) 10 mg/mL (1%) Injection 20 mL, INFILTRATION, AT BEDSIDE  Polyethylene Glycol 3350 (MIRALAX) Oral Powder Packet 17 g, ORAL, QAM  Sennosides (SENOKOT) Tablet 17.2 mg, ORAL, Daily Bedtime      IV  NaCl 0.9% 1,000 mL with Potassium Chloride 20 mEq, , IV, CONTINUOUS, Last Rate: Stopped (04/08/13 1415)        PRNs  Bisacodyl (DULCOLAX) Suppository 10 mg, RECTALLY, Q24H PRN  Calcium Carbonate (OS-CAL) Tablet 2,500-5,000 mg, ORAL, PRN  Calcium Gluconate 3,000 mg in 0.9% NaCl 100 mL IVPB 3,000 mg, IV, PRN  Diazepam (VALIUM) Tablet 5-10 mg, ORAL, Q8H PRN  Hydrocodone 10 mg/Acetaminophen 325 mg (NORCO 10) Tablet 1-2 tablet, ORAL, Q4H PRN  Hydromorphone (DILAUDID) Injection 1-1.5 mg, IV, Q4H PRN  Magnesium Hydroxide (MILK OF MAGNESIA) 400 mg/5 mL Suspension 30 mL, ORAL, Q12H PRN  Magnesium Oxide (MAG-OX 400) Tablet 800-1,600 mg, ORAL, PRN  Magnesium Sulfate 2 grams in 50 mL IVPB, IV, PRN  Midodrine (PROAMATINE) Tablet 7.5 mg, ORAL, Q8H PRN  NaCl 0.9% Bolus 500 mL, IV, PRN  Naloxone (NARCAN) Injection 0.1 mg, IV, PRN  Ondansetron (ZOFRAN) Injection 4 mg, IV, Q8H PRN  Potassium Chloride 10 % Liquid 20-60 mEq, ORAL, PRN      Current Vital Signs  Temp: 36.6 C (97.9 F) (11/01 0420)  Temp src: Oral (11/01 0420)  Pulse: 98 (11/01  0420)  BP: 116/74 mmHg (11/01 0420)  Resp: 16 (11/01 0420)  SpO2: 97 % (11/01 0420)  Height: 172.7 cm (5' 7.99") (10/29 0600)  Weight: 65 kg (143 lb 4.8 oz) (10/29 0600)    24 Hour Vital Signs    Temp src:  [-]   Temp:  [36.6 C (97.9 F)-36.8 C (98.2 F)]   Pulse:  [92-100]   BP: (102-128)/(67-77)   Resp:  [16]   SpO2:  [97 %-99 %]     I/O Last 2 Completed Shifts:  In: 1340 [Oral:1300; Crystalloid:40]  Out: 1365 [Urine:1275; Other:90]    PHYSICAL EXAM:   General Appearance:awake, alert, NAD  Neuro Exam:   GCS: Eyes:4 Verbal:5 Motor:6  Pupils: OU: 3 mm, equal, briskly reactive to light  No pronator drift  Cranial Nerves: 2-12 grossly intact    Motor:    Deltoid  Biceps  Triceps  Wrist Flexion  Wrist Extension  Hand Intrinsics    Right  5 5 5 5 5 5    Left  5 5 5 5 5 5       Hip Flexion  Knee Extension  Knee Flexion  Dorsiflexion  Great Toe Extension  Plantar Flexion    Right  (pain) 4+  (pain) 4+ (pain)  4+ 5 5 5  Left  (pain) 4+ (pain) 4+ (pain)  4+ 5 5 5      Sensory: LT intact in all extremities        INCISIONS: CDI with staples. Incision erythematous, no drainage  Wound Drain(s): HV x1 dc/d     Labs:  Latest Chem--7 with Mg  Recent labs for the past 48 hours     04/08/13 0525    SODIUM 135    POTASSIUM 4.0    CHLORIDE 101    CARBON DIOXIDE TOTAL 27    UREA NITROGEN, BLOOD (BUN) 13    CREATININE BLOOD 0.60    GLUCOSE 90    MAGNESIUM (MG) --     Latest CBC  Recent labs for the past 48 hours     04/08/13 0525    WHITE BLOOD CELL COUNT 8.5    HEMOGLOBIN 12.8*    HEMATOCRIT 37.5*    PLATELET COUNT 232     Latest INR / APTT  Recent labs for the past 48 hours     04/08/13 0525    INR 0.98    APTT 32.1     ABG Last 24 hours   No results found for this basename: ARTPO2:*,ARTO2SAT:*,ARTPCO2:*,ARTPH:*,ARTHCO3:*,ARTBE:* in the last 24 hours    Cultures: None    PLAN:   Neuro:  ) T12 burst fracture, kyphotic deformity S/P T12-L2 posterior fusion  - Pain control. Pain Pharm recs implemented.   - Continue Neuro  exam Q4h  - TLSO brace when OOB  - Postop CT with good correction and adequate instrumentation and decompression at T12.  - PT/OT (phase IV on both)     CV: HDS.  Pulm: Stable on RA  ZO:XWRUEAV diet. Routine bowel care.  GU: Voiding spontaneously  FEN: NS at 125cc/hr, replete electrolytes PRN. SL when PO's good  DVT: SCDs. SQH  ID: Afebrile, no leukoyctosis  Lines: PICC 10/28  Dispo: Home when appropriate      Risa Grill  M.D.  Neurosurgery PGY1  Service Pager: [phone]  Personal Pager: [phone]  PI: [phone]      I agree with the assessment and plan as outlined in the resident's note.  Report electronically signed by Fernande Bras, MD. Attending

## 2013-04-08 NOTE — Nurse Assessment (Signed)
ASSESSMENT NOTE          Initial assessment completed and recorded in EMR.  Report received from night shift nurse and orders reviewed. Plan of Care reviewed and appropriate, discussed with Pt.. S/p T10-L2 posterior fusion, T12 laminectomy.  Pt up with walker and PT in the amin the halland tolerated well.   Pt. Continued to have back pain 8-9/10 and notified NP.  Pt said his pain down to 4/10 at 1800 after norco, baclofen and IV dilaudid given.  Report given to night nurse.   Zebedee Iba, RN

## 2013-04-09 ENCOUNTER — Inpatient Hospital Stay
Admission: EM | Admit: 2013-04-09 | Discharge: 2013-04-11 | DRG: 552 | Disposition: A | Discharge status: Home / Self Care | Payer: MEDICAID | Attending: Neurological Surgery | Admitting: Neurological Surgery

## 2013-04-09 DIAGNOSIS — Z4789 Encounter for other orthopedic aftercare: Secondary | ICD-10-CM | POA: Insufficient documentation

## 2013-04-09 DIAGNOSIS — F319 Bipolar disorder, unspecified: Secondary | ICD-10-CM | POA: Diagnosis present

## 2013-04-09 DIAGNOSIS — J45909 Unspecified asthma, uncomplicated: Secondary | ICD-10-CM | POA: Diagnosis present

## 2013-04-09 DIAGNOSIS — S22042D Unstable burst fracture of fourth thoracic vertebra, subsequent encounter for fracture with routine healing: Secondary | ICD-10-CM

## 2013-04-09 DIAGNOSIS — M545 Low back pain, unspecified: Principal | ICD-10-CM | POA: Diagnosis present

## 2013-04-09 MED ORDER — DOCUSATE SODIUM 100 MG CAPSULE
100.0000 mg | ORAL_CAPSULE | Freq: Two times a day (BID) | ORAL | Status: DC
Start: 2013-04-09 — End: 2013-04-09

## 2013-04-09 MED ORDER — MORPHINE ER 30 MG TABLET,EXTENDED RELEASE
30.0000 mg | EXTENDED_RELEASE_TABLET | Freq: Three times a day (TID) | ORAL | Status: DC
Start: 2013-04-09 — End: 2013-04-09
  Administered 2013-04-09: 30 mg via ORAL
  Filled 2013-04-09: qty 1

## 2013-04-09 MED ORDER — HEPARIN, PORCINE (PF) 5,000 UNIT/0.5 ML INJECTION SYRINGE
5000.0000 [IU] | INJECTION | Freq: Three times a day (TID) | INTRAMUSCULAR | Status: DC
Start: 2013-04-10 — End: 2013-04-11
  Administered 2013-04-10: 5000 [IU] via SUBCUTANEOUS
  Filled 2013-04-09 (×2): qty 1

## 2013-04-09 MED ORDER — POTASSIUM CHLORIDE 20 MEQ/L IN 0.9 % SODIUM CHLORIDE INTRAVENOUS
INTRAVENOUS | Status: DC
Start: 2013-04-10 — End: 2013-04-11
  Administered 2013-04-10 (×2): via INTRAVENOUS
  Filled 2013-04-09 (×6): qty 1000

## 2013-04-09 MED ORDER — DIAZEPAM 5 MG TABLET
5.0000 mg | ORAL_TABLET | Freq: Four times a day (QID) | ORAL | Status: DC | PRN
Start: 2013-04-09 — End: 2013-04-11
  Administered 2013-04-10 – 2013-04-11 (×4): 5 mg via ORAL
  Filled 2013-04-09 (×4): qty 1

## 2013-04-09 MED ORDER — DOCUSATE SODIUM 100 MG CAPSULE
100.0000 mg | ORAL_CAPSULE | Freq: Two times a day (BID) | ORAL | Status: DC
Start: 2013-04-10 — End: 2013-04-11
  Administered 2013-04-10 – 2013-04-11 (×4): 100 mg via ORAL
  Filled 2013-04-09 (×4): qty 1

## 2013-04-09 MED ORDER — DOCUSATE SODIUM 100 MG CAPSULE
100.0000 mg | ORAL_CAPSULE | Freq: Two times a day (BID) | ORAL | Status: DC
Start: 2013-04-10 — End: 2013-04-09

## 2013-04-09 MED ORDER — CYCLOBENZAPRINE 10 MG TABLET
10.0000 mg | ORAL_TABLET | Freq: Three times a day (TID) | ORAL | Status: DC
Start: 2013-04-09 — End: 2013-04-09
  Administered 2013-04-09: 10 mg via ORAL
  Filled 2013-04-09: qty 1

## 2013-04-09 MED ORDER — SENNOSIDES 8.6 MG TABLET
2.0000 | ORAL_TABLET | Freq: Every day | ORAL | Status: DC
Start: 2013-04-09 — End: 2013-04-09

## 2013-04-09 MED ORDER — MORPHINE ER 30 MG TABLET,EXTENDED RELEASE
30.0000 mg | EXTENDED_RELEASE_TABLET | Freq: Three times a day (TID) | ORAL | Status: DC
Start: 2013-04-10 — End: 2013-04-11
  Administered 2013-04-10 – 2013-04-11 (×5): 30 mg via ORAL
  Filled 2013-04-09 (×5): qty 1

## 2013-04-09 MED ORDER — MAGNESIUM HYDROXIDE 400 MG/5 ML ORAL SUSPENSION
30.0000 mL | Freq: Two times a day (BID) | ORAL | Status: DC | PRN
Start: 2013-04-09 — End: 2013-04-11

## 2013-04-09 MED ORDER — ACETAMINOPHEN 325 MG TABLET
650.0000 mg | ORAL_TABLET | ORAL | Status: DC | PRN
Start: 2013-04-09 — End: 2013-04-11

## 2013-04-09 MED ORDER — CYCLOBENZAPRINE 10 MG TABLET
10.0000 mg | ORAL_TABLET | Freq: Three times a day (TID) | ORAL | Status: DC
Start: 2013-04-10 — End: 2013-04-11
  Administered 2013-04-10 – 2013-04-11 (×4): 10 mg via ORAL
  Filled 2013-04-09 (×4): qty 1

## 2013-04-09 MED ORDER — DIAZEPAM 5 MG TABLET
5.0000 mg | ORAL_TABLET | Freq: Three times a day (TID) | ORAL | Status: DC | PRN
Start: 2013-04-09 — End: 2013-04-09

## 2013-04-09 MED ORDER — SENNOSIDES 8.6 MG TABLET
2.0000 | ORAL_TABLET | Freq: Every day | ORAL | Status: DC
Start: 2013-04-10 — End: 2013-04-11
  Administered 2013-04-10 (×2): 17.2 mg via ORAL
  Filled 2013-04-09 (×2): qty 2

## 2013-04-09 MED ORDER — BISACODYL 10 MG RECTAL SUPPOSITORY
10.0000 mg | RECTAL | Status: DC | PRN
Start: 2013-04-09 — End: 2013-04-11

## 2013-04-09 MED ORDER — CYCLOBENZAPRINE 10 MG TABLET
10.0000 mg | ORAL_TABLET | Freq: Three times a day (TID) | ORAL | Status: DC | PRN
Start: 2013-04-09 — End: 2013-04-09

## 2013-04-09 MED ORDER — HYDROCODONE 5 MG-ACETAMINOPHEN 325 MG TABLET
1.0000 | ORAL_TABLET | ORAL | Status: DC | PRN
Start: 2013-04-09 — End: 2013-04-11
  Administered 2013-04-10 – 2013-04-11 (×7): 2 via ORAL
  Filled 2013-04-09 (×7): qty 2

## 2013-04-09 NOTE — Nurse Focus (Signed)
Pt said he wants to leave AMA and he understands he won't be able to have discharge medications or walker as pt not willing to stay in the hospital till discharged by the MD which would be tomorrow the soonest per MD as just started on new medication, MS Contin for pain relief this afternoon.  Pt left AMA via ambulating with his wife at 1720 after signed the [facility] paper and talked with Dr. Claybon Jabs and charge nurse.Marland Kitchen PICC line removed by charge nurse at 1710.    Zebedee Iba RN

## 2013-04-09 NOTE — Nurse Assessment (Signed)
ASSESSMENT NOTE    Note Started: 04/09/2013, 12:48     Initial assessment completed and recorded in EMR.  Report received from night shift nurse and orders reviewed. Plan of Care reviewed and appropriate, discussed with patient.  S/P: T10-L2 posterior fusion, T12 laminectomy.   Pt saying the pain medication is not helping enough except after receiving IV dilaudid.  Pt up ambulating in the hall with walker and standby assist and independently in the room.   Pt had TLSO brace adjusted by orthotics and more teaching about the TLSO brace with OT this am.   Pt drowsy at times and encouraged pt to take a nap after lunch.  Pt now resting in bed.  Sometimes pt will get up near the bed without putting on the LTSO and explained to pt that whenever out of bed he should have his back brace on.  Also asked pt to call for assistance if he needs help putting the brace on.   Zebedee Iba, RN

## 2013-04-09 NOTE — ED Nursing Note (Signed)
Pt ambulatory to RR w steady gait, NAD noted.

## 2013-04-09 NOTE — ED Nursing Note (Signed)
Neurosurg @BS

## 2013-04-09 NOTE — ED Triage Note (Signed)
PT BIBA signed out AMA from here earlier had L spine fusion pt arrives in back brace. Pt reports he was walking up the stairs and felt severe pain in his back. Pt appears very drowsy. Reports "its from the meds they gave me at noon". Pt reports num,bness and tingling in his abd, denies any extremity complaints. Denies any incontinence.

## 2013-04-09 NOTE — Consults (Signed)
Pain Management Service Pharmacist  Note started: 04/09/2013,11:04    Reason for Admission:   Patient Active Problem List    Diagnosis Date Noted    Stable burst fx of  T12 fracture 04/04/2013     Overview Note:     Patient sustained a stable T12 burst fracture and was seen and examined by the neurosurgery spine service who do not recommend surgical intervention for this type of fracture. It was recommended that he be fitted for a TLSO back brace which is to be worn while sitting upright and standing at all times for 4-6 weeks. His pain is controlled with PO narcotics and flexeril. He is to follow up with the neurosurgery spine clinic in 4 weeks or sooner if he is experiencing worsening of symptoms.       Chronic back pain 04/04/2013    Back pain 12/23/2012    Fall 12/07/2012     Overview Note:     C-spine cleared.       Elbow contusion 11/28/2012    Contusion 10/31/2012    Back sprain 10/31/2012    Cough 10/06/2012    Abdominal pain 09/24/2012    Right medial knee pain 09/20/2012   Per Neurosurgery Procedure Note 04/05/13:   OPERATION DATE: 04/04/2013  INPATIENT OPERATION RECORD    PREOP DIAGNOSIS:    1. T12 burst fracture with conus injury, possible cauda equina syndrome  2. thoracolumbar kyphotic deformity.    POSTOP DIAGNOSIS:    Same.    SURGICAL PROCEDURE:    1. T12 decompressive laminectomy.  2. T10-L2 posterior arthrodesis instrumentation using pedicle screws x8, thoracolumbar rods x2, cross connector x1 with in situ autograft, cancellous allograft, and demineralized bone matrix. 3. Partial reduction of thoracolumbar kyphotic deformity    Patient appears: comfortable sitting in wheelchair talking to RN in hallway.  No appear pain as patient wheeled himself to his room for interview.  Patient able to answer questions appropriately but appeared intoxicated during interview (RN gave patient Dilaudid IV 1.5 mg prior to my interview), patient had hard time keeping his eyes open.  Patient  complains of pain at: surgical site mid-back    Patient's pain is 3-4 at rest and 7-8 with movement where 0 is no pain and 10 is severe pain. Average pain score prior to admission: 7-8    Patient describes pain as: sharp, achy, "like a vice", with some spasms - pain is constant.  No discription of any neuropathic symptoms given    Patient reports the following symptoms: C/O some drowsiness.  Denies nausea, vomiting, or itching    Diet: regular  Last Bowel Movement: 04/07/13 - patient does not feel constipated  Sleeping: pretty good but would awaken secondary to environment and pain  Pain impact on Physical Therapy/Incentive Spirometry:   Per PT Note 04/08/13:  Nurse cleared PT to see pt.  Reviewed spine precautions  [employer] TLSO with cues and min A  Sit to stand with FWWSBA, ambulates with FWW, PT assist with IV pole 200 feet, cues for wider base gait  Attempted to instruct pt to use walker to go up and down 1 step, pt adamently declined, say he had done it before, has no need for it.  Reviewed LE ROM ex in sitting, up in chair, call light in reach, transition care to nurse    Patient is in phase 4.    A: if pt were to be DC over the w/e, will need FWW, pt wanted his TLSO to  be loose, not receptive to recommendations    P: continue high level balance activities while pt still in hospital, could DC on FWW level    Vital Signs:  HR Pulse: 104 (04/09/13 0823), BP BP: 130/83 mmHg (04/09/13 0823), RR Resp: 16 (04/09/13 0823), Sedation Score Sedation score: Awake, alert, normal response to auditory stimuli. (04/09/13 0823), O2 Saturation SpO2: 99 % (04/09/13 0823).  Updated RASS score = -1    Significant PMH:   Past Medical History   Diagnosis Date    Unspecified mental or behavioral problem      bipolar    Unspecified asthma(493.90)        Lab of Note:   BASIC METABOLIC PANEL Recent labs for the past 72 hours     04/09/13 0530 04/08/13 0525 04/06/13 2330    GLUCOSE 118* 90 138*    UREA NITROGEN, BLOOD (BUN) 16 13 9      CREATININE BLOOD 0.74 0.60 0.55    SODIUM 136 135 133*    POTASSIUM 4.0 4.0 4.0    CHLORIDE 100 101 99    CARBON DIOXIDE TOTAL 27 27 27     CALCIUM 8.7 8.7 8.9     BLOOD COUNTS Recent labs for the past 72 hours     04/09/13 0530 04/08/13 0525 04/06/13 2330    WHITE BLOOD CELL COUNT 8.2 8.5 11.8*    RED CELL COUNT 3.69* 3.91* 4.16*    HEMOGLOBIN 12.4* 12.8* 13.8*    HEMATOCRIT 35.3* 37.5* 39.6*    MCV 95.6 95.9 95.1    MCH 33.5* 32.7 33.1*    RDW 13.1 13.4 13.2    PLATELET COUNT 239 232 235    NUCLEATED RBC/100 WBC -- -- --    BC COMMENTS -- -- --       Allergies:   Allergies   Allergen Reactions    Nsaids (Non-Steroidal Anti-Inflammatory Drug) Swelling       Analgesic Regimen Prior To Admission:   Norco 5/325 1 - 2 tabs PO q4h prn (avg 2 - 4 tabs/day)  Tramadol 50 mg prn (patient could not answer how many tabs avg per day) last filled 02/14/13 #30  Flexeril 10 mg PO q8h    Current Analgesic Regimen:    Received last 24 hours (0600 - 0600)  Dilaudid 1 - 1.5 mg IV q4h prn   6 mg  Norco 10/325 1 - 2 tabs PO q4h prn  8 tabs  Diazepam 5  - 10 mg PO q8h prn   30 mg  Baclofen 10 mg PO TID    30 mg    Discontinued Analgesic Regimen:  Dilaudid 1 mg IV x 1     1 mg  Norco 5/325 1 - 2 tabs PO q4h prn   4 tabs  Dilaudid 1 - 2 mg IV q4h prn   1 mg    Assessment:   Acute pain S/P surgery in patient with history of chronic pain.  Patient is opiate naive.  Pain may have inflammatory and spasm components.  At home, patients 24 hour opioid requirement equivalent to approximately 30 mg po morphine per day.  Patients 24 hour opioid requirement equivalent to approximately 295 mg po morphine per day.    Polysubstance abuse history puts patient at risk for opiate misuse/abuse/diversion.  Patient may benefit from long acting opiate and appears ready to transition off IV opiates.  Patient may benefit from adjustment to pain regimen.      Plan: Suggest:  1.  Start MS Contin 30 mg PO q8h  2.  Change Norco 10/325 dose to 1 - 2  tabs PO q4h prn pain not relieved by MS Contin  3.  Discontinue IV Dilaudid  4.  Start Flexeril 10 mg PO TID  5.  Discontinue Baclofen (patient states baclofen does not work for hime)  6.  Change Diazepam dose to 5 mg PO q6h prn spasms not relieved by Flexeril  7.  Bowel care  8.  Hold all sedating meds for RR less than 10 or RASS score between -2 and -5      Thank you for this consult. Please call with any questions.    Joanne Chars, Pharm.D.  Pain Management Service Pharmacist  Pager: [phone]        Time spent on consultation = 90 mins.

## 2013-04-09 NOTE — Progress Notes (Addendum)
DEPARTMENT OF NEUROLOGICAL SURGERY  [facility] PROGRESS NOTE      Name: Carl Mata  MRN: [id number]  Date:   04/10/2013     POD#:6      S/P:  T10-L2 PSF, T12 laminectomy      Allergies:   Nsaids (Non-Steroidal Anti-Inflammatory Drug)    Swelling     INTERVAL SUMMARY:   Pain adequately controlled. Off IV pain medications    IV MEDICATIONS  NaCl 0.9% 1,000 mL with Potassium Chloride 20 mEq, , IV, CONTINUOUS, Last Rate: 100 mL/hr at 04/10/13 0814      SCHEDULED MEDICATIONS  Cyclobenzaprine (FLEXERIL) Tablet 10 mg, ORAL, TID  Docusate (COLACE) Capsule 100 mg, ORAL, BID  Heparin PF 5,000 units/0.5 mL Injection 5,000 Units, SUBCUTANEOUS, Q8H  Morphine (MS CONTIN) SR 12hr Tablet 30 mg, ORAL, Q8H  Sennosides (SENOKOT) Tablet 17.2 mg, ORAL, Daily Bedtime        Current Vital Signs  Temp: 36.5 C (97.7 F) (11/02 0819)  Temp src: Oral (11/02 0819)  Pulse: 106 (11/02 0819)  BP: 114/76 mmHg (11/02 0819)  Resp: 18 (11/02 0819)  SpO2: 99 % (11/02 0819)  Height: 172.7 cm (5\' 8" ) (11/02 0041)  Weight: 64.229 kg (141 lb 9.6 oz) (11/02 0041)    24 Hour Vital Signs  Temp src:  [-]   Temp:  [36.4 C (97.6 F)-37.3 C (99.1 F)]   Pulse:  [106-112]   BP: (114-120)/(66-77)   Resp:  [16-18]   SpO2:  [95 %-100 %]     I/O Last 2 Completed Shifts:  In: 561.7 [Oral:240; Crystalloid:321.7]  Out: 450 [Urine:450]    Neuro Exam:   GCS: 15  Pupils: OU: 3 mm, equal, briskly reactive to light  MAE BUEs 5/5, BLEs 5/5 in distal groups, 4+/5 proximal with limitation due to pain  Intact sensation to LT in all extremities    Ventilator:       No Data Recorded     ARTERIAL BLOOD GASES   No results found for this basename: ARTPO2:*,ARTO2SAT:*,ARTPCO2:*,ARTPH:*,ARTHCO3:*,ARTBE:* in the last 48 hours              Labs:  CBC   Recent labs for the past 72 hours     04/09/13 0530 04/08/13 0525    WHITE BLOOD CELL COUNT 8.2 8.5    HEMOGLOBIN 12.4* 12.8*    HEMATOCRIT 35.3* 37.5*    PLATELET COUNT 239 232      CHEM 20 Recent labs for the past 48 hours      04/09/13 0530    SODIUM 136    POTASSIUM 4.0    CHLORIDE 100    CARBON DIOXIDE TOTAL 27    CREATININE BLOOD 0.74    UREA NITROGEN, BLOOD (BUN) 16    GLUCOSE 118*    CALCIUM 8.7    PROTEIN --    ALBUMIN --    ALKALINE PHOSPHATASE (ALP) --    ASPARTATE TRANSAMINASE (AST) --    BILIRUBIN TOTAL --    ALANINE TRANSFERASE (ALT) --    TRIGLYCERIDE --    URIC ACID, BLOOD --    LACTATE DEHYDROGENASE --    PHOSPHORUS (PO4) --    CHOLESTEROL --    MAGNESIUM (MG) --      SURGICAL DIC Recent labs for the past 48 hours     04/09/13 0530    INR 1.00    APTT 29.8    FIBRINOGEN --    D-DIMER --  PLAN:   Neuro:  ) T12 burst fracture, kyphotic deformity S/P T12-L2 posterior fusion  - Pain control. Pain Pharm recs implemented. No IV pain medications.   - Continue Neuro exam Q4h  - TLSO brace when OOB  - Postop CT with good correction and adequate instrumentation and decompression at T12.    CV: HDS.  Pulm: Stable on RA  ZO:XWRUEAV diet. Routine bowel care.  GU: Voiding spontaneously  FEN: IVF replete electrolytes PRN. SL when PO's good  DVT: SCDs. SQH  ID: Afebrile, no leukoyctosis  Lines: PICC 10/28  Misc: - PT/OT previously at Phase 4. Home when appropriate    Lissa Hoard, M.D. PGY2  [facility]  NS[facility] Svc Pager: [phone]  Direct Pager [phone]  PI# [phone]     I agree with the assessment and plan as outlined in the resident's note.  Report electronically signed by Fernande Bras, MD. Attending

## 2013-04-09 NOTE — Nurse Focus (Signed)
1610: No neuro changes noted throughout the night. VSS. States pain on back much controlled with PRN IV dilaudid, norco tab and valium tab ( pls see MAR ). Able to sleep in b/n assessment. Tolerates OOB to BR with TLSO brace on and FWW with stand by assist. Surgical incision with dressing on back dry and intact. Pt free from injury.    Anson Fret, RN

## 2013-04-09 NOTE — Nurse Focus (Signed)
Pt discharged as AMA.  Goodarzi in previously to talk about plan of care and implications of going AMA.  Pt chose to think about it.  Later called Charge RN to say he wanted to go AMA.  Ament MD discussed implications.  Pt signed paperwork and released AMA.   PICC d/c'd prior to leaving.

## 2013-04-09 NOTE — Progress Notes (Signed)
NEUROSURGERY RESIDENT UPDATE    Patient signed AMA forms. Discussed options at bedside and reiterated what multiple RNs and Dr. Annett Gula had already discussed with him regarding pain meds, physical therapy progress, and plans.  He has acknowledged these issues and has decided to leave regardless.  Forms counter-signed. On-call Staff aware.    Carl Mata. Carl Jabs, MD, MPH  Neurological Surgery, R5  Licensed Resident  Personal pager: [phone]  Service page: [phone]  PI# [phone]

## 2013-04-09 NOTE — H&P (Addendum)
NSG Brief H&P Update:    Pt returns from leaving AMA; stating he made a mistake and should never have left.  He has been gone for a total of 3-hrs.  No acute events or updates to H&P from 11/27 or progress note from today.    Temp: 37.3 C (99.1 F) (11/01 2159)  Temp src: Oral (11/01 2159)  Pulse: 107 (11/01 2159)  BP: 120/66 mmHg (11/01 2159)  Resp: 16 (11/01 2159)  SpO2: 96 % (11/01 2159)  Height: --  Weight: 63.504 kg (140 lb) (11/01 2158)      PLAN:   Neuro:  1) T12 burst fracture, kyphotic deformity S/P T12-L2 posterior fusion  - Pain control. Pain Pharm recs implemented.   - Continue Neuro exam Q4h  - TLSO brace when OOB  - PT/OT (phase IV on both)     CV: HDS.  Pulm: Stable on RA  NF:AOZHYQM diet. Routine bowel care.  GU: Voiding spontaneously  FEN: NS at 125cc/hr, replete electrolytes PRN. SL when PO's good  DVT: SCDs. SQH  ID: Afebrile, no leukoyctosis  Dispo: Home when appropriate    Jared D. Claybon Jabs, MD, MPH  Neurological Surgery, R5  Licensed Resident  Personal pager: [phone]  Service pager: [phone]  PI# [phone]    I agree with the assessment and plan as outlined in the resident's note.  Report electronically signed by Fernande Bras, MD. Attending

## 2013-04-09 NOTE — Allied Health Progress (Signed)
Occupational Therapy Progress Note   Date of Service: 04/09/2013   Time in: 910 Time (mins): 30   Unit/Floor: E5 OT Visit #: 1    S:  Pt reports minimal use of brace since last visit patient reports adjusting brace - orthotics informed - orthotics reset and locked brace. Pt's line and monitor intact and vitals within range for OT intervention.  Nurse cleared for therapy.     O:  Pre Position: sitting edge of bed without brace, Post Position: sitting edge of bed braced donned, Pain In: 10 /10, Pain Out: 5 /10  FOR NURSING USE: NURSING RECOMMENDATIONS / MOBILITY PHASE:    Phase I    Phase II        Command and physical response activation    Arousal/ orientation/ communication  Degree of Interaction: Low Cooperation     Command and verbal response activation    Patient and/or caregiver education       Arousal and orientation     Degree of Interaction: Comatose, Unarousable   Musculoskeletal Program:  AAROM, manual resistance,  Hand Power       Musculoskeletal Program:  Positioning Head to Feet: prevent subluxation, joint malalignment, manage tone, manage edema, visual-spatial orientation, PROM all limbs: proximal to distal, facilitate basic AROM         Musculoskeletal Program:  advancement (AROM, AAROM, resistive training, metered exercise UE/ LE)      Participation in beginning components of bed mobility:  Reaching, rolling, active LE    Positioning Head to Feet: prevent subluxation, joint malalignment, manage tone, manage edema, visual-spatial orientation    Sensorimotor Program: midline orientation, reflexes, tactile feedback  Sensorimotor Program: visual attention, midline orientation, righting reactions     Caregiver education and participation as appropriate    Supported sitting EOB, cardiac chair, wheelchair      Dependent Splint/Orthotic Application   Mobilize out of bed:  Passive Transfers Dependent through Max Assist ?Lift Team indicated          Mobilize out of bed:  Assess transfer type, level of assist,  tolerance         Sitting schedule implemented all shifts          EOB:  Supported, unsupported or challenged balance activities           Supported sitting exercise:  metered exercise UE / LE       ADL advancement: hand-over-hand, modeling         Other mobilization:  Tilt Table Program        Passive Splint/Orthotic Application     Phase III    Phase IV      Arousal /orientation/ communication  Degree of Interaction: Moderate Cooperation    Degree of InteractionChartered certified accountant for patient/ family      Patient and/or caregiver education as appropriate (incorporate any appropriate activity)   x   Advanced Musculoskeletal Program:  Resistive, metered exercise UE/LE      Musculoskeletal Program advancement (AROM, AAROM,  resistive training, metered exercise  UE/ LE, Object Manipulation)     x Advanced bed mobility / incorporate nursing all shifts    Sensorimotor Program: proprioception feedback, coordination reactions x Sensorimotor Program: timing, skilled voluntary control of limbs, position / direction change reactions     Caregiver education and participation  x Transfer training / incorporate nursing all shifts      Bed mobility advancement  x Functional transfer training (commode) / incorporate nursing all shifts  Sitting balance advancement:  Static and dynamic trunk activities,   x Pre-gait advancement     Advance out of bed sitting tolerance    x Gait program or (wheelchair mobility for wheelchair-dependent only), incorporate nursing all shifts     Active Assisted Transfer training and advancement   x Facilitate Typical  ADL and self-help skill performance     Standing activities (Pre-gait):  Supported / unsupported, Static/ dynamic, tilt table   x Active Splint/Orthotic Application     Gait training / assisted gait         Active participation in ADL advancement:  wheelchair level, chair,  vanity, toilet        Active Assistive Splint/Orthotic Application       SKILLED OCCUPATIONAL  PERFORMANCE ACTIVITY: Pt seen for essential functional task of lowerbody dressing, orthotics management, functional mobility, body mechanics   SELFCARE: lowerbody dressing, sink side grooming, toilet transfers, orthotics management  Assist Level: lowerbody dressing min due to spine precautions and inability to reach feet - patient reports has wife to assist upon discharged.  Sink side grooming supervised due to drowsiness from medication and decreased balance.  Orthotics management- donning abdominal binder and TLSO supervised after initial instruction on donning while maintaining body mechanics and minimizing twisting.  Toilet transfers supervised.      FUNCTIONAL MOBILITY/BALANCE: bed mobility, functional mobility  Assist Level: supervised due to decreased balance, drowsiness from medication and instruction on body mechanics - unable to tolerate hob flat for bed mobility - verbalized understanding of log roll        PATIENT/CAREGIVER EDUCATION: body mechanics, importance of wearing brace to ensure proper healing of spine  Response: verbalized understanding    ACTIVITY TOLERANCE: Physical: good, Cognitive/Psychosocial/Compliance: good,    A:   Problem deficits in: postop precautions , lack of condition knowledge, decreased balance and activity tolerance, pain  These problems limit: functional mobility, orthotics management, lowerbody dressing, bed mobility.  Skilled OT Intervention: patient education on Chief Financial Officer, facilitate balance reachtion  Pt Functional Performance and/or Change: minimal assistance for lowerbody dressing but reports will have assist (declined LH AE). Supervised for mobility   RECOMMENDATIONS:   ANTICIPATED POST-ACUTE RECOMMENDATIONS WHEN MEDICALLY STABLE: patient reports willing to go back to prior sober rehab program where wife would also participate with program - wife and fiends will be available to assist as needed       DME NEEDS: fww (needs may change through course of  medical recovery)  Post-Acute OT/PT/SLP Rehab Needs: continue OT 3-5x/wk  Burden of Care At Present Condition: minimal assistance for lowerbody dressing - supervised for functional mobility  Activity Tolerance for Rehab: good      P: Continue OT 3-5x/wk for therapeutic activity to address body mechanics, activities of daily living, functional mobility and reinforce safety, compliance and progression of function.      During hospital admission would benefit from skilled OT to address pending deficit areas to prevent complications and restore pt to or near prior level of function with necessary strategies and adaptations.    Reported By:  Dot Been, M.O.T, O.T.R./L  Occupational Therapist PI# [phone]  Dept. Of Physical Medicine & Rehabilitation / Acute Care Services  Pager Number / OT Scheduling [phone]

## 2013-04-10 NOTE — Consults (Signed)
Pain Management Service Pharmacist  Note started: 04/10/2013,11:23    Reason for Admission:   Patient Active Problem List    Diagnosis Date Noted    Stable burst fx of  T12 fracture 04/09/2013     Overview Note:     Patient sustained a stable T12 burst fracture and was seen and examined by the neurosurgery spine service who do not recommend surgical intervention for this type of fracture. It was recommended that he be fitted for a TLSO back brace which is to be worn while sitting upright and standing at all times for 4-6 weeks. His pain is controlled with PO narcotics and flexeril. He is to follow up with the neurosurgery spine clinic in 4 weeks or sooner if he is experiencing worsening of symptoms.       Chronic back pain 04/04/2013    Back pain 12/23/2012    Fall 12/07/2012     Overview Note:     C-spine cleared.       Elbow contusion 11/28/2012    Contusion 10/31/2012    Back sprain 10/31/2012    Cough 10/06/2012    Abdominal pain 09/24/2012    Right medial knee pain 09/20/2012   Per Neurosurgery Procedure Note 04/05/13:   OPERATION DATE: 04/04/2013  INPATIENT OPERATION RECORD    PREOP DIAGNOSIS:    1. T12 burst fracture with conus injury, possible cauda equina syndrome  2. thoracolumbar kyphotic deformity.    POSTOP DIAGNOSIS:    Same.    SURGICAL PROCEDURE:    1. T12 decompressive laminectomy.  2. T10-L2 posterior arthrodesis instrumentation using pedicle screws x8, thoracolumbar rods x2, cross connector x1 with in situ autograft, cancellous allograft, and demineralized bone matrix. 3. Partial reduction of thoracolumbar kyphotic deformity     Patient appears: comfortable as I entered room.  observed patient laying on his right side in bed texting on his phone in no appearant pain.  After introducing myself patient rolled back onto his back with no appearant pain.  After asking how he did last night the patient rolled back onto his right side to text again with no appearant pain.  Asked  patient why he was not wearing his brace - he stated that he did not need it while in bed.  With all the movements the patient did in bed he did not show any signs of pain.    Patient complains of pain at: surgical site mid-back    Patient's pain is 8 at rest and 8 - 9 with movement where 0 is no pain and 10 is severe pain. Average pain score prior to admission: 7-8    Patient describes pain as: sharp, achy, "like a vice", with some spasms - pain is constant. No discription of any neuropathic symptoms given    Patient reports the following symptoms: Denies nausea, vomiting, drowsiness, or itching    Diet: regular  Last Bowel Movement: per patient - this morning  Sleeping: not too much secondary to pain  Pain impact on Physical Therapy/Incentive Spirometry: IS not assessed  Per PT Note 04/08/13:  Nurse cleared PT to see pt.  Reviewed spine precautions  [employer] TLSO with cues and min A  Sit to stand with FWWSBA, ambulates with FWW, PT assist with IV pole 200 feet, cues for wider base gait  Attempted to instruct pt to use walker to go up and down 1 step, pt adamently declined, say he had done it before, has no need for it.  Reviewed LE  ROM ex in sitting, up in chair, call light in reach, transition care to nurse    Patient is in phase 4.    A: if pt were to be DC over the w/e, will need FWW, pt wanted his TLSO to be loose, not receptive to recommendations    P: continue high level balance activities while pt still in hospital, could DC on FWW level    Vital Signs:  HR Pulse: 106 (04/10/13 0819), BP BP: 114/76 mmHg (04/10/13 0819), RR Resp: 18 (04/10/13 0819), Sedation Score Sedation score: Asleep, did not awaken patient (04/10/13 0900), O2 Saturation SpO2: 99 % (04/10/13 0819) updated RASS score = 0    Significant PMH:   Past Medical History   Diagnosis Date    Unspecified mental or behavioral problem      bipolar    Unspecified asthma(493.90)        Lab of Note:   BASIC METABOLIC PANEL Recent labs for the past 72  hours     04/09/13 0530 04/08/13 0525    GLUCOSE 118* 90    UREA NITROGEN, BLOOD (BUN) 16 13    CREATININE BLOOD 0.74 0.60    SODIUM 136 135    POTASSIUM 4.0 4.0    CHLORIDE 100 101    CARBON DIOXIDE TOTAL 27 27    CALCIUM 8.7 8.7     BLOOD COUNTS Recent labs for the past 72 hours     04/09/13 0530 04/08/13 0525    WHITE BLOOD CELL COUNT 8.2 8.5    RED CELL COUNT 3.69* 3.91*    HEMOGLOBIN 12.4* 12.8*    HEMATOCRIT 35.3* 37.5*    MCV 95.6 95.9    MCH 33.5* 32.7    RDW 13.1 13.4    PLATELET COUNT 239 232    NUCLEATED RBC/100 WBC -- --    BC COMMENTS -- --       Allergies:   Allergies   Allergen Reactions    Nsaids (Non-Steroidal Anti-Inflammatory Drug) Swelling       Analgesic Regimen Prior To Admission:   Norco 5/325 1 - 2 tabs PO q4h prn (avg 2 - 4 tabs/day)  Tramadol 50 mg prn (patient could not answer how many tabs avg per day) last filled 02/14/13 #30  Flexeril 10 mg PO q8h    Current Analgesic Regimen:    Received last 24 hours (0600 - 0600)   MS Contin 30 mg PO q8h     60 mg  Norco 5/325 1 - 2 tabs PO q4h prn    4 tabs  Diazepam 5 mg PO q6h prn    5 mg  Flexeril 10 mg PO TID     10 mg    Discontinued Analgesic Regimen:  Dilaudid 1 mg IV x 1      1 mg  Baclofen 10 mg PO TID     30 mg  Diazepam 5 - 10 mg PO q8h prn    30 mg  Norco 10/325 1 - 2 tabs PO q4h prn   4 tabs  Dilaudid 1 - 1.5 mg IV q4h prn    1.5 mg  Dilaudid 1 - 2 mg IV q4h prn    1 mg    Assessment:   Acute pain S/P surgery in patient with history of chronic pain. Patient is opiate naive. Pain may have inflammatory and spasm components. At home, patients 24 hour opioid requirement equivalent to approximately 30 mg po morphine per day. Patients 24 hour  opioid requirement equivalent to approximately 295 mg po morphine per day.  Polysubstance abuse history puts patient at risk for opiate misuse/abuse/diversion.  Patient has high subjective pain scores but functionality appears to be improving and each time I have observed patient he  does not exhibit any signs of pain.  Patient may benefit from adjustment to pain regimen.     Plan: Suggest:  1.  Continue MS Contin as currently ordered  2.  Consider increasing Norco 5/325 dose to Norco 10/325 1 - 2 tabs PO q4h prn pain not relived by MS Contin  3.  Continue Diazepam and Flexeril as ordered  4.  Bowel care:  5.  Hold all sedating meds for RR less than 10 or RASS score between -2 and -5      Thank you for this consult. Please call with any questions.    Joanne Chars, Pharm.D.  Pain Management Service Pharmacist  Pager: [phone]      Time spent on consultation = 90 mins.

## 2013-04-10 NOTE — Allied Health Progress (Signed)
Attempted to see pt for another initial OT eval (eval was done before pt went AMA recently and was being followed for tx, but new OT order received today), but pt preferred resting in bed, was in discomfort, OT did reiterate use of TLSO when OOB. OT to follow up next time when able.    Rowena Bonzon, OTR/L  Occupational Therapist II  Dept. Of Physical Medicine & Rehabilitation / Acute Care Services  PI#: [id number]  Vocera: [phone]

## 2013-04-10 NOTE — Nurse Focus (Signed)
Family visiting and pt requesting for family to take him outside.  Neurosurgeon aware and will allow pt to go outside with family via wheelchair.  Advised pt not to smoke, pt verbalized understanding.  Pt states that he has these muscle spasms across his back and oral pain medications were not working, he prefers the IVP.  Pt fully understands that he is on a pain medication regimen as extensively discussed with him by nursing staff and pharmacist.  Family is also supportive of his pain management regimen.

## 2013-04-10 NOTE — Progress Notes (Signed)
NEUROSURGERY RESIDENT UPDATE    Called twice about [name] demanding IV pain meds.  I have seen him twice in the ED after he left AMA and then returned to the hospital for re-admission.  He is now refusing to wear his TLSO when OOB.  His medication regimen has been directed by our pain pharmacists.  Furthermore, when the RN attends to him, offering pain medicines that he does have available, he is often asleep in bed. We will re-discuss his regimen with pain pharmacy today but overuse and seeking behaviors are concerning.     Carl Mata. Claybon Jabs, MD, MPH  Neurological Surgery, R5  Licensed Resident  Personal pager: [phone]  Service page: [phone]  PI# [phone]

## 2013-04-10 NOTE — Nurse Assessment (Signed)
ASSESSMENT NOTE    Note Started: 04/10/2013, 22:05     Initial assessment completed and recorded in EMR @ 1942.  Report received from day shift nurse and orders reviewed. Plan of Care reviewed and appropriate, discussed with patient. Plan of care: pain control, monitor vitals, TLSO when OOB, encourage mobility as tolerated and assist with ADLS as needed.  Mason Jim,  RN

## 2013-04-10 NOTE — Nurse Assessment (Signed)
ASSESSMENT NOTE    Note Started: 04/10/2013, 08:14     Initial assessment completed and recorded in EMR.  Report received from night shift nurse and orders reviewed. Plan of Care reviewed and appropriate, discussed with patient.  Donavan Foil, RN RN

## 2013-04-10 NOTE — Nurse Focus (Signed)
NURSE PROGRESS NOTE    Note Started: 04/10/2013, 06:18           Pt readmitted to hospital after leaving AMA on 04/09/13. At approximately 0300, pt began demanding IV pain medication and stating that he is in 10/10 pain despite frequently falling asleep between care, and having received MS contin, norco & valium. MD notified. MD stated that the pt is on a pain regime recommended by the pain pharmacy which will continue to be followed, however no additional pain medications will be ordered at this time. After the pt was told this information, he became agitated and started yelling, "F#!@ this hospital", until eventually falling back to sleep.     At approximately 0600 pt again began demanding IV pain medication & demanded to speak with the Charge RN who came to the bedside to speak with pt. When offered his PRN norco's, pt stated, "Why?? That doesn't even work, I need something stronger". A few minutes later, pt was found standing up at the bedside without a TLSO brace on.  When asked to put the TLSO on or get back into bed, pt refused even after being educated on the importance of wearing a TLSO brace when OOB. MD notified.     Mason Jim, RN

## 2013-04-10 NOTE — Nurse Assessment (Signed)
TRANSFER NOTE - RECEIVING    Note Started: 04/10/2013, 02:13      Patient received at 0041 hours from ER unit by gurney. Pt condition stable . patient oriented to room and unit. Plan of care reviewed and updated. Mason Jim, RN

## 2013-04-11 ENCOUNTER — Other Ambulatory Visit: Payer: Self-pay

## 2013-04-11 MED ORDER — DIAZEPAM 5 MG TABLET
1.0000 | ORAL_TABLET | Freq: Three times a day (TID) | ORAL | 0 refills | Status: DC | PRN
Start: 2013-04-11 — End: 2014-11-30
  Filled 2013-04-11: qty 50, 17d supply, fill #0

## 2013-04-11 MED ORDER — DOCUSATE SODIUM 100 MG CAPSULE
1.0000 | ORAL_CAPSULE | Freq: Two times a day (BID) | ORAL | 0 refills | Status: DC
Start: 2013-04-11 — End: 2015-02-09
  Filled 2013-04-11: qty 50, 25d supply, fill #0

## 2013-04-11 MED ORDER — SENNOSIDES 8.6 MG TABLET
2.0000 | ORAL_TABLET | Freq: Every day | ORAL | 0 refills | Status: DC | PRN
Start: 2013-04-11 — End: 2015-02-09
  Filled 2013-04-11: qty 50, 25d supply, fill #0

## 2013-04-11 MED ORDER — MORPHINE ER 30 MG TABLET,EXTENDED RELEASE
1.0000 | EXTENDED_RELEASE_TABLET | Freq: Three times a day (TID) | ORAL | 0 refills | Status: DC | PRN
Start: 2013-04-11 — End: 2013-04-22
  Filled 2013-04-11: qty 90, 30d supply, fill #0

## 2013-04-11 NOTE — Allied Health Procedure (Signed)
Phase Summary - Mobility Pilot    Mobility Guidelines    Last Documented Mobility Phase:      Current Phase: Phase 4    Mobility Session Initiated?:Yes    Mobility Session Completed? Yes      Lorren Splawn [place] [id number]  phone [phone]

## 2013-04-11 NOTE — Nurse Focus (Signed)
04/11/2013 14:35    04/11/2013  14:36  Discharge Note    Discharge orders received. AHS, medications, plan of care, and follow up reviewed and sent with patient, verbalized understanding. Sister at bedside. Belongings given to patient, picked up prescriptions from [facility]. FWW to be delivered by [facility] but patient was anxious to leave and di not want to wait for walker. Peripheral IV discontinued per policy, tip intact, bleeding controlled. Patient refused wheelchair transport, ambulated off unit with TLSO brace on. Patient discharged to home. Plan of Care and MPER completed. Lucienne Capers, RN

## 2013-04-11 NOTE — Discharge Summary (Addendum)
DEPARTMENT OF NEUROLOGICAL SURGERY  HOSPITAL DISCHARGE SUMMARY  Note Started: 04/11/2013, 08:23  Admission Date: 04/09/2013 10:00 PM  Discharge Date: 04/11/2013   Admission Diagnosis: Unstable burst fx of fourth thoracic vertebra with routine healing [V54.17]  Discharge Diagnosis: same    PAST MEDICAL HISTORY:  Past Medical History   Diagnosis Date    Unspecified mental or behavioral problem      bipolar    Unspecified asthma(493.90)        OPERATIONS / PROCEDURES:  T12 decompressive laminectomy- T10 to L2 posterior arthrodesis/instrumentation    CONSULTATIONS:  Pain Management     HISTORY OF PRESENT ILLNESS:  Carl Mata is a 37yr old male T 12 burst fracture s/p in June of 2014 who presents with progressively worsening lower back pain, weakness, saddle anesthesia, and intermittent urinary incontinence. Patient also has acute onset of hyperesthesias since yesterday morning. MRI shows worsening kyphotic deformity with stable but significant canal compromise.       COURSE:  The patient was admitted to the hospital and taken to the OR given the acuity of his new onset symptoms. The patient underwent anesthesia and surgery without any major issues. He was observed in the ICU for 2 days with resolution of his lower extremity weakness, and significant improved pain. He no longer experienced urinary incontinence upon removal of his foley initially however due to difficulty with voiding he had a bladder rest for 1 day with foley being placed back. After removal of his foley he did well and was voiding spontaneously.  He worked with PT and OT he was able to ambulate with a FWW and was cleared by PT and OT with plan for outpatient treatment if patient desired. The patient initially left AMA on Saturday 11/1 however returned to the ED due to pain. He was readmitted and worked with PT and OT and cleared for discharge.  His girl friend was trained on home care. He was given explicit post op instructions for home. He was also  seen by pain pharmacy and discharged on MS Contin, Norco 5, and Valium per their recommendations. He was told to follow up with Dr. Donita Brooks in 1-2 weeks after discharge. He agreed with these plans.      EXAM:   GSS 15.   Motor: HF 5/5 Bilaterally, Bilateral KF KE 5/5 5/5, Bilateral AKDF and AKPF 5/5. 5/5 Bilateral upper extremities.   Sensory: Intact with only slight numbness around the incision. No saddle anesthesia.   2/2 in all upper and lower extremity reflex groups.       INFECTIONS / COMPLICATIONS:  none    The patient's past medical, family, and social history was reviewed and confirmed.    DISPOSITION:  Patient is stable for discharge to: Home with Girl friend  Patient will follow up with Dr. Donita Brooks in 2 weeks    MEDICATIONS PRESCRIBED for DISCHARGE:  Current Discharge Medication List   Norco 5 1-2 tabs Q4 hours prn Pain 100 tabs NR  MS Contin SR 30mg  Q 8 prrn Pain 100 tabs NR  Valium 5mg  Q8 hr prn spams 50 tabs NR  Docusate 100mg  BID PRN constipation 50 tabs NR  Senokot 17.2 mg QHS prn constipation 50 tabs NR   CONTINUE these medications which have NOT CHANGED    Details   ALBUTEROL INHA       Cyclobenzaprine (FLEXERIL) 10 mg Tablet Take 1 tablet by mouth every 8 hours.  Qty: 90 tablet, Refills: 0  Docusate (COLACE) 100 mg Capsule Take 1 capsule by mouth 2 times daily.  Qty: 60 capsule, Refills: 2             A total of 60 minutes was spent coordinating patient discharge.     [name], MD   Intern  Pager#: [phone]                  I agree with the assessment and D/C plan as outlined in the note.  Report electronically signed by Fernande Bras, MD. Attending

## 2013-04-11 NOTE — Discharge Instructions (Signed)
General Discharge Instructions      Discomfort        1. Right after surgery, pain is managed with narcotic medications. Because narcotic pain pills are addictive, they are used for a limited period (2 to 4 weeks). Also, their regular use may cause constipation, so drink lots of water and eat high fiber foods. Laxatives (e.g., Dulcolax, Senokot, Milk of Magnesia) may be bought without a prescription. Thereafter, pain is managed with acetaminophen (e.g., Tylenol).  DO NOT DRIVE while taking pain medications.      2. Ask your surgeon before taking nonsteroidal anti-inflammatory drugs (NSAIDs) (e.g., aspirin; ibuprofen, Advil, Motrin, Nuprin; naproxen sodium, Aleve). NSAIDs may cause bleeding and interfere with healing.       Restrictions      1. Avoid these activities until your pain is well controlled or as directed by your doctor:      -do not bend, twist, stretch, or lift objects over 5 pounds      -do not raise arms above your head      -do not sleep on your stomach      -do not climb too many stairs or sit for long periods of time      2. Do not drive for 2 to 4 weeks after surgery or until discussed with your surgeon.      3. Housework and yard-work are not permitted until the first follow-up office visit. This includes gardening, mowing, vacuuming, ironing, and loading/unloading the dishwasher, washer, or dryer.         Activity      1. Gradually return to your normal activities. Walking is encouraged; start with a short distance during the 1st two weeks and then gradually increase to 1 to 2 miles daily. A physical therapy program may be recommended.    Bathing/Incision Care      1. You may shower as directed by your surgeon. Do not take a tub bath or submerge yourself in water for 4 weeks. Pat your incision dry with a soft towel to avoid irritation.      2. Inspect the incision line twice daily.      3.  Steri-strips may cover the incision. After showering, gently pat dry the steri-strips. Gently remove  steri-strips after one week. Sutures or staples that remain in place when you go home will need to be removed within 7-10 days. Contact  the office to come in to remove the sutures or staples.      5. Wear loose clothing over the incision site to maintain comfort and prevent skin irritation.    When to Call Your Doctor      1. If your temperature exceeds 101 degrees F or if the incision begins to separate or show signs of infection, such as redness, swelling, pain, or drainage.      2. If your headache persists after 48 hours.      3. If you have sudden severe back pain, sudden onset of leg weakness and spasm, loss of bladder and/or bowel function - this is an emergency - go to a hospital and call your surgeon.      Please follow up with Dr. Donita Brooks in outpatient clinic in 7- 10 for post op follow up.

## 2013-04-11 NOTE — Discharge Planning (AHS/AVS) (Signed)
Christoper Allegra [phone] to deliver a front wheeled walker to your bedside prior to discharge.    If you have any questions or concerns related to the services listed above that were coordinated for your discharge, please contact [facility] Clinical Case Management Department at [phone].    Electronically signed by:  Cleotilde Neer, RN, Clinical Case Manager  [phone]      Here are some community resources for long term care should you need them in the future:    [place] [employer] (AAA)          (800) Y8323896          www.[place]care.gov    Medicare                                                                          www.medicare.gov  (800) 725-451-0705    [place]  716-741-4967 For [facility] only   For all other counties, refer to website  http://www.ward.com/    [employer] for Nursing [facility])    [phone]    www.canhr.org    If you have any further questions related to this matter, you can contact Clinical Case Management at [phone].

## 2013-04-11 NOTE — Allied Health Progress (Signed)
Carl Mata MRN: [id number]      Clinical Care Management Progress Note    Comments: Per floor nurse - patient needing FWW for discharge home today, order in EMR.  Verified with [employer] - contracted provider - DME referral completed in [facility].    Further follow-up needed: No further discharge planning needs identified.    Date: 04/11/2013  Time: 12:02    Electronically signed by:  Cleotilde Neer, RN, Clinical Case Manager  [phone]  [phone]

## 2013-04-11 NOTE — Nurse Assessment (Signed)
ASSESSMENT NOTE    Note Started: 04/11/2013, 08:23     Initial assessment completed and recorded in EMR.  Report received from night shift nurse and orders reviewed. Plan of Care reviewed and appropriate, discussed with patient.  Lucienne Capers, RN

## 2013-04-11 NOTE — Allied Health Consult (Signed)
PM& R -- ACUTE CARE SERVICE      OCCUPATIONAL THERAPY EVALUATION    Name: Carl Mata  MRN: [id number]   Date of Service: 04/11/2013   Time In: 0830   Total Time: 20 Minutes                                                                                                                                   INTAKE INFORMATION AND HISTORY:                                 Therapy Consult(s) Ordered: Physical Therapy and Occupational Therapy       Primary Service: (A) Neurosurgery       Date of Admission: 04/09/2013        Date of Onset (Medicare only): 04/09/2013       Diagnosis: T12 burst fracture, kyphotic deformity S/P T12-L2 posterior fusion       Precautions: Fall precautions and TLSO for out of bed       Language: English         History of Present Illness/Injury (Including pertinent test results & procedures): (Taken from MD note in EMR)  Pt returns from leaving AMA; stating he made a mistake and should never have left. He has been gone for a total of 3-hrs.    Neuro:  1) T12 burst fracture, kyphotic deformity S/P T12-L2 posterior fusion  - Pain control. Pain Pharm recs implemented.   - Continue Neuro exam Q4h  - TLSO brace when OOB  - PT/OT (phase IV on both)     CV: HDS.  Pulm: Stable on RA  AV:[id number] diet. Routine bowel care.  GU: Voiding spontaneously  FEN: NS at 125cc/hr, replete electrolytes PRN. SL when PO's good  DVT: SCDs. SQH  ID: Afebrile, no leukoyctosis  Dispo: Home when appropriate         Past Medical/Surgical History: n/a in EMR          Social History: n/a in EMR      SUBJECTIVE EXAM:       Current Living Situation: With family/friends       Support at Time of Discharge: girlfried, pt does not work at the moment and will support pt Systems developer at Discharge: Multi-level home       Environmental Barriers:  Stairway in home has rails - Left       Assistive Devices Owned: None       Adaptive Equipment Owned: N/A       Prior Level of Function: Independent with all ADLS       Mental  Status: Alert and oriented x4       Pain Level / Chief Complaint: 6  /10  Pt expresses pain in back but was premedicated prior to evaluation. RN clearance was received  prior to evaluation. Pt was very pleasant and cooperative.    OBJECTIVE EXAMINATION:       Observation: Pt with no lines connected. Pt was very pleasant and cooperative and was very receptive to therapy.         Hand Dominance: Left    Extremity Status: WFL     ROM MMT Sensation Tone Coordination   RUE   Shoulders:90 degrees (back precautions were reinforced  Elbow Flexion:WFL  Elbow extension:WFL  Hand:WFL 3/5 grossly throughout during observation. Pt's strength n/t 2/2 back precautions. Pt denies numbness and tingling in BUE Pt appears with normal tone Finger to nose: WFL  Alternating finger tips:WFL   LUE   Shoulders:90 degrees (back precautions were reinforced  Elbow Flexion:WFL  Elbow extension:WFL  Hand:WFL 3/5 grossly throughout during observation. Pt's strength n/t 2/2 back precautions. Pt denies numbness and tingling in BUE Pt appears with normal tone Finger to nose: WFL  Alternating finger tips:WFL   RLE   [facility] WFL Pt with slight instability, SBA with functional mobility   LLE   [facility] WFL Pt with slight instability, SBA with functional mobility.   Other:  n/a        Activities of Daily Living:        Key: Dep=Dependent     Max=Maximal     Mod=Moderate     Min=Minimal     CG=Contact Guard     SB=Stand-By     S=Supervised     I=Independent     NA=Not Applicable     NT=Not Tested     N=Normal     G=Good     F=Fair     P=Poor     U=Unable     FWW=Front-Wheeled Walker     AC=Axillary Crutches     SPC=Single-Point Cane       Level of Assistance: Dep Max Mod Min CG SB S I NA NT Comments   Bed Mobility (roll/self-adjust)      x     Via log rolling   Sidelying To Sit / Supine To Sit      x     Via log rolling   Transfer Bed To Chair     x         Transfer Bed To Toilet     x      At toilet with bedside commode   Transfer To Tub / Shower           x    Grooming / Hygiene     x      Pt required cues for twisting of the back   Toileting      x        Bathing Upper          x    Bathing Lower          x    Upper Body Dress    x       With sitting EOB with donning of TLSO   Lower Body Dress  x         Pt was instructed to have girlfriend assist with skill 2/2 back precautions   Self-feeding      x            Cognitive / Visual Perceptual:         Intact Impaired NT Comments   Orientation x      Memory x      Attention  x      Following Directions x      Communication x      Problem Solving x      Judgment/ Safety x      Visual Fields x          ASSESSMENT / RECOMMENDATIONS / EDUCATION:           Occupational Therapy Assessment / Discharge Recommendations:  OT evaluation was completed. Pt's functional capacity is limited pt's multiple fractures in the spine. Pt also limited 2/2 to pain. Pt was educated on back precautions and alternatives to movement with good body mechanics. Pt will continue to benefit from further skilled OT to reinforce proper body mechanics and reinforce safety ADL/functional mobility completion. DME recommendations: none at this time. DC recommendations: home with assistance         Patient / Caregiver Education Today: OT POC              Method of Teaching: demonstration and verbal             Learner: patient            Response: verbalizes understanding and able to give return demonstration    GOALS / TREATMENT PLAN / [place] PROGNOSIS:       Patient's Goals: "I want to go home"       Occupational Therapy Goals:       Prior To Discharge Patient Will Demonstrate:   Feeding: Pt will complete self feeding activity while seated on bedside chair x3 meals with use of TLSO, independent  Hygiene/Grooming: Pt will complete g/h task x3 task at sinkside with FWW prn, independent.  Dressing: Upper Body: Pt will complete TLSO donning/doffing while seated at EOB, independently  Dressing:  Lower Body: Pt's caregiver will assist pt with LB  dressing in order to prevent and exacerbate back pt, independent  Functional transfer to toilet: Pt will complete toiletting transfers with use of toilet with RTS, mod I  Upper Extremity: Pt will adhere to complete ADLs while preventing use of overhead reach, independently              Prognosis: Good          Treatment Plan:   Bed mobility Training, Transfer Training, Sitting Balance, Functional Mobility, ADL / Self Care, Therapeutic Exercise, Splinting, Adaptive Equipment, Durable Medical Equipment, Energy Conservation / Work Simplification and Patient / Sales promotion account executive         Recommended Frequency of Treatment: 1-2 times per day         Recommended Duration of Treatment: 2 weeks then [facility]      Patient / Caregiver Participation / Education   Has the plan of care been explained to the patient / caregiver? yes   Is the patient able to understand the plan of care?                         yes     Does the patient / caregiver(s) agree with the plan of care? yes   List barriers that may interfere with plan of care    None   Comments: n/a             Interim Report Due:  04/25/2013         Patient seen for additional occupational therapy treatment; see 04/11/2013 occupational therapy progress note for details.    x   No additional treatment rendered this encounter.  Reported by:  Lavera Guise MS OTR/L  Occupational Therapist I  Dept. Of Physical Medicine & Rehabilitation/ [place] [phone]  Pager/OTScheduling [phone]

## 2013-04-11 NOTE — Allied Health Consult (Signed)
PM & R -- ACUTE CARE SERVICE      PHYSICAL THERAPY EVALUATION     Name: Carl Mata   MRN: [id number]   Date of Service: 04/11/2013  Time In: 0930    Total Time: 30 Minutes                                                                                                                                                  INTAKE INFORMATION AND HISTORY:                       Therapy Consult(s) Ordered: Physical Therapy  Primary Service: (A) Neurosurgery   Date of Admission: 04/09/2013  Date of Onset (Medicare only):    Diagnosis: s/p T10-L2 PSF  Language: English    Precautions: TLSO for out of bed    History of Present Illness/Injury (Including pertinent test results & procedures):  From EMR:     History of Present Illness/Injury (Including pertinent test results & procedures): (Taken from MD note in EMR)  Pt returns from leaving AMA; stating he made a mistake and should never have left. He has been gone for a total of 3-hrs.    Neuro:  1) T12 burst fracture, kyphotic deformity S/P T12-L2 posterior fusion  - Pain control. Pain Pharm recs implemented.   - Continue Neuro exam Q4h  - TLSO brace when OOB  - PT/OT (phase IV on both)       Past Medical/Surgical History:    Past Medical History   Diagnosis Date    Unspecified mental or behavioral problem      bipolar    Unspecified asthma(493.90)    Past Surgical History:    FUSION THORACIC POSTERIOR                       N/A 04/04/2013      Comment:Procedure: FUSION THORACIC POSTERIOR;  Surgeon:               Fernande Bras, MD;  [facility];                 Service: Neurosurgery    LAMINECTOMY THORACIC POSTERIOR                  N/A 04/04/2013      Comment:Procedure: LAMINECTOMY THORACIC POSTERIOR;                 Surgeon: Fernande Bras, MD;  [facility];               Service: Neurosurgery      Social History:        SUBJECTIVE EXAM:    Current Living Situation: With family/friends  Support at Time of Discharge:  wife  Environment at Discharge:  Multi-level  home  Environmental Barriers:  No entry steps  Assistive Devices Owned: None  Adaptive Equipment Owned: N/A  Prior Level of Function: Ambulating independently without assistive device  Mental Status: Alert and oriented x4    Pain Level / Chief Complaint: 5/10 pain back      OBJECTIVE EXAMINATION:    Observation: 37 y/o male, wearing TLSO     Extremity Status: WFL       ROM MMT Sensation Tone Coordination   RUE        LUE        RLE        LLE        Comments:      Demonstrated Functional Activities:   Key: Dep=Dependent     Max=Maximal     Mod=Moderate     Min=Minimal     CG=Contact Guard     SB=Stand-By     S=Supervised     I=Independent     NA=Not Applicable     NT=Not Tested     N=Normal     G=Good     F=Fair     P=Poor     U=Unable     FWW=Front-Wheeled Walker     AC=Axillary Crutches     SPC=Single-Point Cane     Level of Assistance Dep Max Mod Min CG SB S I NA NT Comments   Rolling          x      Scooting          x      Sidelying/ Supine to Sit        x      Transfer Sit to Stand        x      Transfer Bed to Chair        x      Gait          x   400 ft without assistive device    Up/Down Step/Stairs        x   Up and down 23 steps with handrail   Comments: pt mobilizing without assistive device without  problem      Balance:       N G F P U NA NT Comments   Sitting Balance - Static x          Sitting Balance - Dynamic x          Standing Balance - Static x          Standing Balance - Dynamic x          Comments: no loss of balance during ambulation        ASSESSMENT / RECOMMENDATIONS/ EDUCATION:    Physical Therapy Assessment and Discharge Recommendations:  37 y/o male , s/p PSF. Pt is mobilizing without problem , without assistive device. Pt is able to manage his own TLSO from bedside     Patient / Caregiver Education Today: dt without assistive device on level and steps     Method of Teaching: demonstration and verbal     Learner: patient    Response: able to give return demonstration    GOALS / TREATMENT  PLAN / FUNCTIONAL PROGNOSIS:  Patient's Goals: go home     Physical Therapy Goals:    Pt will discharge to home, with understanding of safety considerations discussed.    Prognosis:  Patient Seen One Time Only; No Additional Therapy Indicated     Treatment Plan:  n/a     Recommended Frequency of Treatment: N/A     Recommended Duration of Treatment: n/a    Patient / Caregiver Participation / Education   Has the plan of care been explained to the patient / caregiver? yes   Is the patient able to understand the plan of care?   yes    Does the patient / caregiver(s) agree with the plan of care? yes    List Barriers that may interfere with plan of care:   None    Interim Report Due:  04/25/2013     Patient seen for additional physical therapy treatment; see 04/11/2013 physical therapy progress note for details.    x   No additional treatment rendered this encounter.     Reported by:  Daneen Schick PT [phone]  phone [phone]

## 2013-04-11 NOTE — Allied Health Progress (Signed)
Orthotics/Prosthetics Outpatient Progress Note    Progress Note: Left msg with nurse to order FWW from distribution. F/u as needed    Kae Heller, [facility]   Cert #: [id number]      Dept. Of PM&R: Orthotics/ Prosthetics  [phone] (phone)  [phone] (beeper)

## 2013-04-11 NOTE — Discharge Summary (Addendum)
DEPARTMENT OF NEUROLOGICAL SURGERY  HOSPITAL DISCHARGE SUMMARY  Note Started: 04/09/2013, 08:23  Admission Date: 04/09/2013 10:00 PM Discharge Date: 04/09/2013   Admission Diagnosis: Unstable burst fx of fourth thoracic vertebra with routine healing [V54.17]  Discharge Diagnosis: same    PAST MEDICAL HISTORY:  Past Medical History   Diagnosis Date    Unspecified mental or behavioral problem      bipolar    Unspecified asthma(493.90)        OPERATIONS / PROCEDURES:  T12 decompressive laminectomy- T10 to L2 posterior arthrodesis/instrumentation    CONSULTATIONS:  Pain Management     HISTORY OF PRESENT ILLNESS:  Carl Mata is a 37yr old male T 12 burst fracture s/p in June of 2014 who presents with progressively worsening lower back pain, weakness, saddle anesthesia, and intermittent urinary incontinence. Patient also has acute onset of hyperesthesias since yesterday morning. MRI shows worsening kyphotic deformity with stable but significant canal compromise.       COURSE:  The patient was admitted to the hospital and taken to the OR given the acuity of his new onset symptoms. The patient underwent anesthesia and surgery without any major issues. He was observed in the ICU for 2 days with resolution of his lower extremity weakness, and significant improved pain. He no longer experienced urinary incontinence upon removal of his foley initially however due to difficulty with voiding he had a bladder rest for 1 day with foley being placed back. After removal of his foley he did well and was voiding spontaneously. He worked with PT and OT he was able to ambulate with a FWW and was cleared by PT and OT with plan for outpatient treatment if patient desired. The patient left AMA on Saturday 11/1 after prolonged period of counseling and not having cleared PT OT.     EXAM:   GSS 15.   Motor: HF 5/5 Bilaterally, Bilateral KF KE 5/5 5/5, Bilateral AKDF and AKPF 5/5. 5/5 Bilateral upper extremities.   Sensory:  Intact with only slight numbness around the incision. No saddle anesthesia.   2/2 in all upper and lower extremity reflex groups.       INFECTIONS / COMPLICATIONS:  none    The patient's past medical, family, and social history was reviewed and confirmed.    DISPOSITION:  Patient is stable for discharge to: Home with Girl friend      MEDICATIONS PRESCRIBED for DISCHARGE:  Current Discharge Medication List   None   CONTINUE these medications which have NOT CHANGED    Details   ALBUTEROL INHA       Cyclobenzaprine (FLEXERIL) 10 mg Tablet Take 1 tablet by mouth every 8 hours.   Qty: 90 tablet, Refills: 0      Docusate (COLACE) 100 mg Capsule Take 1 capsule by mouth 2 times daily.   Qty: 60 capsule, Refills: 2             A total of 60 minutes was spent coordinating patient discharge.     [name], MD Intern  Pager#: [phone]       I agree with the assessment and plan as outlined in the resident's note.  Report electronically signed by Fernande Bras, MD. Attending

## 2013-04-17 ENCOUNTER — Inpatient Hospital Stay (HOSPITAL_COMMUNITY): Payer: MEDICAID

## 2013-04-17 ENCOUNTER — Inpatient Hospital Stay: Payer: MEDICAID

## 2013-04-17 ENCOUNTER — Encounter: Payer: Self-pay | Admitting: Emergency Medicine

## 2013-04-17 ENCOUNTER — Inpatient Hospital Stay
Admission: EM | Admit: 2013-04-17 | Discharge: 2013-04-18 | DRG: 862 | Discharge status: Against Medical Advice | Payer: MEDICAID | Attending: Neurological Surgery | Admitting: Neurological Surgery

## 2013-04-17 DIAGNOSIS — I10 Essential (primary) hypertension: Secondary | ICD-10-CM | POA: Diagnosis present

## 2013-04-17 DIAGNOSIS — Y9289 Other specified places as the place of occurrence of the external cause: Secondary | ICD-10-CM | POA: Insufficient documentation

## 2013-04-17 DIAGNOSIS — Y838 Other surgical procedures as the cause of abnormal reaction of the patient, or of later complication, without mention of misadventure at the time of the procedure: Secondary | ICD-10-CM | POA: Diagnosis present

## 2013-04-17 DIAGNOSIS — A419 Sepsis, unspecified organism: Secondary | ICD-10-CM | POA: Diagnosis present

## 2013-04-17 DIAGNOSIS — Z5900 Homelessness unspecified: Secondary | ICD-10-CM | POA: Insufficient documentation

## 2013-04-17 DIAGNOSIS — Z981 Arthrodesis status: Secondary | ICD-10-CM | POA: Insufficient documentation

## 2013-04-17 DIAGNOSIS — J45909 Unspecified asthma, uncomplicated: Secondary | ICD-10-CM | POA: Diagnosis present

## 2013-04-17 DIAGNOSIS — T8149XA Infection following a procedure, other surgical site, initial encounter: Secondary | ICD-10-CM | POA: Insufficient documentation

## 2013-04-17 DIAGNOSIS — T8140XA Infection following a procedure, unspecified, initial encounter: Principal | ICD-10-CM | POA: Diagnosis present

## 2013-04-17 DIAGNOSIS — F319 Bipolar disorder, unspecified: Secondary | ICD-10-CM | POA: Diagnosis present

## 2013-04-17 MED ORDER — DEPRECATED D5W 100ML
7.5000 mg/kg | Freq: Once | INTRAMUSCULAR | Status: AC
Start: 2013-04-17 — End: 2013-04-17
  Administered 2013-04-17: 530 mg via INTRAVENOUS
  Filled 2013-04-17: qty 2.12

## 2013-04-17 MED ORDER — MAGNESIUM SULFATE 2 GRAM/50 ML (4 %) IN WATER INTRAVENOUS PIGGYBACK
2.0000 g | INJECTION | INTRAVENOUS | Status: DC | PRN
Start: 2013-04-17 — End: 2013-04-18

## 2013-04-17 MED ORDER — ONDANSETRON HCL (PF) 4 MG/2 ML INJECTION SOLUTION
4.0000 mg | Freq: Two times a day (BID) | INTRAMUSCULAR | Status: DC | PRN
Start: 2013-04-17 — End: 2013-04-18

## 2013-04-17 MED ORDER — MAGNESIUM OXIDE 400 MG (241.3 MG MAGNESIUM) TABLET
800.0000 mg | ORAL_TABLET | ORAL | Status: DC | PRN
Start: 2013-04-17 — End: 2013-04-18

## 2013-04-17 MED ORDER — NACL 0.9% IV BOLUS - DURATION REQ
2000.0000 mL | Freq: Once | INTRAVENOUS | Status: AC
Start: 2013-04-17 — End: 2013-04-17
  Administered 2013-04-17: 2000 mL via INTRAVENOUS

## 2013-04-17 MED ORDER — DOCUSATE SODIUM 50 MG/5 ML ORAL LIQUID
100.0000 mg | Freq: Two times a day (BID) | ORAL | Status: DC
Start: 2013-04-17 — End: 2013-04-18

## 2013-04-17 MED ORDER — CALCIUM 500 MG (AS CALCIUM CARBONATE 1,250 MG) TABLET
2500.0000 mg | ORAL_TABLET | ORAL | Status: DC | PRN
Start: 2013-04-17 — End: 2013-04-18

## 2013-04-17 MED ORDER — ACETAMINOPHEN 325 MG TABLET
325.0000 mg | ORAL_TABLET | Freq: Once | ORAL | Status: AC
Start: 2013-04-17 — End: 2013-04-17
  Administered 2013-04-17: 325 mg via ORAL

## 2013-04-17 MED ORDER — PIPERACILLIN-TAZOBACTAM 4.5 GRAM INTRAVENOUS SOLUTION
4.5000 g | Freq: Once | INTRAVENOUS | Status: AC
Start: 2013-04-17 — End: 2013-04-17
  Administered 2013-04-17: 4.5 g via INTRAVENOUS
  Filled 2013-04-17: qty 4500

## 2013-04-17 MED ORDER — VANCOMYCIN 1 GRAM/200 ML IN DEXTROSE 5 % INTRAVENOUS PIGGYBACK
1000.0000 mg | INJECTION | Freq: Once | INTRAVENOUS | Status: AC
Start: 2013-04-17 — End: 2013-04-17
  Administered 2013-04-17: 1000 mg via INTRAVENOUS
  Filled 2013-04-17: qty 200

## 2013-04-17 MED ORDER — VANCOMYCIN 1 GRAM/200 ML IN DEXTROSE 5 % INTRAVENOUS PIGGYBACK
1000.0000 mg | INJECTION | Freq: Two times a day (BID) | INTRAVENOUS | Status: DC
Start: 2013-04-18 — End: 2013-04-18
  Administered 2013-04-18: 1000 mg via INTRAVENOUS
  Filled 2013-04-17: qty 200

## 2013-04-17 MED ORDER — CEFTRIAXONE 1 GRAM SOLUTION FOR INJECTION
1.0000 g | INTRAMUSCULAR | Status: DC
Start: 2013-04-18 — End: 2013-04-18
  Administered 2013-04-18: 1 g via INTRAVENOUS
  Filled 2013-04-17: qty 1

## 2013-04-17 MED ORDER — NACL 0.9% IV BOLUS - DURATION REQ
1000.0000 mL | Freq: Once | INTRAVENOUS | Status: DC
Start: 2013-04-17 — End: 2013-04-17

## 2013-04-17 MED ORDER — DIAZEPAM 2 MG TABLET
2.0000 mg | ORAL_TABLET | Freq: Once | ORAL | Status: AC
Start: 2013-04-17 — End: 2013-04-17
  Administered 2013-04-17: 2 mg via ORAL
  Filled 2013-04-17: qty 1

## 2013-04-17 MED ORDER — ACETAMINOPHEN 650 MG/20.3 ML ORAL SOLUTION
650.0000 mg | ORAL | Status: DC | PRN
Start: 2013-04-17 — End: 2013-04-18

## 2013-04-17 MED ORDER — MAGNESIUM HYDROXIDE 400 MG/5 ML ORAL SUSPENSION
30.0000 mL | Freq: Two times a day (BID) | ORAL | Status: DC | PRN
Start: 2013-04-17 — End: 2013-04-18

## 2013-04-17 MED ORDER — METOCLOPRAMIDE 5 MG/ML INJECTION SOLUTION
10.0000 mg | Freq: Four times a day (QID) | INTRAMUSCULAR | Status: DC | PRN
Start: 2013-04-17 — End: 2013-04-18

## 2013-04-17 MED ORDER — WHITE PETROLATUM-MINERAL OIL 83 %-15 % EYE OINTMENT
0.2500 [in_us] | TOPICAL_OINTMENT | OPHTHALMIC | Status: DC | PRN
Start: 2013-04-17 — End: 2013-04-18

## 2013-04-17 MED ORDER — MORPHINE 2 MG/ML INJECTION SYRINGE
1.0000 mg | INJECTION | INTRAMUSCULAR | Status: DC | PRN
Start: 2013-04-17 — End: 2013-04-18
  Administered 2013-04-17 – 2013-04-18 (×5): 2 mg via INTRAVENOUS
  Filled 2013-04-17 (×5): qty 1

## 2013-04-17 MED ORDER — POTASSIUM CHLORIDE 10 MEQ/100ML IN STERILE WATER INTRAVENOUS PIGGYBACK
10.0000 meq | INJECTION | INTRAVENOUS | Status: DC | PRN
Start: 2013-04-17 — End: 2013-04-18

## 2013-04-17 MED ORDER — BISACODYL 10 MG RECTAL SUPPOSITORY
10.0000 mg | RECTAL | Status: DC | PRN
Start: 2013-04-17 — End: 2013-04-18

## 2013-04-17 MED ORDER — POTASSIUM CHLORIDE 20 MEQ/L IN 0.9 % SODIUM CHLORIDE INTRAVENOUS
INTRAVENOUS | Status: DC
Start: 2013-04-17 — End: 2013-04-18
  Administered 2013-04-17 – 2013-04-18 (×2): via INTRAVENOUS
  Filled 2013-04-17 (×2): qty 1000

## 2013-04-17 MED ORDER — CALCIUM GLUCONATE 3 GRAM/100 ML IN 0.9% SODIUM CHLORIDE IV
3000.0000 mg | INTRAVENOUS | Status: DC | PRN
Start: 2013-04-17 — End: 2013-04-18

## 2013-04-17 MED ORDER — GADODIAMIDE 5 MMOL/10 ML (287 MG/ML) INTRAVENOUS SYRINGE
10.0000 mL | INJECTION | INTRAVENOUS | Status: AC
Start: 2013-04-17 — End: 2013-04-17
  Administered 2013-04-17: 10 mL via INTRAVENOUS

## 2013-04-17 MED ORDER — ACETAMINOPHEN 325 MG TABLET
650.0000 mg | ORAL_TABLET | Freq: Once | ORAL | Status: AC
Start: 2013-04-17 — End: 2013-04-17
  Administered 2013-04-17: 650 mg via ORAL

## 2013-04-17 MED ORDER — POTASSIUM CHLORIDE 20 MEQ/15 ML ORAL LIQUID
20.0000 meq | ORAL | Status: DC | PRN
Start: 2013-04-17 — End: 2013-04-18

## 2013-04-17 NOTE — ED Nursing Note (Signed)
Pt awake and alert, +csms, denies numbness or tingling in all extremities, moving all extremities, following commands and communicating clearly.  Pt speaking on the phone with with father. Pt requesting pain medication, will provide meds per MAR.

## 2013-04-17 NOTE — ED Triage Note (Signed)
Pt biba for eval of back pain. Pt walked [place] to meet friend walking from [facility] for eval of back pain. Pt s/p spine surgery on t12. Pt states pain increased after walking, took 30mg  Morphine and 20mg  valium for the pain. Pt now slow to answer, unable to keep eyes open, gcs 14.  Pt to room

## 2013-04-17 NOTE — ED Nursing Note (Signed)
Pt back from MRI 

## 2013-04-17 NOTE — Consults (Addendum)
DEPARTMENT OF NEUROLOGICAL SURGERY  GENERAL CONSULTATION    Consulting Service:  ED    Date of Admission:   04/17/2013  2:28 PM Date of Service: 04/17/2013   Note Started: 04/17/2013, 14:51 Name of Requesting Attending: Wilson         REASON FOR CONSULTATION: Fevers, back pain, wound infection    HISTORY OF PRESENT ILLNESS:    37yo male with T12 burst fracture s/p T12 laminectomy, T10-L2 posterior fusion by Dr. Selena Batten on 04/04/13 who presents to the ED with fevers, chills, night sweats, and severe back pain at the site of his surgery. He was doing well until approximately 2 days ago when he woke up in a pool of sweat. He has been having fevers and chills since then. He denies new weakness, paresthesias, urinary or bowel incontinence or other neurologic symptoms. He also denies neck stiffness or altered mental status, and was oriented but was very slow to answer questions in the ED and would fall asleep between questions.    A friend who has been taking care of him says that he is basically homeless and goes from one woman's house to another. Several days ago he took half a bottle of MS contin and was seen at [facility] for overdose. Both the patient and his friend deny recent meth use and deny any hx of IV drug use. The patient reportedly last smoked meth several months ago.    He has a hx trauma in May 2014(fall from [facility] unit 6 feet above ground onto buttocks) with T12 burst fracture, initially treated in a brace, but returned to care with pain, saddle anesthesia, urinary incontinence and MRI showing worsening kyphotic deformity which prompted surigcal intervention earlier this month.    PMH:  (Limited to chart review due to patient's somnolence. He denies any medical problems.)  Bipolar disorder  Asthma    PSH:  T12 burst fx s/p T10-L2 fusion on 04/04/13    Medications:  MS contin 30mg  Q8 PRN severe pain  Hydrocodone 1-2 tabs Q4 PRN pain  Senna PRN    Social hx: Unemployed, homeless, living with (possibly 2?) women at  various houses.  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 Smokes and drinks occasionally.   Family Hx: Not obtained.    The patient's past medical, family, and social history was reviewed and did not confirm due to AMS.    VITAL SIGNS:  Vital Signs (Last Recorded):  Temp: 37.6 C (99.7 F) (04/17/13 1714)  Temp src: Oral  Pulse: 109  BP: 121/66 mmHg  Resp: 14  SpO2: 95 %      PHYSICAL EXAM:  Neuro Exam:  GCS: Eyes: 3 Verbal: 5 Motor: 6- will close eyes unless asked to open them, falls asleep but easily arousable  Pupils: OD size: 2 mm, reaction: brisk             OS size: 2 mm, reaction: brisk  Cranial Nerves: 2-12 intact    Motor:    Deltoid Biceps Triceps Wrist Flexion Wrist Extension Hand Intrinsics   Right 5 5 5 5 5 5    Left 5 5 5 5 5 5       Hip Flexion Knee Extension Knee Flexion Dorsiflexion Great Toe Extension Plantar Flexion   Right 5 5 5 5 5 5    Left 5 5 5 5 5 5      Reflexes:   Biceps Triceps Brachio-radialis Patellar Achilles Plantar Flexor Response   Right 1+ 1+ 1+ 3+  3+ up   Left 1+ 1+ 1+ 2+ 2+ absent               Rectal: not examined          Hoffman-Troemnor: absent bilaterally          Pectoralis: absent bilaterally  Sensory: decreased sensation at knees due to distant trauma. Intact at feet, shins, thighs.   Incision with clear erythema surrounding lesion, skin edges approximated but with induration, no palpable fluid collection    IMAGING: MRI spine shows  Date/Time obtained:  04/17/2013    LAB TESTS / STUDIES:  I personally reviewed the following:       * Image(s): MRI spine    Latest CMP  Recent labs for the past 48 hours     04/17/13 1530 04/17/13 1450    SODIUM 137 --    POTASSIUM 4.0 --    CHLORIDE 102 --    CARBON DIOXIDE TOTAL 30 --    UREA NITROGEN, BLOOD (BUN) 7* --    CREATININE BLOOD 0.68 --    E-GFR, AFRICAN AMERICAN >60 --    E-GFR, NON-AFRICAN AMERICAN >60 --    GLUCOSE 120* --    CALCIUM 8.5* --    PROTHROMBIN TIME -- --    ALBUMIN -- 2.7*    ALKALINE  PHOSPHATASE (ALP) -- 94    ALANINE TRANSFERASE (ALT) -- 26     Latest CBC  Recent labs for the past 48 hours     04/17/13 1530    WHITE BLOOD CELL COUNT 10.4    HEMOGLOBIN 9.9*    HEMATOCRIT 29.7*    PLATELET COUNT 473*     Latest INR / APTT  Recent labs for the past 48 hours     04/17/13 1530    INR 1.10    APTT 32.7     Tox screen: pending  Blood alcohol: not obtained    ASSESSMENT:   37yo male with hx T12 burst fracture s/p T10-L2 fusion on 10/27, presents with fevers, chills, elevated ESR, CRP and leukocytosis. MRI shows cellulitis but no discrete fluid collections and no epidural involvement.    RECOMMENDATIONS:   - Admit to ICU for observation, given altered LOC, presumably somnolence from substance abuse.   - Empiric treatment with antibiotics.   - Possible central line placement for hemodynamic instability.   - Monitor for urinary retention or incontinence, which were his presenting symptoms.    Alysia Penna, MD Resident    This patient was seen, evaluated, and care plan was developed with the resident.  I agree with the assessment and plan as outlined in the resident's note.  I personally reviewed the image and agree with the resident's assessment.  Report electronically signed by Fernande Bras, MD. Attending

## 2013-04-17 NOTE — ED Nursing Note (Signed)
Abx continued per order.  Temp low-grade.  Remains drowsy but easily responsive, follows commands.

## 2013-04-17 NOTE — ED Nursing Note (Signed)
Pt in MRI as of shift change; verbal report given to Morrie Sheldon, Charity fundraiser.

## 2013-04-17 NOTE — ED Nursing Note (Signed)
Spoke with Dr. Janan Ridge regarding plan of care, updated MD that pt is no longer somnolent as she had seen him previously today.  Pt to go to ICU as of now per Dr. Janan Ridge.

## 2013-04-17 NOTE — ED Nursing Note (Signed)
Received report and assumed pt care.  Pt drowsy; follows commands; oriented.  Log-rolled to right side; noted erythema surrounding approx 15 cm long peri-spinal incision.  Febrile.  No drainage.      IV fluids and abx started per order.  Pt requiring MRI; has parole anklet to LLE; CN [place] attempted to contact pt's P.O. M. Sherilyn Cooter.  [employer] officer instructed to cut anklet and will continue to attempt to contact parole officer.  Anklet removed and placed in pt's belongings bag; pt aware of plan; verbalizes understanding.  Will contact MRI.

## 2013-04-17 NOTE — ED Initial Note (Signed)
EMERGENCY DEPARTMENT PHYSICIAN NOTE - YOSHIAKI KREUSER       Date of Service:   04/17/2013  2:28 PM Patient's PCP: No Pcp No Pcp   Note Started: 04/17/2013 14:34 DOB: 1976/01/11             Chief Complaint   Patient presents with    Back Pain           The history provided by the patient.  Interpreter used: No     Carl Mata is a 37yr old male, with a past medical history significant for unspecified asthma, mental or behavioral problem, BIBA to the ED with a chief complaint of back pain that began several hours ago. [facility] 3-4 days ago, patient reports he had T12 surgery at [facility]. He states he was walking to get food with his wife; later experienced hematemesis, lightheadedness after food consumption. He states his back pain management has not helped. Denies etoh. Normal BM. Pt wearing chest brace for comfort.     A full history, including pertinent past medical, family and social history was reviewed.    HISTORY:  1    *Wound infection after surgery     Allergies   Allergen Reactions    Nsaids (Non-Steroidal Anti-Inflammatory Drug) Swelling      Past Medical History:    Unspecified mental or behavioral problem                      Unspecified asthma(493.90)                                 Past Surgical History:    Fusion thoracic posterior                       N/A 04/04/2013    Laminectomy thoracic posterior                  N/A 04/04/2013   Social History    Marital Status: SINGLE              Spouse Name:                       Years of Education:                 Number of children:               Occupational History    None on file    Social History Main Topics    Smoking Status: Current Every Day Smoker        Packs/Day: 0.50  Years: 15        Types: Cigarettes    Smokeless Status: Not on file                       Alcohol Use: No              Drug Use: Yes                Special: Methamphetamine       Comment: states he used meth >1 yr ago    Sexual Activity: Not on file          Other Topics             Concern    None on file    Social History Narrative  As of 04/07/2013:     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 Reportedly on parole in [place]. Used to work as a Designer, fashion/clothing.    Lives in [place], currently "on the [address]," has a girlfriend but does not live with her. Doesn't know where he will live next. Says his GF's worker is looking into this.    H/o alcohol dependence (reports being sober for 1 year), + tobacco, + drug use (meth; reports last use >1 year ago).    ---------------------------     Review of patient's family history indicates:    No FH spine problems [Other] *                                    Review of Systems   Constitutional: Positive for fever (subjective) and activity change. Negative for chills.   HENT: Negative for facial swelling.    Respiratory: Negative for chest tightness and shortness of breath.    Cardiovascular: Negative for chest pain and palpitations.   Gastrointestinal: Positive for vomiting. Negative for abdominal pain, diarrhea and constipation.   Genitourinary: Positive for frequency. Negative for dysuria.   Musculoskeletal: Positive for back pain. Negative for joint swelling, neck pain and neck stiffness.   Skin: Negative.    Neurological: Negative for weakness.   Psychiatric/Behavioral: Negative.    All other systems reviewed and are negative.        TRIAGE VITAL SIGNS:  Temp: 38.8 C (101.8 F) (04/17/13 1425)  Temp src: Oral (04/17/13 1425)  Pulse: 109 (04/17/13 1425)  BP: 121/66 mmHg (04/17/13 1425)  Resp: 14 (04/17/13 1425)  SpO2: 95 % (04/17/13 1425)  Weight: 65 kg (143 lb 4.8 oz) (04/17/13 1800)    Physical Exam   Nursing note and vitals reviewed.  Constitutional: He is oriented to person, place, and time. He appears well-developed and well-nourished. No distress.   HENT:   Head: Normocephalic and atraumatic.   Mouth/Throat: Mucous membranes are normal.   Eyes: Pupils are equal, round, and reactive to light.   Pupils 3  mm equal and reactive BL.      Neck: Normal range of motion. Neck supple.   Non-tender.   Cardiovascular: Regular rhythm, normal heart sounds and intact distal pulses.  Tachycardia present.  Exam reveals no gallop and no friction rub.    No murmur heard.  Pulmonary/Chest: Effort normal and breath sounds normal. No respiratory distress. He has no wheezes. He has no rales.   Lungs clear to auscultations BL.      Abdominal: Soft. He exhibits no distension.   Musculoskeletal:   Midline surgical wound to back with staples; erythematous.  Warm to touch TTP. No drainage.    Neurological: He is alert and oriented to person, place, and time.   5/5 motor strength intact to all 4 extremities. Sensation intact to all extremities.    Skin: Skin is warm and dry. He is not diaphoretic.   Psychiatric: He has a normal mood and affect. His speech is slurred.         INITIAL ASSESSMENT & PLAN, MEDICAL DECISION MAKING, ED COURSE:  Carl Mata is a 37yr old male who presents with a chief complaint of back pain. After history and exam, I feel the diagnosis is most likely wound infection. Differential diagnosis includes, but is not limited to wound infection, wound dehiscence, pna, UTI, post-surgical  infection. .  The patient presents with  signs of hemodynamic instability and will require IV access and NS bolus as an immediate intervention. Initial treatment and studies to evaluate this problem will include serial exams, labs, imaging, EKG, analgesia as needed and antiemetics as needed.         ED Course Update: The patient was observed in the ED. The results of the ED evaluation were notable for the following:    ED Course Update:    The results of the ED evaluation were notable for the following:    Pertinent lab results (reviewed and interpreted independently by me): cbc, bmp unremarkable -- wbc 10.6  Lactic 2  UA negative  LFTs negative    Pertinent imaging results (reviewed and interpreted independently by me):   CXR -- bilateral  airspace opacities consistent with pneumonia    Consults:  A consult was obtained from the NSG service to evaluate for wound infection.  They recommend admission.      Medications:  Vanco, amikacin, zosyn, given for severe sepsis from surgical wound infection.    Old records: I reviewed the patient's prior medical record from admission 11/2.  Pertinent information was relevant to this encounter: T12 laminectomy.    Serial Evaluations:   16:12 back pain improved, tachycardia improving but still 108, NSG at bedside      Further MDM  Additional data reviewed? N/A        Patient Summary: 37yo M with wound infection from surgical wound, bilateral pneumonia, and resulting sepsis.  ABx given per severe sepsis protocol.  NSG consulted immediately and at bedside -- recommend MRI and admission.    Sepsis MDM:    Patient presented acutely ill and met SIRS criteria of HR > 90, Temp < 36 or Temp > 38 and High RR or low PaCO2 on primary assessment.  Upon final ED assessment, the patient was noted to meet criteria for:    Sepsis suspected due to lung and wound source.       Sepsis Bundle Implementation and Early Goal Directed Therapy    Therapies/Interventions:  Fluid Resuscitation was aggressively started with normal saline in a 2000 L bolus. Following this initial fluid challenge, the patient was noted to have improved bp.   Antibiotics Empiric amikacin, zosyn, vanco was given to treat infectious sources including lung and wound; Blood, cultures were obtained.      Early Goal Directed Therapy Initiated:  NO      Patient will be admitted to med/surg with NSG service for further care.          LAST VITAL SIGNS:  Temp: 38.2 C (100.8 F) (04/18/13 0753)  Temp src: Oral (04/18/13 0753)  Pulse: 103 (04/18/13 0755)  BP: 138/77 mmHg (04/18/13 0753)  Resp: 13 (04/18/13 0755)  SpO2: 95 % (04/18/13 0755)  Weight: 67.7 kg (149 lb 4 oz) (04/18/13 0755)      Clinical Impression:   1. Severe sepsis  2. Wound infection s/p surgery  3.  Pneumonia bilateral    Anticipated work up needed: IV abx      Disposition: Admit. Anticipate the patient will require greater than 2 nights of admission due to severe sepsis.        MDM COMPLEXITY  Overall Complexity of MDM is: high          PRESENT ON ADMISSION:  Are any of the following four conditions present or suspected on admission: decubitus ulcer, infection from an intravascular device, infection due to  an indwelling catheter, surgical site infection or pneumonia? Surgical site infection. and Pneumonia    PATIENT'S GENERAL CONDITION:  Serious: Vital signs may be unstable and not within normal limits. Patient is acutely ill. Indicators are questionable.    DISCLAIMER:   This document serves as my personal record of services taken in my presence. It was created on 04/17/2013 on my behalf by Bosie Clos, a trained medical scribe.     I have reviewed this document and agree that this note accurately reflects the history and exam findings, the patient care provided, and my medical decision making.    This patient was seen, evaluated, and the care plan was developed in conjunction with Corrie Dandy, MD - Resident Physician. I agree with the findings and plan as outlined in our combined note.      04/17/2013  Electronically Signed By:   Jonn Shingles. Andrey Campanile, MD, Associate Professor, Department of Emergency Medicine  Attending Physician, Department of Emergency Medicine  [facility]

## 2013-04-17 NOTE — ED Nursing Note (Signed)
Report from [place], [place]. Pt in MRI at this time.

## 2013-04-17 NOTE — ED Nursing Note (Signed)
Pt reports recent d/c from hospital s/p spinal fusion. States "feeling down bc of the meds and so my friend gave me some uppers". Denies IVDU. Reports "felt fine until today". Pt wihtout home at this time and unable to f/u appropriately with home care. Wound on thorasic spine presents with staples in place, redness, swelling, tenderness noted, no drainage at this time. Reports urinary c/o odor and hesitancy.

## 2013-04-18 MED ORDER — DOCUSATE SODIUM 100 MG CAPSULE
100.0000 mg | ORAL_CAPSULE | Freq: Two times a day (BID) | ORAL | Status: DC
Start: 2013-04-18 — End: 2013-04-18

## 2013-04-18 NOTE — Progress Notes (Addendum)
Neurosurgery Resident Update Note    Called to the bedside by nursing for the patient threats on leaving. When I arrived he stated his intent to leave against medical advice. I discussed with him that he has a serious infection in his back that required IV antibiotics (per infectious disease consult, note pending) and that leaving this infection untreated could result in worsening of his infection, bacteremia, sepsis, and death. He was not willing to stay for IV antibiotics and intended to leave. He was able to verbalize understanding and reiterate in his own words the consequences of leaving against medical advice. Dr. Selena Batten was also notified and agreed that it would not be advisable for him to leave for the above reasons, as he had a serious infection.    Carl Mata signed the paperwork and was awaiting his belongings, leaving against medical advice.     Lianne Cure, MD  [facility] Neurosurgery PGY-3  Service Pager [phone]  Pager [phone]  PI [phone]    I agree with the assessment and plan as outlined in the resident's note.  Report electronically signed by Fernande Bras, MD. Attending

## 2013-04-18 NOTE — Nurse Focus (Signed)
ADMIT NURSING NOTE    Note Started: 04/18/2013, 07:59     Report received from [facility] ED RN.  Patient admitted at 0750 hours to TRU and accompanied by [facility] RN and transport.Pt condition stable. Pt oriented to room and unit.  Admission Assessment and Plan of Care initiated. Arlis Porta, RN.

## 2013-04-18 NOTE — Discharge Summary (Addendum)
DEPARTMENT OF NEUROLOGICAL SURGERY  HOSPITAL DISCHARGE SUMMARY  Note Started: 04/18/2013, 11:47  Admission Date: 04/17/2013  2:28 PM  Discharge Date: 04/18/2013   Admission Diagnosis: Wound infection after surgery [998.59]  Discharge Diagnosis: same    PAST MEDICAL HISTORY:  Past Medical History   Diagnosis Date    Unspecified mental or behavioral problem      bipolar    Unspecified asthma(493.90)        OPERATIONS / PROCEDURES:  None    CONSULTATIONS:  ID    HISTORY OF PRESENT ILLNESS:  37yo male with T12 burst fracture s/p T12 laminectomy, T10-L2 posterior fusion by Dr. Selena Batten on 04/04/13 who presents to the ED with fevers, chills, night sweats, and severe back pain at the site of his surgery. He was doing well until approximately 2 days ago when he woke up in a pool of sweat. He has been having fevers and chills since then. He denies new weakness, paresthesias, urinary or bowel incontinence or other neurologic symptoms. He also denies neck stiffness or altered mental status, and was oriented but was very slow to answer questions in the ED and would fall asleep between questions.    A friend who has been taking care of him says that he is basically homeless and goes from one woman's house to another. Several days ago he took half a bottle of MS contin and was seen at [facility] for overdose. Both the patient and his friend deny recent meth use and deny any hx of IV drug use. The patient reportedly last smoked meth several months ago.    He has a hx trauma in May 2014(fall from [facility] unit 6 feet above ground onto buttocks) with T12 burst fracture, initially treated in a brace, but returned to care with pain, saddle anesthesia, urinary incontinence and MRI showing worsening kyphotic deformity which prompted surigcal intervention earlier this month.    EXAM:  GCS: E4M6V5  Pupils: OU 3mm brisk  Motor: 5/5 in all extremities  Sensory: Intact to LT in all extremities. Stable paresthesias in the gluteal region.    Staples in place. Erythema around the staple line. Tender to palpation. No drainage or purulence.     COURSE:   The patient was admitted and received sepsis protocol with vancomycin, amikacin, and ceftriaxone in the ED. He was started on Vancomycin and Cefepime. Blood cultures were sent. Patient received an MRI which showed no signs of abscess or deep infection. ID was consulted and recommend 1 week of IV antibiotics. At this time the patient made his mind to leave AMA given that he did not wish to stay in the hospital for 7 days. He was instructed on the life threatening nature of infection and the possibility of sepsis and death. He stated complete understanding and chose to leave AMA.     INFECTIONS / COMPLICATIONS:  none    The patient's past medical, family, and social history was reviewed and confirmed.    DISPOSITION:  Patient is stable for discharge to: Home    MEDICATIONS PRESCRIBED for DISCHARGE:  Current Discharge Medication List      CONTINUE these medications which have NOT CHANGED    Details   ALBUTEROL INHA       Cyclobenzaprine (FLEXERIL) 10 mg Tablet Take 1 tablet by mouth every 8 hours.  Qty: 90 tablet, Refills: 0      Diazepam (VALIUM) 5 mg Tablet Take 1 tablet by mouth every 8 hours if needed for spasms.  Qty: 50  tablet, Refills: 0      !! Docusate (COLACE) 100 mg Capsule Take 1 capsule by mouth 2 times daily.  Qty: 60 capsule, Refills: 2      !! Docusate (COLACE) 100 mg Capsule Take 1 capsule by mouth 2 times daily for constipation.  Qty: 50 capsule, Refills: 0      Morphine (MS CONTIN) 30 mg SR 12hr Tablet Take 1 tablet by mouth every 8 hours if needed for pain.  Qty: 100 tablet, Refills: 0      Sennosides (SENOKOT) 8.6 mg Tablet Take 2 tablets by mouth every day at bedtime if needed.  Qty: 50 tablet, Refills: 0       !! - Potential duplicate medications found. Please discuss with provider.          A total of 60 minutes was spent coordinating patient discharge.       Risa Grill   M.D.  Neurosurgery PGY1  Service Pager: [phone]  Personal Pager: [phone]  PI: [id number]       I agree with the assessment and plan as outlined in the resident's note.  Report electronically signed by Fernande Bras, MD. Attending

## 2013-04-18 NOTE — Progress Notes (Addendum)
DEPARTMENT OF NEUROLOGICAL SURGERY  NEUROCRITICAL CARE PROGRESS NOTE      Name: Carl Mata  MRN: [id number]  Date:   04/18/2013 HD#:2      S/P: cellulitis associated with incision      INTERVAL SUMMARY:   MRI spine shows no obvious pocket of fluid or abscess.    IV MEDICATIONS  NaCl 0.9% 1,000 mL with Potassium Chloride 20 mEq, , IV, CONTINUOUS, Last Rate: 100 mL/hr at 04/18/13 0455      SCHEDULED MEDICATIONS  Ceftriaxone (ROCEPHIN) IV 1 g, IV, Q24H Now  Docusate (COLACE) Capsule 100 mg, ORAL, BID  Vancomycin (VANCOCIN) 1,000 mg in D5W 200 mL IVPB, IV, Q12H Now      PRN MEDICATIONS  Acetaminophen (TYLENOL) 650 mg/20.3 mL Solution 650 mg, NG, Q4H PRN  Bisacodyl (DULCOLAX) Suppository 10 mg, RECTALLY, Q24H PRN  Calcium Carbonate (OS-CAL) Tablet 2,500-5,000 mg, ORAL, PRN  Calcium Gluconate 3,000 mg in 0.9% NaCl 100 mL IVPB 3,000 mg, IV, PRN  Magnesium Hydroxide (MILK OF MAGNESIA) 400 mg/5 mL Suspension 30 mL, NG, Q12H PRN  Magnesium Oxide (MAG-OX 400) Tablet 800-1,600 mg, ORAL, PRN  Magnesium Sulfate 2 grams in 50 mL IVPB, IV, PRN  Metoclopramide (REGLAN) Injection 10 mg, IV, Q6H PRN  Morphine Injection 1-4 mg, IV, Q1H PRN  Ocular Lubricant Ophthalmic Ointment 0.25 inch, BOTH Eyes, PRN  Ondansetron (ZOFRAN) Injection 4 mg, IV, Q12H PRN  Potassium Chloride 10 % Liquid 20-60 mEq, ORAL, PRN  Potassium Chloride 10 mEq in Sterile Water IVPB, IV, PRN        Current Vital Signs  Temp: 37 C (98.6 F) (11/10 0400)  Temp src: Oral (11/10 0400)  Pulse: 84 (11/10 0400)  BP: 125/78 mmHg (11/10 0400)  Resp: 11 (11/10 0400)  SpO2: 99 % (11/10 0400)  Height: 172.7 cm (5\' 8" ) (11/09 1800)  Weight: 65 kg (143 lb 4.8 oz) (11/09 1800)    24 Hour Vital Signs  Temp src:  [-]   Temp:  [36.7 C (98 F)-38.8 C (101.8 F)]   Pulse:  [84-109]   BP: (104-135)/(65-80)   Resp:  [8-18]   SpO2:  [95 %-100 %]          Neuro Exam  GCS:  E4M6V5  Pupils:  3mm brisk  Moves all extremities well, 5/5  Incision with erythema, swelling and warmth, no  discharge or palpable fluid colllections    Labs:  CBC   Recent labs for the past 72 hours     04/18/13 0159 04/17/13 1530 04/17/13 1450    WHITE BLOOD CELL COUNT 8.1 10.4 10.6    HEMOGLOBIN 9.7* 9.9* 10.1*    HEMATOCRIT 28.6* 29.7* 29.7*    PLATELET COUNT 469* 473* 466*      CHEM 20 Recent labs for the past 48 hours     04/18/13 0159 04/17/13 1530 04/17/13 1450    SODIUM 135 137 136    POTASSIUM 4.0 4.0 4.1    CHLORIDE 102 102 102    CARBON DIOXIDE TOTAL 28 30 28     CREATININE BLOOD 0.68 0.68 0.72    UREA NITROGEN, BLOOD (BUN) 3* 7* 6*    GLUCOSE 113* 120* 118*    CALCIUM 8.7 8.5* 8.7    PROTEIN -- -- 6.5    ALBUMIN -- -- 2.7*    ALKALINE PHOSPHATASE (ALP) -- -- 94    ASPARTATE TRANSAMINASE (AST) -- -- 30    BILIRUBIN TOTAL -- -- 0.5    ALANINE TRANSFERASE (ALT) -- --  26    TRIGLYCERIDE -- -- --    URIC ACID, BLOOD -- -- --    LACTATE DEHYDROGENASE -- -- --    PHOSPHORUS (PO4) 4.2 3.5 --    CHOLESTEROL -- -- --    MAGNESIUM (MG) 1.8 1.7 --      SURGICAL DIC Recent labs for the past 48 hours     04/18/13 0159 04/17/13 1530 04/17/13 1450    INR 1.09 1.10 1.08    APTT 33.4 32.7 --    FIBRINOGEN -- -- --    D-DIMER -- -- --        Cultures:   All pending.      PLAN:   Neuro   1) Cellulitis at surgical site  - Continue empiric antibiotics, vancomycin and ceftriaxone, day 2/? Touch base w ID to narrow therapy and duration.  - Pain pharmacy consult for pain control in setting of narcotics and drug use.  - Trend ESR/CRP  Pulm  Room air  CV  HDS since admission.  ID  As above.   GI   NPO this AM.   FEN  ESS.   H/H   Goal hct > 21.  DVT  ALPs, hold anticoagulation for now.  LINES  PIVs.     Downgrade to floor status this AM. Discharge planner consult for IV antibiotics.     Harriett Sine, M.D.  Licensed Resident, PGY-5  Neurological Surgery  Personal Pager #[phone]/ Service Pager [phone]    This patient was seen, evaluated, and care plan was developed with the resident.  I agree with the assessment and plan  as outlined in the resident's note.  Report electronically signed by Fernande Bras, MD. Attending

## 2013-04-18 NOTE — ED Nursing Note (Signed)
No acute changes, pt continues to have back pain at surgical site, +csms.

## 2013-04-18 NOTE — ED Nursing Note (Signed)
Assume patient- Night nurse unable to get a hold of T7 charge nurse for patient report.      Patient opens eyes to voice. GCS 15. Reports pain along surgical site- erythema noted along surgical incision. Requesting pain medication. Skin warm and dry.

## 2013-04-18 NOTE — Nurse Focus (Signed)
Echo tech at bedside to complete echocardiogram. Pt refusing echo until he speaks to the doctor. He states "I can't fucking stand this hospital. No one tells me what is going on." Paged Neurosurgery and made aware of pt refusing echo and wanting the speak to a doctor. MD to come to bedside as soon as possible.

## 2013-04-18 NOTE — ED Nursing Note (Signed)
Assumed Pt's care for 1 hour lunch coverage.

## 2013-04-18 NOTE — Nurse Focus (Signed)
Pt wanting to leave AMA. Dr. Sharee Pimple at bedside and discussed that he needs IV antibiotics for his infection and that if he does not receive these antibiotics and leaves his infection will worsen, could result in bacteremia, sepsis and death. Pt verbalizes understanding of this and still wants to leave AMA. Pt's clothes retrieved from the clothing room and left the hospital with his personal belongings including his TLSO brace on at 1210.    Julio Alm, RN.

## 2013-04-18 NOTE — Consults (Signed)
Pain Management Service Pharmacist  Note started: 04/18/2013,09:47      Attempted to see patient but was informed he left AMA.    Elyana Grabski [place], Pharm.D.  Pain Management Pharmacist  Co-Director, Inpatient Pain Service  Pager: [phone]  04/18/2013  13:59

## 2013-04-21 ENCOUNTER — Emergency Department
Admission: EM | Admit: 2013-04-21 | Discharge: 2013-04-21 | Discharge status: Against Medical Advice | Payer: MEDICAID | Attending: Emergency Medicine | Admitting: Emergency Medicine

## 2013-04-21 DIAGNOSIS — M549 Dorsalgia, unspecified: Secondary | ICD-10-CM | POA: Insufficient documentation

## 2013-04-21 NOTE — ED Triage Note (Signed)
Pt was dc'd from hospital on 11/9. Pt had back surgery by Dr Selena Batten 04/04/13. States he has back pain, sob, urinary difficulty. States out of all of his pain meds.  Pt wearing a back brace. Ambulatory into the ER. No neuro defecits noted during assessment. Pt with hx of meth use.

## 2013-04-21 NOTE — ED Nursing Note (Signed)
No Answer" note: The patient was called using the ED PA system. The patient was not found after a search of the ED Waiting Room Areas.

## 2013-04-21 NOTE — ED Progress Note (Signed)
Emergency Department Triage Note     Subjective: Carl Mata is a 38yr old male who presents to the ED with a chief complaint of back pain.     Patient's phone number confirmed - n.     Objective (pertinent exam findings):    BP 121/67  Pulse 109  Temp(Src) 37 C (98.6 F)  Resp 18  Wt 66 kg (145 lb 8.1 oz)  BMI 22.13 kg/m2  SpO2 99%     General: No acute distress, in a brace.    Assessment: Carl Mata is a 37yr old male with back pain, prior back surgery here at [facility].      Plan: Initial studies to evaluate this problem will include imaging and oral analgesia as needed. Further workup will then proceed in patient care area c/d. ?  Seen and initially evaluated by:  Valetta Fuller, MD, Attending Physician, Department of Emergency Medicine       This patient was seen by a physician in triage. Please see the ED Initial Note for full details of this patient's care. This note covers the brief initial evaluation performed to obtain initial studies to expedite the patient's care. It is not intended to be a comprehensive document of the patient's ED visit.    This patient was instructed that this preliminary assessment and any studies obtained do not constitute a full workup of the patient's chief complaint. The patient was instructed to wait in the assigned area after the triage evaluation to be reevaluated by a physician after initial studies are completed.      Electronically signed by: Mabeline Caras. Galvin Proffer, MD Attending Physician  Assistant Professor  Department of Emergency Medicine

## 2013-04-22 ENCOUNTER — Inpatient Hospital Stay
Admission: EM | Admit: 2013-04-22 | Discharge: 2013-05-06 | DRG: 863 | Disposition: A | Discharge status: Home / Self Care | Payer: MEDICAID | Attending: Neurological Surgery | Admitting: Neurological Surgery

## 2013-04-22 DIAGNOSIS — F319 Bipolar disorder, unspecified: Secondary | ICD-10-CM | POA: Diagnosis present

## 2013-04-22 DIAGNOSIS — Z5900 Homelessness unspecified: Secondary | ICD-10-CM | POA: Insufficient documentation

## 2013-04-22 DIAGNOSIS — L02219 Cutaneous abscess of trunk, unspecified: Secondary | ICD-10-CM | POA: Diagnosis present

## 2013-04-22 DIAGNOSIS — Z8781 Personal history of (healed) traumatic fracture: Secondary | ICD-10-CM | POA: Insufficient documentation

## 2013-04-22 DIAGNOSIS — T8140XA Infection following a procedure, unspecified, initial encounter: Principal | ICD-10-CM | POA: Diagnosis present

## 2013-04-22 DIAGNOSIS — Y92009 Unspecified place in unspecified non-institutional (private) residence as the place of occurrence of the external cause: Secondary | ICD-10-CM | POA: Insufficient documentation

## 2013-04-22 DIAGNOSIS — T8149XA Infection following a procedure, other surgical site, initial encounter: Secondary | ICD-10-CM | POA: Insufficient documentation

## 2013-04-22 DIAGNOSIS — Y831 Surgical operation with implant of artificial internal device as the cause of abnormal reaction of the patient, or of later complication, without mention of misadventure at the time of the procedure: Secondary | ICD-10-CM | POA: Diagnosis present

## 2013-04-22 DIAGNOSIS — G8929 Other chronic pain: Secondary | ICD-10-CM | POA: Insufficient documentation

## 2013-04-22 DIAGNOSIS — Z981 Arthrodesis status: Secondary | ICD-10-CM | POA: Insufficient documentation

## 2013-04-22 DIAGNOSIS — J45909 Unspecified asthma, uncomplicated: Secondary | ICD-10-CM | POA: Diagnosis present

## 2013-04-22 DIAGNOSIS — F17200 Nicotine dependence, unspecified, uncomplicated: Secondary | ICD-10-CM | POA: Diagnosis present

## 2013-04-22 MED ORDER — REMOVE LIDOCAINE CREAM
Status: DC | PRN
Start: 2013-04-22 — End: 2013-05-06
  Filled 2013-04-22: qty 1

## 2013-04-22 MED ORDER — CEFEPIME 1 GRAM SOLUTION FOR INJECTION
1.0000 g | Freq: Four times a day (QID) | INTRAMUSCULAR | Status: DC
Start: 2013-04-22 — End: 2013-05-04
  Administered 2013-04-22 – 2013-05-04 (×47): 1 g via INTRAVENOUS
  Filled 2013-04-22 (×48): qty 1000

## 2013-04-22 MED ORDER — BISACODYL 10 MG RECTAL SUPPOSITORY
10.0000 mg | RECTAL | Status: DC | PRN
Start: 2013-04-22 — End: 2013-05-06

## 2013-04-22 MED ORDER — SENNOSIDES 8.6 MG TABLET
2.0000 | ORAL_TABLET | Freq: Two times a day (BID) | ORAL | Status: DC
Start: 2013-04-22 — End: 2013-05-06
  Filled 2013-04-22 (×8): qty 2

## 2013-04-22 MED ORDER — LIDOCAINE 4 % TOPICAL CREAM
TOPICAL_CREAM | TOPICAL | Status: DC | PRN
Start: 2013-04-22 — End: 2013-05-06

## 2013-04-22 MED ORDER — DOCUSATE SODIUM 100 MG CAPSULE
100.0000 mg | ORAL_CAPSULE | Freq: Two times a day (BID) | ORAL | Status: DC
Start: 2013-04-22 — End: 2013-05-06
  Filled 2013-04-22 (×8): qty 1

## 2013-04-22 MED ORDER — MAGNESIUM HYDROXIDE 400 MG/5 ML ORAL SUSPENSION
30.0000 mL | Freq: Two times a day (BID) | ORAL | Status: DC | PRN
Start: 2013-04-22 — End: 2013-05-06

## 2013-04-22 MED ORDER — POTASSIUM CHLORIDE 20 MEQ/L IN 0.9 % SODIUM CHLORIDE INTRAVENOUS
INTRAVENOUS | Status: DC
Start: 2013-04-22 — End: 2013-05-06
  Administered 2013-04-22 – 2013-05-04 (×10): via INTRAVENOUS
  Filled 2013-04-22 (×16): qty 1000

## 2013-04-22 MED ORDER — LIDOCAINE HCL 10 MG/ML (1 %) INJECTION SOLUTION
0.1000 mL | INTRAMUSCULAR | Status: DC | PRN
Start: 2013-04-22 — End: 2013-05-06

## 2013-04-22 MED ORDER — CEFEPIME 1 GRAM SOLUTION FOR INJECTION
1.0000 g | Freq: Three times a day (TID) | INTRAMUSCULAR | Status: DC
Start: 2013-04-22 — End: 2013-04-22
  Administered 2013-04-22: 1 g via INTRAVENOUS
  Filled 2013-04-22: qty 1000

## 2013-04-22 MED ORDER — CYCLOBENZAPRINE 10 MG TABLET
10.0000 mg | ORAL_TABLET | Freq: Three times a day (TID) | ORAL | Status: DC
Start: 2013-04-22 — End: 2013-05-06
  Administered 2013-04-22 – 2013-05-06 (×41): 10 mg via ORAL
  Filled 2013-04-22 (×42): qty 1

## 2013-04-22 MED ORDER — ACETAMINOPHEN 325 MG TABLET
650.0000 mg | ORAL_TABLET | ORAL | Status: DC | PRN
Start: 2013-04-22 — End: 2013-04-30

## 2013-04-22 MED ORDER — DOCUSATE SODIUM 100 MG CAPSULE
100.0000 mg | ORAL_CAPSULE | Freq: Two times a day (BID) | ORAL | Status: DC
Start: 2013-04-22 — End: 2013-04-22

## 2013-04-22 MED ORDER — NACL 0.9% IV BOLUS - DURATION REQ
1000.0000 mL | Freq: Once | INTRAVENOUS | Status: AC
Start: 2013-04-22 — End: 2013-04-22
  Administered 2013-04-22: 1000 mL via INTRAVENOUS

## 2013-04-22 MED ORDER — HEPARIN, PORCINE (PF) 5,000 UNIT/0.5 ML INJECTION SYRINGE
5000.0000 [IU] | INJECTION | Freq: Three times a day (TID) | INTRAMUSCULAR | Status: DC
Start: 2013-04-23 — End: 2013-05-06
  Administered 2013-05-02 – 2013-05-05 (×4): 5000 [IU] via SUBCUTANEOUS
  Filled 2013-04-22 (×14): qty 1

## 2013-04-22 MED ORDER — VANCOMYCIN 1 GRAM/200 ML IN DEXTROSE 5 % INTRAVENOUS PIGGYBACK
1000.0000 mg | INJECTION | Freq: Two times a day (BID) | INTRAVENOUS | Status: DC
Start: 2013-04-22 — End: 2013-04-26
  Administered 2013-04-22 – 2013-04-26 (×8): 1000 mg via INTRAVENOUS
  Filled 2013-04-22 (×8): qty 200

## 2013-04-22 MED ORDER — DIAZEPAM 5 MG TABLET
5.0000 mg | ORAL_TABLET | Freq: Three times a day (TID) | ORAL | Status: DC | PRN
Start: 2013-04-22 — End: 2013-05-06
  Administered 2013-04-22 – 2013-05-06 (×30): 5 mg via ORAL
  Filled 2013-04-22 (×30): qty 1

## 2013-04-22 MED ORDER — HYDROCODONE 10 MG-ACETAMINOPHEN 325 MG TABLET
2.0000 | ORAL_TABLET | ORAL | Status: DC | PRN
Start: 2013-04-22 — End: 2013-04-23
  Administered 2013-04-22 – 2013-04-23 (×6): 2 via ORAL
  Filled 2013-04-22 (×6): qty 2

## 2013-04-22 MED ORDER — MORPHINE 2 MG/ML INJECTION SYRINGE
4.0000 mg | INJECTION | INTRAMUSCULAR | Status: DC | PRN
Start: 2013-04-22 — End: 2013-04-22
  Administered 2013-04-22 (×5): 4 mg via INTRAVENOUS
  Filled 2013-04-22 (×5): qty 1

## 2013-04-22 NOTE — ED Initial Note (Signed)
EMERGENCY DEPARTMENT PHYSICIAN NOTE - [facility] EDMON[facility]       Date of Service:   04/22/2013  7:50 AM Patient's PCP: No Pcp No Pcp   Note Started: 04/22/2013 07:55 DOB: [birth year]             Chief Complaint   Patient presents with    Back Pain Acute     The history provided by the patient.  Interpreter used: No    Carl Mata is a 37yr old male, with a past medical history significant for T12 burst fracture s/p T12 laminectomy, T10-L2 posterior fusion by Dr. Selena Batten on 04/04/13, who presents to the ED with a chief complaint of back pain that began months ago.  Patient left AMA from neurosurgery service on 04/18/13 for possible wound infection.  States he ran out of his MSContin 30mg  tabs that he was prescribed.  Had one episode of urinary incontinence yesterday.  Denies numbness or weakness of the BLE.  Able to ambulate.  No fevers at home.  He has nausea but not vomiting.  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    Quality: ache  Location: lower back  Severity: 10/10  Time Course: constant  Progression: unchanged  Duration: months  Palliative factors: narcotics makes it better.  Provocative factors: movement makes it worse.  Associated symptoms: see HPI  Pertinent negatives: see HPI    A full history, including pertinent past medical, family and social history was reviewed.    HISTORY:  1    *Chronic back pain      Wound infection after surgery     Allergies   Allergen Reactions    Nsaids (Non-Steroidal Anti-Inflammatory Drug) Swelling      Past Medical History:    Unspecified mental or behavioral problem                      Unspecified asthma(493.90)                                 Past Surgical History:    Fusion thoracic posterior                       N/A 04/04/2013    Laminectomy thoracic posterior                  N/A 04/04/2013   Social History    Marital Status: SINGLE              Spouse Name:                       Years of Education:                 Number of children:               Occupational  History    None on file    Social History Main Topics    Smoking Status: Current Every Day Smoker        Packs/Day: 0.50  Years: 15        Types: Cigarettes    Smokeless Status: Not on file                       Alcohol Use: No              Drug Use: Yes  Special: Methamphetamine       Comment: states he used meth >1 yr ago    Sexual Activity: Not on file          Other Topics            Concern    None on file    Social History Narrative    As of 04/07/2013:     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 Reportedly on parole in [place]. Used to work as a Designer, fashion/clothing.    Lives in [place], currently "on the street," has a girlfriend but does not live with her. Doesn't know where he will live next. Says his GF's worker is looking into this.    H/o alcohol dependence (reports being sober for 1 year), + tobacco, + drug use (meth; reports last use >1 year ago).    ---------------------------     Review of patient's family history indicates:    No FH spine problems [Other] *                                    Review of Systems   Constitutional: Negative for fever.   Respiratory: Negative for shortness of breath.    Cardiovascular: Negative for chest pain.   Gastrointestinal: Positive for nausea. Negative for vomiting and abdominal pain.   Musculoskeletal: Positive for back pain.   Neurological: Negative for weakness and numbness.   All other systems reviewed and are negative.        TRIAGE VITAL SIGNS:  Temp: 36.9 C (98.4 F) (04/22/13 0749)  Temp src: Oral (04/22/13 0749)  Pulse: 79 (04/22/13 0749)  BP: 112/57 mmHg (04/22/13 0749)  Resp: 20 (04/22/13 0749)  SpO2: 100 % (04/22/13 0749)  Weight: (not recorded)    Physical Exam   Nursing note and vitals reviewed.  Constitutional: He is oriented to person, place, and time. He appears well-developed and well-nourished.   Patient is distressed 2/2 pain while staff in room, however, when alone appears to be sleeping  comfortably.   HENT:   Head: Normocephalic and atraumatic.   Eyes: EOM are normal. Pupils are equal, round, and reactive to light.   Neck: Normal range of motion. Neck supple.   Cardiovascular: Normal rate, regular rhythm, normal heart sounds and intact distal pulses.    No murmur heard.  Pulmonary/Chest: Effort normal and breath sounds normal. No respiratory distress. He has no wheezes. He has no rales.   Abdominal: Soft. Bowel sounds are normal. He exhibits no distension. There is no tenderness. There is no rebound and no guarding.   Musculoskeletal: Normal range of motion. He exhibits no edema.   Staples in place over surgical incision on midline back, appears to be healing well.  No erythema, edema, warmth, or drainage.   Neurological: He is alert and oriented to person, place, and time.   5/5 bilateral hand grips, 5/5 dorsi and plantar flexion.  Sensation intact in all 4 extremities.     Skin: Skin is warm and dry.   Psychiatric: He has a normal mood and affect. His behavior is normal.     INITIAL ASSESSMENT & PLAN, MEDICAL DECISION MAKING, ED COURSE:  Carl Mata is a 37yr old male who presents with a chief complaint of back pain. After history and exam, I feel the diagnosis is most likely drug seeking behavior. Differential diagnosis includes,  but is not limited to wound infection, cauda equina.  The patient is hemodynamically stable and will require monitoring as an immediate intervention. Initial treatment and studies to evaluate this problem will include labs and neurosurgical consult.     ED Course Update: The patient was observed in the ED. The results of the ED evaluation were notable for the following:    CBC - mild anemia  BMP wnl  CRP 3.1    Chart Review: I reviewed the patient's prior medical records. Pertinent information that is relevant to this encounter includes Neurosurgery discharge summaries, and Rx for MS Contin SR 30mg  #100 written on 04/11/13 .    [facility] report reveals 30 scripts since  March for narcotic medications filled by various physicians.    Further MDM  Additional data reviewed? N/A    ED Course/Patient Summary: Overall rather low suspicion for acute spinal cord compression or infectious process. Presentation is somewhat covoluated with likely some drug seeking behavior and underlying psych d/o. Regardless, given his prior history neurosurgery consulted for back pain with recent surgery on 10/27.  They examined patient and recommend admission for further workup.  Will be receiving abx.      LAST VITAL SIGNS:  Temp: 36.8 C (98.2 F) (04/22/13 0955)  Temp src: Oral (04/22/13 0955)  Pulse: 81 (04/22/13 0955)  BP: 116/62 mmHg (04/22/13 0955)  Resp: 16 (04/22/13 0955)  SpO2: 100 % (04/22/13 0955)  Weight: (not recorded)      Clinical Impression:   Back pain    Anticipated work up needed: NSG final recs      Disposition: Admit. Anticipate the patient will require greater than 2 nights of admission due to possible wound infection.    MDM COMPLEXITY  Overall Complexity of MDM is: moderate      PRESENT ON ADMISSION:  Are any of the following four conditions present or suspected on admission: decubitus ulcer, infection from an intravascular device, infection due to an indwelling catheter, surgical site infection or pneumonia? Possible surgical site infection    PATIENT'S GENERAL CONDITION:  Good: Vital signs are stable and within normal limits. Patient is conscious and comfortable. Indicators are excellent.       Report electronically signed by:  Allyn Kenner, MD Resident      This patient was seen, evaluated, and care plan was developed with the resident.  I agree with the findings and plan as outlined in our combined note.      Valetta Fuller, MD      Electronically signed by: Mabeline Caras. Galvin Proffer, MD Attending Physician  Assistant Professor  Department of Emergency Medicine

## 2013-04-22 NOTE — ED Nursing Note (Signed)
Pt ambulated with steady upright gait to the bathroom

## 2013-04-22 NOTE — ED Nursing Note (Signed)
Pt laying in bed with eyes closed, respirations even and unlabored, no signs of distress, report given to [place], [place]

## 2013-04-22 NOTE — ED Nursing Note (Signed)
Neurosurg at bedside

## 2013-04-22 NOTE — ED Nursing Note (Addendum)
Pt very upset that IV pain meds discontinued. Pt verbally abusive to nurse, pt redirected. Will offer PO meds. Pt continues to be non compliant with ECG leads

## 2013-04-22 NOTE — ED Nursing Note (Signed)
Vancomycin completed.  Pt medicated for pain per order, see MAR.  Pt moved to pod A, report to RN

## 2013-04-22 NOTE — ED Nursing Note (Signed)
MD returned paged, upgraded diet to regular and discontinued IV MS, MD desires all meds to be given PO not IV

## 2013-04-22 NOTE — Nurse Focus (Signed)
Informed pt that plan of care is no IV pain med. Visitor in to see pt. Pt/ visitor conversing.

## 2013-04-22 NOTE — ED Nursing Note (Signed)
Pt uncooperative with ECG monitoring, pt states he will not wear them. Will continue to encourage compliance

## 2013-04-22 NOTE — ED Nursing Note (Signed)
Report received and care assumed for one hour lunch relief. Pt sleeping awakens to verbal stimuli, gcs 15. Pt states pain decreased now.  IV antibiotics infusing per order.  Will continue to monitor.

## 2013-04-22 NOTE — Consults (Addendum)
INFECTIOUS DISEASES CONSULTATION   Consulting Service: Neurosurgery  Consulting Physician: Dr. Donita Brooks        Reason for consultation: Assessment of wound and antibiotic therapy    History of Present Illness: 37yr old male s/p T10-L2 Poster fusion and T12 Laminectomy 10/27 admitted for back pain at site of incision concerning for infection. Patient was admitted approximately one week ago after waking in a pool of sweat and experiencing subsequent fevers, chills, and back pain. He left AMA on 04/18/13 has not taken antibiotics since then. Today, he notes that the pain is in his R lower back and has continued since his last departure. It is dull and constant, preventing him from sleeping and finding a comfortable position. He endorses chills but denies fevers and sweats. He says he has no numbness, tingling, falls, weakness, or bowel incontinence. Two days before this presentation, he flopped hard backward onto a bus seat and hit his back on the rear railing, aggravating the pain. He denies any other trauma. One day before presentation, he had one episode of urinary incontinence in which he noticed dribbling while sitting on a rail outside.  Having depleted his MS Contin prescription, he returned to the ED in unbearable pain this morning. He has a hard time seeing his incision site, but he states that neither he nor his fiancee have noticed any drainage from the site nor rashes anywhere.       Review Of Systems:   Constitutional: no fever, chills, weakness, night sweats or weight loss  CV: no chest pain, palpitations, or LE edema  Resp: no cough, shortness of breath, orthopnea or dyspnea on exertion   GI: no abdominal pain, positive for nausea, no vomiting or diarrhea / constipation  GU: no dysuria or frequency  Integumentary: no rash or skin lesions  Neuro: no headache, focal weakness, numbness or gait instability  Lymph: no swollen nodes      Current Inpatient Medications    Scheduled Medications  Cefepime  (MAXIPIME) IV 1 g, IV, Q8H Now  Cyclobenzaprine (FLEXERIL) Tablet 10 mg, ORAL, Q8H  Docusate (COLACE) Capsule 100 mg, ORAL, BID  Sennosides (SENOKOT) Tablet 17.2 mg, ORAL, BID  Vancomycin (VANCOCIN) 1,000 mg in D5W 200 mL IVPB, IV, Q12H Now      IV Medications  NaCl 0.9% 1,000 mL with Potassium Chloride 20 mEq, , IV, CONTINUOUS, Last Rate: 100 mL/hr at 04/22/13 1200      PRN Medications  Acetaminophen (TYLENOL) Tablet 650 mg, ORAL, Q4H PRN  Bisacodyl (DULCOLAX) Suppository 10 mg, RECTALLY, Q24H PRN  Diazepam (VALIUM) Tablet 5 mg, ORAL, Q8H PRN  Hydrocodone 10 mg/Acetaminophen 325 mg (NORCO 10) Tablet 2 tablet, ORAL, Q4H PRN  Magnesium Hydroxide (MILK OF MAGNESIA) 400 mg/5 mL Suspension 30 mL, ORAL, Q12H PRN  Morphine Injection 4 mg, IV, Q1H PRN        Allergies:   Allergies   Allergen Reactions    Nsaids (Non-Steroidal Anti-Inflammatory Drug) Swelling       Past Medical History  Patient Active Problem List   Diagnosis    Right medial knee pain    Abdominal pain    Cough    Fall    Contusion    Back sprain    Elbow contusion    Back pain    Stable burst fx of  T12 fracture    Chronic back pain    Wound infection after surgery       Past Surgical History   Procedure Laterality Date  Fusion thoracic posterior N/A 04/04/2013     Procedure: FUSION THORACIC POSTERIOR;  Surgeon: Fernande Bras, MD;  [facility];  Service: Neurosurgery    Laminectomy thoracic posterior N/A 04/04/2013     Procedure: LAMINECTOMY THORACIC POSTERIOR;  Surgeon: Fernande Bras, MD;  [facility];  Service: Neurosurgery       Family History   Problem Relation Age of Onset    No FH spine problems [Other] [OTHER]     Mother had heart disease    History     Social History    Marital Status: SINGLE     Spouse Name: N/A     Number of Children: N/A    Years of Education: N/A     Social History Main Topics    Smoking status: Current Every Day Smoker -- 0.50 packs/day for 15 years     Types: Cigarettes    Smokeless tobacco:  Not on file    Alcohol Use: No    Drug Use: Yes     Special: Methamphetamine      Comment: states he used meth >1 yr ago    Sexual Activity: Not on file     Other Topics Concern    Not on file     Social History Narrative    As of 04/07/2013:     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 Reportedly on parole in [place]. Used to work as a Designer, fashion/clothing.    Lives in [place], currently "on the street," has a girlfriend but does not live with her. Doesn't know where he will live next. Says his GF's worker is looking into this.    H/o alcohol dependence (reports being sober for 1 year), + tobacco, + drug use (meth; reports last use >1 year ago).    ---------------------------       Social: 1ppd tobacco for 24 years, denies alcohol and IV drug use; last meth use last week  Living environment: sober living facility in [place]  Travel: born in [place], raised in [place]; no recent travel     All- NSAIDS    Vital Signs Summary (past 24 hours)  Temp Min: 36.8 C (98.2 F) Max: 37 C (98.6 F)  Systolic (24hrs), Avg:120 mmHg, Min:112 mmHg, Max:135 mmHg  Diastolic (24hrs), Avg:62 mmHg, Min:57 mmHg, Max:68 mmHg  Pulse Min: 79 Max: 86  Resp Min: 16 Max: 20  SpO2 Min: 97 % Max: 100 %  No Data Recorded     Intake and Output: Last Two Completed Shifts:       Current Vitals (last recorded)  Temp: 36.9 C (98.4 F)  BP: 135/68 mmHg Pulse: 86  Resp: 16  SpO2: 97 %         There is no height or weight on file to calculate BSA.  There is no weight on file to calculate BMI.    Physical Exam  General Appearance: thin male, lying curled in bed, in NAD  Eyes:  conjunctivae and corneas clear. PERRL, EOM's intact. sclerae normal.  Mouth: normal.  Neck:  Neck supple. No adenopathy  Heart:  normal rate and regular rhythm, no murmurs, clicks, or gallops.  Lungs: clear to auscultation.  Abdomen: BS normal.  Abdomen soft, non-tender.  No masses or organomegaly.  Extremities:  no cyanosis, clubbing,  or edema and distal pulses normal.  Skin:  No rashes. Numerous tattoos on arms, feet, trunk, and neck. Midline incision site  on T spine tender to palpation; erythematous and with some swelling and induration along staple line; no drainage; cervical, lumbar, sacral spinous processes and paraspinal muscles not ttp  Mental Status: aaox3     Labs / Studies   CBC   Recent labs for the past 72 hours     04/22/13 0944    WHITE BLOOD CELL COUNT 9.0    HEMOGLOBIN 12.1*    HEMATOCRIT 34.6*    PLATELET COUNT 713*      LAST CHEM 20   Recent labs for the past 72 hours     04/22/13 1059    SODIUM 142    POTASSIUM 4.0    CHLORIDE 108    CARBON DIOXIDE TOTAL 25    CREATININE BLOOD 0.79    UREA NITROGEN, BLOOD (BUN) 10    GLUCOSE 80    CALCIUM 9.3    PROTEIN --    ALBUMIN --    ALKALINE PHOSPHATASE (ALP) --    ASPARTATE TRANSAMINASE (AST) --    BILIRUBIN TOTAL --    ALANINE TRANSFERASE (ALT) --    TRIGLYCERIDE --    URIC ACID, BLOOD --    LACTATE DEHYDROGENASE --    PHOSPHORUS (PO4) 4.1    CHOLESTEROL --        Micro   Blood Cultures x2 11/9  CULTURE BLOOD:      CULTURE BLOOD Preliminary                                   NO GROWTH TO DATE          Imaging       MRI L Spine w and wo contrast 04/17/13  IMPRESSION:    NO OBVIOUS POSTOPERATIVE COMPLICATION    NOTE THE CENTRAL CANAL IS NOT WELL EVALUATED AT LEVEL OF T12 VERTEBRAL BODY  DUE TO ARTIFACT FROM HARDWARE.    BILATERAL AIRSPACE OPACITIES RIGHT GREATER THAN LEFT COULD REPRESENT  PNEUMONIA         Assessment / Recommendations:  37yr old male s/p T10-L2 Poster fusion and T12 Laminectomy 10/27 for T12 burst fracture who presents with cellulitis over the surgical site.     #Cellulitis: On exam, patient has induration, tenderness and erythema without drainage or history thereof along his incision site. VSS and wnl with no leukocytosis. Imaging shows no deep tissue infection. For localized, superficial infection, most common  organisms include strep and staph aureus. Will likely need PICC placement for continued IV antibiotic therapy.    -recommend changing Cefepime to 1g q 6 hours  -continue Vanc 1g q 12 hours  The above plan was discussed in detail and formulated with the attending, Dr. Janee Morn.      Thank you for allowing [place] to participate in the care of this patient.  Please contact us if there are any questions.  We will continue to follow with you.    Consuello Bossier, MD  PGY1  Internal Medicine  [facility]  PI# [phone]  Pager [phone]    This patient was seen, evaluated, and care plan was developed with the resident. I agree with the findings and plan as outlined in the note above.    Willeen Cass MD  Associate Professor of Clinical Medicine  Department of Medicine - Division of Infectious Diseases  Department of Medical Microbiology and Immunology  Pager: [phone]    This patient was seen, evaluated, and  care plan was developed with the resident. I agree with the findings and plan as outlined in the note above.    Francee Piccolo MD  Associate Professor of Clinical Medicine  Department of [place] of Infectious Diseases  Department of Medical Microbiology and Immunology  Pager: [phone]

## 2013-04-22 NOTE — Consults (Addendum)
DEPARTMENT OF NEUROLOGICAL SURGERY  GENERAL CONSULTATION    Consulting Service:     Date of Admission:   04/22/2013  7:50 AM Date of Service: 04/22/2013   Note Started: 04/22/2013, 09:53 Name of Requesting Attending: ED         REASON FOR CONSULTATION:  Left Lower Back Pain     HISTORY OF PRESENT ILLNESS:  Carl Mata is a 37yr old male s/p T10-L2 Poster fusion and T12 Laminectomy by Dr. Donita Brooks on 10/27 who was recently admitted ~1 week ago for incisional pain and concerns of wound infection (leaving AMA on 04/18/13 who returns to the ED after running out of MS Contin and has been having left lower pain pain and 1 bout of urinary incontinence yesterday. He denies numbness or weakness.He denies fevers, chills, or night sweats. Denies bowel dysfunction.       HISTORY:  Patient Active Problem List    Diagnosis Date Noted    Wound infection after surgery 04/17/2013    Stable burst fx of  T12 fracture 04/09/2013     Overview Note:     Patient sustained a stable T12 burst fracture and was seen and examined by the neurosurgery spine service who do not recommend surgical intervention for this type of fracture. It was recommended that he be fitted for a TLSO back brace which is to be worn while sitting upright and standing at all times for 4-6 weeks. His pain is controlled with PO narcotics and flexeril. He is to follow up with the neurosurgery spine clinic in 4 weeks or sooner if he is experiencing worsening of symptoms.       Chronic back pain 04/04/2013    Back pain 12/23/2012    Fall 12/07/2012     Overview Note:     C-spine cleared.       Elbow contusion 11/28/2012    Contusion 10/31/2012    Back sprain 10/31/2012    Cough 10/06/2012    Abdominal pain 09/24/2012    Right medial knee pain 09/20/2012    Allergies   Allergen Reactions    Nsaids (Non-Steroidal Anti-Inflammatory Drug) Swelling      Past Medical History   Diagnosis Date    Unspecified mental or behavioral problem      bipolar     Unspecified asthma(493.90)      Past Surgical History   Procedure Laterality Date    Fusion thoracic posterior N/A 04/04/2013     Procedure: FUSION THORACIC POSTERIOR;  Surgeon: Fernande Bras, MD;  [facility];  Service: Neurosurgery    Laminectomy thoracic posterior N/A 04/04/2013     Procedure: LAMINECTOMY THORACIC POSTERIOR;  Surgeon: Fernande Bras, MD;  [facility];  Service: Neurosurgery      (Not in a hospital admission)   Social History     Occupational History    Not on file.     Social History Main Topics    Smoking status: Current Every Day Smoker -- 0.50 packs/day for 15 years     Types: Cigarettes    Smokeless tobacco: Not on file    Alcohol Use: No    Drug Use: Yes     Special: Methamphetamine      Comment: states he used meth >1 yr ago    Sexual Activity: Not on file    Family History   Problem Relation Age of Onset    No FH spine problems [Other] [OTHER]  There is no immunization history on file for this patient.    The patient's past medical, family, and social history was reviewed and confirmed.    ROS:  All other systems negative except as noted in the HPI.    VITAL SIGNS:  Vital Signs (Last Recorded):  Temp: 36.8 C (98.2 F) (04/22/13 0955)  Temp src: Oral  Pulse: 80  BP: 116/62 mmHg  Resp: 18  SpO2: 100 %        There is no height or weight on file to calculate BSA.  There is no weight on file to calculate BMI.    PHYSICAL EXAM:  General Appearance: Patient was walking when examiner entered room, however became tearful when examined.   Neuro Exam: Tender to palpation around the right border of the incision. Also tender in the left lower dorsal quadrant. Staples intact, Erythema around stable line. Incision intact.   GCS: Eyes: 4 Verbal: 5 Motor: 6  Pupils: OD size: 3 mm, reaction: brisk             OS size: 3 mm, reaction: brisk  Cranial Nerves: 2-12 intact     Motor:    Deltoid Biceps Triceps Wrist Flexion Wrist Extension Hand Intrinsics   Right 5 5 5 5 5 5    Left 5  5 5 5 5 5       Hip Flexion Knee Extension Knee Flexion Dorsiflexion Great Toe Extension Plantar Flexion   Right 5 5 5 5 5 5    Left 5 5 5 5 5 5      Reflexes:   Biceps Triceps Brachio-radialis Patellar Achilles Plantar Flexor Response   Right 1 1 1 3  3+ down   Left 1 1 1 2 2  down               Rectal: not examined          Hoffman-Troemnor: absent  Sensory: Stable paresthesias in the lateral thighs and legs since post op.       LAB TESTS / STUDIES:  I personally reviewed the following:      Latest CMP    No results found for this basename: NA,K,CL,CO2,BUN,CR,GFRAA,GFRNAA,GLU,[place],PT,ALB,ALP,ASTTBIL,ALT in the last 48 hours  Latest CBC  Recent labs for the past 48 hours     04/22/13 0944    WHITE BLOOD CELL COUNT 9.0    HEMOGLOBIN 12.1*    HEMATOCRIT 34.6*    PLATELET COUNT 713*     Latest INR / APTT    No results found for this basename: INR,APTT in the last 48 hours      ASSESSMENT: Carl Mata is a 37yr old male s/p T10-L2 Poster fusion and T12 Laminectomy by Dr. Donita Brooks on 10/27 who was recently admitted ~1 week ago for incisional pain and concerns of wound infection (leaving AMA on 04/18/13 who returns to the ED after running out of MS Contin and has been having left lower pain pain and 1 bout of urinary incontinence yesterday. Patient exam is stable, however his wound is concerning for superficial infection, with lower left back pain concerning for spreading infectious process.     RECOMMENDATIONS:   - Admit to neurosurgery   - Patient discussed with Dr. Donita Brooks  - ID consulted   - IV Vancomycin and Cefepime started pending ID recommendations.     Risa Grill  M.D.  Neurosurgery PGY1  Service Pager: [phone]  Personal Pager: [phone]  PI: #[id number]    This patient was seen, evaluated,  and care plan was developed with the resident.  I agree with the assessment and plan as outlined in the resident's note.  Pain control inadequate per patient.  Will review meds and consult pain pharm.    Report electronically signed  by Fernande Bras, MD. Attending

## 2013-04-22 NOTE — ED Nursing Note (Signed)
Pt complaining of pain, requesting more pain meds, pt informed meds are not available until 2030. Pt verbally upset demanding to speak with MD.

## 2013-04-22 NOTE — ED Nursing Note (Signed)
Pt dislikes diabetic diet requests regular diet d/t no DM diagnosis, paged MD

## 2013-04-22 NOTE — ED Nursing Note (Signed)
Patient admitted to D14. Documentation updated and complete. Patient report communicated via Vocera. Patient will be transported via gurney by ED Tech. Patient belongings kept by patient.

## 2013-04-22 NOTE — ED Nursing Note (Signed)
Pt to room D5 via EMS. Pt very dramatic and writhing/crying in pain on gurney. Pt reports had back surgery 1 week ago, pt unable to verblize where surgery happened. States yesterday ran out of pain meds and since then pain has been unbearable. When asked if pt has follow up with surgeons, pt states "I don't know" IV established, awaiting MD eval. Pt able to ambulate from EMS gurney to hospital gurney without difficulty.

## 2013-04-22 NOTE — ED Triage Note (Signed)
Pt a/ox 4, biba with c/o back pain. Pt with recent back surgery.  Ran out of pain meds yesterday.  SL with staples to ML back, redness noted, no drainage.  To room.

## 2013-04-22 NOTE — ED Nursing Note (Signed)
Report taken from outgoing RN, pt asleep when approached. Wakes up complaining of excoriating back pain; spoke with MD states will review pt's chart. Awaiting for bed in floor.

## 2013-04-22 NOTE — Progress Notes (Addendum)
DEPARTMENT OF NEUROLOGICAL SURGERY  WARD:  CRANIAL PROGRESS NOTE  04/23/2013, 21:13    Date of service: 04/23/2013    LOS:  1 days   POD: 19  S/P: T10-L2 Poster fusion and T12 Laminectomy     INTERVAL SUMMARY:     - Oral Pain Medications  - ID recommendations implemented. Vanc and Cefepime day 2/?.   - PICC pending       SUBJECTIVE:      OBJECTIVE:     Medications  Scheduled Medications  Cefepime (MAXIPIME) IV 1 g, IV, Q6H Now  Cyclobenzaprine (FLEXERIL) Tablet 10 mg, ORAL, Q8H  Docusate (COLACE) Capsule 100 mg, ORAL, BID  Heparin PF 5,000 units/0.5 mL Injection 5,000 Units, SUBCUTANEOUS, Q8H  Sennosides (SENOKOT) Tablet 17.2 mg, ORAL, BID  Vancomycin (VANCOCIN) 1,000 mg in D5W 200 mL IVPB, IV, Q12H Now, Last Rate: 0 mg (04/22/13 1405)    Continuous Medications  NaCl 0.9% 1,000 mL with Potassium Chloride 20 mEq, , IV, CONTINUOUS, Last Rate: 100 mL/hr at 04/23/13 0706      PRN Medications  Acetaminophen (TYLENOL) Tablet 650 mg, ORAL, Q4H PRN  Bisacodyl (DULCOLAX) Suppository 10 mg, RECTALLY, Q24H PRN  Diazepam (VALIUM) Tablet 5 mg, ORAL, Q8H PRN  Hydrocodone 10 mg/Acetaminophen 325 mg (NORCO 10) Tablet 2 tablet, ORAL, Q4H PRN  Lidocaine (L-M-X4) 4 % Cream, TOPICAL-site required, PRN  Lidocaine (XYLOCAINE) 10 mg/mL (1%) Injection 0.1-2 mL, INTRADERMAL, PRN  Lidocaine cream REMOVAL, TOPICAL-site required, PRN  Magnesium Hydroxide (MILK OF MAGNESIA) 400 mg/5 mL Suspension 30 mL, ORAL, Q12H PRN        Current Vital Signs  BP 91/51  Pulse 59  Temp(Src) 36.7 C (98 F) (Oral)  Resp 16  Ht 1.727 m (5\' 8" )  Wt 63.4 kg (139 lb 12.4 oz)  BMI 21.26 kg/m2  SpO2 99%    24 Hour Vital Signs    T, P, BP, ABP  Patient Vitals for the past 24 hrs:   Temp   04/22/13 2051 37.1 C (98.7 F)   04/22/13 1916 36.2 C (97.1 F)   04/22/13 1155 36.9 C (98.4 F)   04/22/13 0955 36.8 C (98.2 F)   04/22/13 0800 37 C (98.6 F)   04/22/13 0749 36.9 C (98.4 F)      Patient Vitals for the past 24 hrs:   Pulse   04/22/13 2051 98    04/22/13 1916 84   04/22/13 1355 80   04/22/13 1155 86   04/22/13 0955 81   04/22/13 0800 80   04/22/13 0749 79      Patient Vitals for the past 24 hrs:   BP   04/22/13 2051 112/55 mmHg   04/22/13 1916 102/50 mmHg   04/22/13 1355 121/73 mmHg   04/22/13 1155 135/68 mmHg   04/22/13 0955 116/62 mmHg   04/22/13 0800 116/62 mmHg   04/22/13 0749 112/57 mmHg      No data found.       Resp, RR (vent), SpO2, CVP mean  Patient Vitals for the past 24 hrs:   Resp   04/22/13 2051 18   04/22/13 1916 16   04/22/13 0955 16   04/22/13 0800 18   04/22/13 0749 20      No data found.   Patient Vitals for the past 24 hrs:   SpO2   04/22/13 2051 99 %   04/22/13 1916 100 %   04/22/13 1355 100 %   04/22/13 1155 97 %   04/22/13 0955 100 %  04/22/13 0800 100 %   04/22/13 0749 100 %      No data found.       Pain Scale, Pain Score, Pain Assessment  Patient Vitals for the past 24 hrs:   Pain Scale Self-Report   04/22/13 2051 Numeric (NRS) (0-10)   04/22/13 2008 Numeric (NRS) (0-10)   04/22/13 1916 Numeric (NRS) (0-10)   04/22/13 1626 Numeric (NRS) (0-10)   04/22/13 1355 Numeric (NRS) (0-10)   04/22/13 1300 Numeric (NRS) (0-10)   04/22/13 1155 Numeric (NRS) (0-10)   04/22/13 1130 Numeric (NRS) (0-10)   04/22/13 1036 Numeric (NRS) (0-10)   04/22/13 0800 Numeric (NRS) (0-10)   04/22/13 0749 Numeric (NRS) (0-10)      Patient Vitals for the past 24 hrs:   Pain Score Self-Report (0-10)   04/22/13 2051 10   04/22/13 2008 10   04/22/13 1916 10   04/22/13 1626 7   04/22/13 1355 8   04/22/13 1300 7   04/22/13 1155 7   04/22/13 1130 6   04/22/13 1036 10   04/22/13 0800 8   04/22/13 0749 8      Patient Vitals for the past 24 hrs:   Pain assessment   04/22/13 2051 Nods yes to pain   04/22/13 2008 Nods yes to pain   04/22/13 1916 Nods yes to pain   04/22/13 1626 Nods yes to pain   04/22/13 1355 Nods yes to pain   04/22/13 1300 Nods yes to pain   04/22/13 1155 Nods yes to pain   04/22/13 0800 Nods yes to pain   04/22/13 0749 Nods yes to pain               I/O Last 2 Completed Shifts:  In: 2491.7 [Oral:1000; Crystalloid:1491.7]  Out: 1200 [Urine:1200]    I/O Current Shift:  In: 600 [Oral:600]  Out: 450 [Urine:450]    PHYSICAL EXAM:General Appearance: Patient was walking when examiner entered room, however became tearful when examined.   Neuro Exam: Tender to palpation around the right border of the incision. Also tender in the left lower dorsal quadrant. Staples intact, Erythema around stable line. Incision intact.   GCS: Eyes: 4 Verbal: 5 Motor: 6  Pupils: OD size: 3 mm, reaction: brisk   OS size: 3 mm, reaction: brisk  Cranial Nerves: 2-12 intact     Motor:    Deltoid Biceps Triceps Wrist Flexion Wrist Extension Hand Intrinsics   Right 5 5 5 5 5 5    Left 5 5 5 5 5 5       Hip Flexion Knee Extension Knee Flexion Dorsiflexion Great Toe Extension Plantar Flexion   Right 5 5 5 5 5 5    Left 5 5 5 5 5 5      Reflexes:   Biceps Triceps Brachio-radialis Patellar Achilles Plantar Flexor Response   Right 1 1 1 3  3+ down   Left 1 1 1 2 2  down     Sensory: Stable paresthesias in the lateral thighs and legs since post op.       Wound: Staples in place, erythema surrounding the wound. TTP on the right aspect of wound.       Labs:  Latest Chem--7 with Mg  Recent labs for the past 48 hours     04/22/13 1059    SODIUM 142    POTASSIUM 4.0    CHLORIDE 108    CARBON DIOXIDE TOTAL 25    UREA NITROGEN, BLOOD (BUN) 10  CREATININE BLOOD 0.79    GLUCOSE 80    MAGNESIUM (MG) 2.1     Latest CBC  Recent labs for the past 48 hours     04/22/13 1209    WHITE BLOOD CELL COUNT 8.7    HEMOGLOBIN 11.5*    HEMATOCRIT 33.7*    PLATELET COUNT 660*     Latest INR / APTT    No results found for this basename: INR,APTT in the last 48 hours  ABG Last 24 hours   No results found for this basename: ARTPO2:*,ARTO2SAT:*,ARTPCO2:*,ARTPH:*,ARTHCO3:*,ARTBE:* in the last 24 hours    PLAN:   Neuro: S/P superficial wound  infection  - Pain control on oral medications  - Vanc + Cefepime per ID day 2/?  - PICC pending  - Serial neuroexams  Pulmonary: Stable on RA  CV: Stable  ID: Possible wound infection, on Abx as above per ID.   F/E/N: NS 149ml/hr  DVT: SCDs, SQH    Risa Grill  M.D.  Neurosurgery PGY1  Service Pager: [phone]  Personal Pager: [phone]  PI: #[id number]    This patient was seen, evaluated, and care plan was developed with the resident.  I agree with the assessment and plan as outlined in the resident's note.  Report electronically signed by Fernande Bras, MD. Attending

## 2013-04-22 NOTE — Nurse Assessment (Signed)
ADMIT NURSING NOTE    Note Started: 04/22/2013, 20:59     Report received from ED. Patient admitted at 2030 hours from the Emergency Department and accompanied by Transport. Pt condition stable . patient oriented to room and unit.Admission Assessment and Plan of Care initiated. Simonne Maffucci, RN RN    Pt is alert and conversant, states he needs IV medication for pain as oral medication given at ED @ 2000 not helping.

## 2013-04-22 NOTE — Nurse Focus (Signed)
Conferred with on-call MD by phone to relay pt's request for IV pain med, per MD, plan of care is not to give IV pain med.

## 2013-04-23 ENCOUNTER — Inpatient Hospital Stay: Payer: MEDICAID

## 2013-04-23 ENCOUNTER — Inpatient Hospital Stay (HOSPITAL_COMMUNITY): Payer: MEDICAID

## 2013-04-23 MED ORDER — NICOTINE 21 MG/24 HR DAILY TRANSDERMAL PATCH
1.0000 | MEDICATED_PATCH | TRANSDERMAL | Status: DC
Start: 2013-04-24 — End: 2013-05-06
  Administered 2013-04-23: 1 via TRANSDERMAL
  Filled 2013-04-23 (×3): qty 1

## 2013-04-23 MED ORDER — REMOVE NICOTINE PATCH
1.0000 | Status: DC
Start: 2013-04-25 — End: 2013-05-06
  Administered 2013-04-25: 1 via TRANSDERMAL

## 2013-04-23 MED ORDER — OXYCODONE 5 MG TABLET
10.0000 mg | ORAL_TABLET | ORAL | Status: DC | PRN
Start: 2013-04-23 — End: 2013-05-06
  Administered 2013-04-23 – 2013-04-27 (×20): 20 mg via ORAL
  Administered 2013-04-27: 10 mg via ORAL
  Administered 2013-04-27 – 2013-05-06 (×50): 20 mg via ORAL
  Filled 2013-04-23 (×71): qty 4

## 2013-04-23 MED ORDER — LIDOCAINE 4 % TOPICAL CREAM
TOPICAL_CREAM | TOPICAL | Status: DC | PRN
Start: 2013-04-23 — End: 2013-04-23

## 2013-04-23 MED ORDER — LIDOCAINE HCL 10 MG/ML (1 %) INJECTION SOLUTION
0.1000 mL | INTRAMUSCULAR | Status: DC | PRN
Start: 2013-04-23 — End: 2013-04-23
  Administered 2013-04-23: 0.2 mL via INTRADERMAL

## 2013-04-23 MED ORDER — ACETAMINOPHEN 500 MG TABLET
1000.0000 mg | ORAL_TABLET | Freq: Four times a day (QID) | ORAL | Status: DC
Start: 2013-04-23 — End: 2013-05-06
  Administered 2013-04-23 – 2013-05-06 (×37): 1000 mg via ORAL
  Filled 2013-04-23 (×48): qty 2

## 2013-04-23 MED ORDER — REMOVE LIDOCAINE CREAM
Status: DC | PRN
Start: 2013-04-23 — End: 2013-04-23
  Filled 2013-04-23: qty 1

## 2013-04-23 NOTE — Procedures (Signed)
MRN [id number]  Carl Mata  DOB 16-Apr-1976      PICC PROCEDURE NOTE  Note started: 04/23/2013  12:13        * Date of service: 04/23/2013     LOS:  LOS: 1 day      Pre Procedure Diagnosis: Inpatient: Chronic back pain [724.5, 338.29]  Wound infection after surgery [998.59]  Post Procedure Diagnosis: same  * Patient's specific hospital location during procedure: D14    * Occupation of inserter: RN  Ordering MD: Annett Gula  Service: med/surg  PI #: [phone]  Pager: 1    Was inserter a member of PICC Team yes  Reason for insertion  Indication: New indication   PICC Use: IV ABX    Appropriate procedural pause was taken.  See pre-procedure checklist for additional documentation.    Inserter performed hand hygiene prior to central line insertion: yes    Maximal sterile barriers used:  Skin Prep:  * Sterility: Cap, , Drape,, Gloves,, Gown, and Mask,  * Skin Prep: Chlorhexidine Anti-Septic Pre-Wash, Alcohol Swab and Chlorhexidine Sterile Prep  Was skin prep completely dry at time of first skin puncture yes    Modified Seldinger insertion technique was utilized. Ultrasound guidance was utilized to document selected vessel patency and concurrent ultrasound visualization of vascular needle entry into venous lumen. This vein chosen because vein measures 5.6 mm in diameter.   * Insertion site: right basilic vein, 10 CM above the ACF    Measurements:  Internal length (IL): 41 CM  External length (EL): 4 CM  Mid-arm circumference (MAC): 26 CM measured @ 10 CM above ACF.   Mid-arm circumference (MAC): 25 CM measured @ 10 CM below ACF.    PICC Catheter:  Type of PICC: Solo Power PICC     * Lumen: Double Lumen    * Size: 5 FR  Lot #: [id number]  Vein accessed using: 5 FR Micro Introducer with 20 Gauge IV catheter  Blood return: yes  Saline lock: yes  Heparin lock: no    Post Procedure:  Estimated blood loss: minimal  Complications: None  Comments: patient tolerated well.  Post Procedure CXR: Ordered    Did this insertion attempt result  in a successful central line placement? yes    * Data needed by Infection Control for CLIP reporting   CLIP notification required for each line note (CL,PA Cath):Send CLIP notification [facility] lines)      Cloria Spring, RN RN

## 2013-04-23 NOTE — Progress Notes (Addendum)
INFECTIOUS DISEASE PROGRESS NOTE  Date: 04/23/2013 Time: 19:35   Date of Service (Patient contact): 04/23/2013     Interval History:   - Temp Min: 36.5 C (97.7 F) Max: 37.1 C (98.7 F)  - PICC placed    SUBJECTIVE:   Pain is poorly controled.  States back pain is unchanged from last night. Denies fever, chills, night sweats, cough, shortness of breath, chest pain, nausea/ vomiting or diarrhea.       Current Antimicrobials:  Vancomycin 1g IV q12h (11/14-  Cefepime 1g IV q6h (11/14-      OBJECTIVE:     Current Inpatient Medications  Scheduled Medications  Acetaminophen (TYLENOL) Tablet 1,000 mg, ORAL, Q6H  Cefepime (MAXIPIME) IV 1 g, IV, Q6H Now, Last Rate: 1 g (04/23/13 1720)  Cyclobenzaprine (FLEXERIL) Tablet 10 mg, ORAL, Q8H  Docusate (COLACE) Capsule 100 mg, ORAL, BID  Heparin PF 5,000 units/0.5 mL Injection 5,000 Units, SUBCUTANEOUS, Q8H  Sennosides (SENOKOT) Tablet 17.2 mg, ORAL, BID  Vancomycin (VANCOCIN) 1,000 mg in D5W 200 mL IVPB, IV, Q12H Now, Last Rate: 1,000 mg (04/23/13 1328)      IV Medications  NaCl 0.9% 1,000 mL with Potassium Chloride 20 mEq, , IV, CONTINUOUS, Last Rate: 20 mL/hr at 04/23/13 1910      PRN Medications  Acetaminophen (TYLENOL) Tablet 650 mg, ORAL, Q4H PRN  Bisacodyl (DULCOLAX) Suppository 10 mg, RECTALLY, Q24H PRN  Diazepam (VALIUM) Tablet 5 mg, ORAL, Q8H PRN  Lidocaine (L-M-X4) 4 % Cream, TOPICAL-site required, PRN  Lidocaine (XYLOCAINE) 10 mg/mL (1%) Injection 0.1-2 mL, INTRADERMAL, PRN  Lidocaine cream REMOVAL, TOPICAL-site required, PRN  Magnesium Hydroxide (MILK OF MAGNESIA) 400 mg/5 mL Suspension 30 mL, ORAL, Q12H PRN  Oxycodone (ROXICODONE) Tablet 10-20 mg, ORAL, Q4H PRN          Current Vitals (last recorded)  Temp: 36.8 C (98.3 F) (04/23/13 1910)   BP: 114/74 mmHg (04/23/13 1910)  Pulse: 86 (04/23/13 1910)  Resp: 20 (04/23/13 1910)  SpO2: 99 % (04/23/13 1910)        Weight: 63.4 kg (139 lb 12.4 oz)   Intake and Output: Last Two Completed Shifts:  I/O Last 2 Completed  Shifts:  In: 4085.7 [Oral:2080; Crystalloid:2005.7]  Out: 3350 [Urine:3350]      PHYSICAL EXAM:  General Appearance: recumbent young man in bed, no distress, pleasant  HEENT: EOMI, anicteric sclera, normal conjunctiva, moist mucous membranes without lesions or exudate. Nares normal.  Neck: supple  Heart: normal rate regular rhythm no murmurs, rubs or gallops   Lungs: clear to auscultation bilaterally, no crackles or wheezes   Abdomen: soft, non-tender, non-distended ; no HSM   Back:  Midline incision with surrounding erythema, induration improved from yesterday. Staples in place.  Extremities: no edema, clubbing, or cyanosis; distal pulses equal bilaterally  Skin: no rashes; normal turgor  Mental Status: fully oriented, normal affect  Lines & Drains: pIV      LAB TESTS/STUDIES:  CBC   Recent labs for the past 48 hours     04/22/13 1209 04/22/13 0944    WHITE BLOOD CELL COUNT 8.7 9.0    HEMOGLOBIN 11.5* 12.1*    HEMATOCRIT 33.7* 34.6*    PLATELET COUNT 660* 713*      BMP   Recent labs for the past 48 hours     04/22/13 1059 04/22/13 0944    GLUCOSE 80 85    UREA NITROGEN, BLOOD (BUN) 10 10    CREATININE BLOOD 0.79 0.86    SODIUM 142  144    POTASSIUM 4.0 3.8    CHLORIDE 108 108    CARBON DIOXIDE TOTAL 25 26    CALCIUM 9.3 9.3        Imaging: no new imaging since 11/9.    Microbiology:   [facility] 11/9 NGTD      ASSESSMENT / RECOMMENDATIONS:  37yr old male status post T10-L2 Poster fusion and T12 Laminectomy 10/27 for T12 burst fracture who presents with cellulitis at surgical site.     #Cellulitus: Likely due to recent surgery. Presented with erythema, induration and tenderness at lumbar incision site. Imaging shows no evidence of deep soft tissue infection or abscess. Typically, post-operative cellulitis is due to strep and staph organisms.  Pt is afebrile with no leukocytosis.   Continue to treat with Cefepime and Vancomycin. Duration will depend on clinical improvement.    -continue Cefepime 1g IV  q6h  -continue vancomycin 1g IV q12h  -check vancomycin trough 30 minutes before next dose    The above plan was discussed in detail and formulated with the attending, Dr. Janee Morn.     Thank you for allowing [place] to participate in the patient's care.  We will continue to follow with you.    Note Electronically Signed by Brantley Fling  ...................................  Brantley Fling, MD  Fellow - Division of Infectious Diseases  Department of Internal Medicine   [phone]    This patient was seen, evaluated, and care plan was developed with the resident. I agree with the findings and plan as outlined in the note above.    Willeen Cass MD  Associate Professor of Clinical Medicine  Department of Medicine - Division of Infectious Diseases  Department of Medical Microbiology and Immunology  Pager: [phone]

## 2013-04-23 NOTE — Nurse Focus (Signed)
Pt slept on/ off, snacks in between, medicated PRN pain with Norco. No neuro changes. Pt alert, oriented, conversant.

## 2013-04-23 NOTE — Nurse Assessment (Signed)
ASSESSMENT NOTE    Note Started: 04/23/2013       Assumed care of patient at 1100, report received from day shift nurse and orders reviewed. Patient seen, no signs of distress noted. Plan of Care reviewed and appropriate.    Wyonia Hough RN

## 2013-04-23 NOTE — Nurse Assessment (Signed)
ASSESSMENT NOTE    Note Started: 04/23/2013, 07:23     Initial assessment completed and recorded in EMR.  Report received from night shift nurse and orders reviewed. Plan of Care reviewed and appropriate, discussed with patient.  Stephens Shire, RN RN

## 2013-04-23 NOTE — Nurse Assessment (Signed)
ASSESSMENT NOTE    Note Started: 04/23/2013, 19:15     Initial assessment completed and recorded in EMR.  Report received from day shift nurse and orders reviewed. Plan of Care reviewed and appropriate, discussed with patient.VSS.NAD.Call light in reach,instructed to call for assistance.  Beatrix Shipper,  RN

## 2013-04-23 NOTE — Nurse Focus (Addendum)
Agreeable to maintaining intervals of po meds (Norco and Valium- when due) to address concern of pain control. So far given combination of po meds. Snacks requested x 2 separate episodes of sandwiches, ice cream and soda.

## 2013-04-23 NOTE — Nurse Focus (Signed)
Report given to [facility]

## 2013-04-23 NOTE — Nurse Assessment (Signed)
ASSESSMENT NOTE    Note Started: 04/23/2013, 1530     Initial assessment completed and recorded in EMR.  Report received from day shift nurse and orders reviewed. Plan of Care reviewed and appropriate, discussed with patient.  Pt denies shortness of breath, nausea, vomiting, numbness and tingling to all extremities. Gordy Clement RN

## 2013-04-24 MED ORDER — HEPARIN, PORCINE (PF) 100 UNIT/ML INTRAVENOUS SYRINGE
3.0000 mL | INJECTION | INTRAVENOUS | Status: DC | PRN
Start: 2013-04-24 — End: 2013-05-06
  Administered 2013-04-24 – 2013-05-05 (×18): 3 mL
  Filled 2013-04-24 (×10): qty 3
  Filled 2013-04-24: qty 6
  Filled 2013-04-24 (×8): qty 3

## 2013-04-24 MED ORDER — MORPHINE 2 MG/ML INJECTION SYRINGE
2.0000 mg | INJECTION | Freq: Once | INTRAMUSCULAR | Status: AC
Start: 2013-04-24 — End: 2013-04-24
  Administered 2013-04-24: 2 mg via INTRAVENOUS
  Filled 2013-04-24: qty 1

## 2013-04-24 NOTE — Nurse Assessment (Signed)
ASSESSMENT NOTE    Note Started: 04/24/2013, 19:50     Initial assessment completed and recorded in EMR.  Report received from day shift nurse and orders reviewed. Plan of Care reviewed and appropriate, discussed with patient.  Jenita Seashore, RN RN

## 2013-04-24 NOTE — Progress Notes (Addendum)
INFECTIOUS DISEASE PROGRESS NOTE  Date: 04/24/2013 Time: 13:31   Date of Service (Patient contact): 04/24/2013     Interval History:   - Temp Min: 36.4 C (97.6 F) Max: 36.8 C (98.3 F)  - No events overnight.    SUBJECTIVE:   ROS: Back pain is better controlled. Appetite is good. Ambulating well. Having BM. Denies fever, chills, night sweats, cough, shortness of breath, chest pain, nausea/ vomiting or diarrhea.  A complete review of systems was performed and was negative except as noted above.    Current Antimicrobials:  Vancomycin 1g IV q12h (11/14-  Cefepime 1g IV q6h (11/14-      OBJECTIVE:     Current Inpatient Medications  Scheduled Medications  Acetaminophen (TYLENOL) Tablet 1,000 mg, ORAL, Q6H  Cefepime (MAXIPIME) IV 1 g, IV, Q6H Now, Last Rate: Stopped (04/24/13 1050)  Cyclobenzaprine (FLEXERIL) Tablet 10 mg, ORAL, Q8H  Docusate (COLACE) Capsule 100 mg, ORAL, BID  Heparin PF 5,000 units/0.5 mL Injection 5,000 Units, SUBCUTANEOUS, Q8H  Nicotine (NICODERM CQ) 21 mg/24 hr Patch 1 patch, Transdermal, Q24H Now  [START ON 04/25/2013] Nicotine patch REMOVAL 1 patch, Transdermal, Q24H Now  Sennosides (SENOKOT) Tablet 17.2 mg, ORAL, BID  Vancomycin (VANCOCIN) 1,000 mg in D5W 200 mL IVPB, IV, Q12H Now, Last Rate: 1,000 mg (04/24/13 1211)      IV Medications  NaCl 0.9% 1,000 mL with Potassium Chloride 20 mEq, , IV, CONTINUOUS, Last Rate: 20 mL/hr at 04/24/13 1300      PRN Medications  Acetaminophen (TYLENOL) Tablet 650 mg, ORAL, Q4H PRN  Bisacodyl (DULCOLAX) Suppository 10 mg, RECTALLY, Q24H PRN  Diazepam (VALIUM) Tablet 5 mg, ORAL, Q8H PRN  Heparin (PF) 100 units/mL Flush Syringe 3 mL, Intercatheter, PRN  Lidocaine (L-M-X4) 4 % Cream, TOPICAL-site required, PRN  Lidocaine (XYLOCAINE) 10 mg/mL (1%) Injection 0.1-2 mL, INTRADERMAL, PRN  Lidocaine cream REMOVAL, TOPICAL-site required, PRN  Magnesium Hydroxide (MILK OF MAGNESIA) 400 mg/5 mL Suspension 30 mL, ORAL, Q12H PRN  Oxycodone (ROXICODONE) Tablet 10-20 mg, ORAL,  Q4H PRN          Current Vitals (last recorded)  Temp: 36.4 C (97.6 F) (04/24/13 1214)   BP: 107/72 mmHg (04/24/13 1214)  Pulse: 93 (04/24/13 1214)  Resp: 16 (04/24/13 1214)  SpO2: 99 % (04/24/13 1214)        Weight: 63.4 kg (139 lb 12.4 oz)   Intake and Output: Last Two Completed Shifts:  I/O Last 2 Completed Shifts:  In: 2744 [Oral:2030; Crystalloid:714]  Out: 3825 [Urine:3825]      PHYSICAL EXAM:  General Appearance: recumbent young man in bed, no distress, pleasant  HEENT: EOMI, anicteric sclera, normal conjunctiva, moist mucous membranes without lesions or exudate. Nares normal.  Neck: supple  Heart: normal rate regular rhythm no murmurs, rubs or gallops   Lungs: clear to auscultation bilaterally, no crackles or wheezes   Abdomen: soft, non-tender, non-distended ; no HSM   Back:  Midline incision with improving surrounding erythema & induration compared to yesterday. Staples in place.  Extremities: no edema, clubbing, or cyanosis; distal pulses equal bilaterally  Skin: no rashes; normal turgor  Mental Status: fully oriented, normal affect  Lines & Drains: pICC      LAB TESTS/STUDIES:  CBC     No results found for this basename: WBC:*,HGB:*,HCT:*,PLT:* in the last 48 hours   BMP     No results found for this basename: GLU:*,BUN:*,CR:*,NA:*,K:*,CL:*,CO2:*,CA:* in the last 48 hours     IMAGING: no new imaging since 11/9.  Microbiology:   [facility] 11/9 NGTD    ASSESSMENT / RECOMMENDATIONS:  37yr old male status post T10-L2 poster fusion and T12 laminectomy 10/27 for T12 burst fracture who presents with cellulitis at surgical site.     #Cellulitus: Likely due to recent surgery. Presented with erythema, induration and tenderness at lumbar incision site. Imaging shows no evidence of deep soft tissue infection or abscess. Typically, post-operative cellulitis is due to strep and staph organisms.  Pt is afebrile with no leukocytosis. Now erythema is clinically improved.  Continue to treat with Cefepime and Vancomycin.  Anticipate at least 2 week duration via IV given location and recent surgery though will continue to follow clinically and reassess.     -continue Cefepime 1g IV q6h  -continue vancomycin 1g IV q12h  -check vancomycin trough 30 minutes before next dose    The above plan was discussed in detail and formulated with the attending, Dr. Janee Morn.     Thank you for allowing [place] to participate in the patient's care.  We will continue to follow with you.    Note Electronically Signed by Brantley Fling  ...................................  Brantley Fling, MD  Fellow - Division of Infectious Diseases  Department of Internal Medicine   [phone]    This patient was seen, evaluated, and care plan was developed with the resident. I agree with the findings and plan as outlined in the note above.    Willeen Cass MD  Associate Professor of Clinical Medicine  Department of Medicine - Division of Infectious Diseases  Department of Medical Microbiology and Immunology  Pager: [phone]

## 2013-04-24 NOTE — Progress Notes (Addendum)
DEPARTMENT OF NEUROLOGICAL SURGERY  WARD:  CRANIAL PROGRESS NOTE  04/24/2013 HD 3 POD: 20  S/P: T10-L2 Poster fusion and T12 Laminectomy     INTERVAL SUMMARY:     - PICC Placed  - Incision site appearance stable, erythematous along staples track    SUBJECTIVE:  Pain is better controlled.    OBJECTIVE:     Medications  Scheduled Medications  Acetaminophen (TYLENOL) Tablet 1,000 mg, ORAL, Q6H  Cefepime (MAXIPIME) IV 1 g, IV, Q6H Now, Last Rate: 1 g (04/24/13 1020)  Cyclobenzaprine (FLEXERIL) Tablet 10 mg, ORAL, Q8H  Docusate (COLACE) Capsule 100 mg, ORAL, BID  Heparin PF 5,000 units/0.5 mL Injection 5,000 Units, SUBCUTANEOUS, Q8H  Nicotine (NICODERM CQ) 21 mg/24 hr Patch 1 patch, Transdermal, Q24H Now  [START ON 04/25/2013] Nicotine patch REMOVAL 1 patch, Transdermal, Q24H Now  Sennosides (SENOKOT) Tablet 17.2 mg, ORAL, BID  Vancomycin (VANCOCIN) 1,000 mg in D5W 200 mL IVPB, IV, Q12H Now, Last Rate: Stopped (04/24/13 0201)    Continuous Medications  NaCl 0.9% 1,000 mL with Potassium Chloride 20 mEq, , IV, CONTINUOUS, Last Rate: 20 mL/hr at 04/23/13 1910      PRN Medications  Acetaminophen (TYLENOL) Tablet 650 mg, ORAL, Q4H PRN  Bisacodyl (DULCOLAX) Suppository 10 mg, RECTALLY, Q24H PRN  Diazepam (VALIUM) Tablet 5 mg, ORAL, Q8H PRN  Heparin (PF) 100 units/mL Flush Syringe 3 mL, Intercatheter, PRN  Lidocaine (L-M-X4) 4 % Cream, TOPICAL-site required, PRN  Lidocaine (XYLOCAINE) 10 mg/mL (1%) Injection 0.1-2 mL, INTRADERMAL, PRN  Lidocaine cream REMOVAL, TOPICAL-site required, PRN  Magnesium Hydroxide (MILK OF MAGNESIA) 400 mg/5 mL Suspension 30 mL, ORAL, Q12H PRN  Oxycodone (ROXICODONE) Tablet 10-20 mg, ORAL, Q4H PRN        Current Vital Signs  BP 105/69  Pulse 75  Temp(Src) 36.6 C (97.9 F) (Oral)  Resp 18  Ht 1.727 m (5\' 8" )  Wt 63.4 kg (139 lb 12.4 oz)  BMI 21.26 kg/m2  SpO2 98%    24 Hour Vital Signs    Temp src:  [-]   Temp:  [36.5 C (97.7 F)-36.8 C (98.3 F)]   Pulse:  [75-89]   BP: (97-120)/(58-78)    Resp:  [16-20]   SpO2:  [97 %-100 %]     I/O Last 2 Completed Shifts:  In: 2744 [Oral:2030; Crystalloid:714]  Out: 3825 [Urine:3825]    I/O Current Shift:  In: 960 [Oral:960]  Out: 720 [Urine:720]    Neuro Exam: T  GCS: 15  Pupils: OD size: 3 mm, reaction: brisk   OS size: 3 mm, reaction: brisk  Cranial Nerves: 2-12 intact   MAE 5/5 in all extremities  Stable paresthesias in the lateral thighs and legs since post op.     Wound: Staples in place, erythema surrounding the wound staple edge. TTP on the right aspect of wound.     Labs:  Latest Chem--7 with Mg    No results found for this basename: NA,K,CL,CO2,BUN,CR,GLU,MG in the last 48 hours  Latest CBC  Recent labs for the past 48 hours     04/22/13 1209    WHITE BLOOD CELL COUNT 8.7    HEMOGLOBIN 11.5*    HEMATOCRIT 33.7*    PLATELET COUNT 660*     Latest INR / APTT    No results found for this basename: INR,APTT in the last 48 hours  ABG Last 24 hours   No results found for this basename: ARTPO2:*,ARTO2SAT:*,ARTPCO2:*,ARTPH:*,ARTHCO3:*,ARTBE:* in the last 24 hours    PLAN:  Neuro: S/P superficial wound infection  - Pain control on oral medications  - Vanc q12hrs + Cefepime q6hrs per ID day 3/?  - Serial neuroexams    Pulmonary: Stable on RA  CV: Stable  ID: Possible wound infection, on Abx as above per ID.   F/E/N: NS 123ml/hr  DVT: SCDs, SQH  Lines: Rt PICC 11/15, PIVs  Misc: staples removed.     Lissa Hoard, M.D. PGY2  [place]-Neurosurgery  NSG Svc Pager: [phone]  Direct Pager [phone]  PI# [phone]      I agree with the assessment and plan as outlined in the resident's note.  Report electronically signed by Fernande Bras, MD. Attending

## 2013-04-24 NOTE — Nurse Assessment (Signed)
ASSESSMENT NOTE    Note Started: 04/24/2013, 07:57     Initial assessment completed and recorded in EMR.  Report received from night shift nurse and orders reviewed. Plan of Care for safety, mobility and pain control reviewed and appropriate, discussed with patient. Call light within reach, encouraged use of IS. Sheran Luz, RN RN

## 2013-04-25 ENCOUNTER — Inpatient Hospital Stay: Payer: MEDICAID

## 2013-04-25 NOTE — Nurse Assessment (Signed)
ASSESSMENT NOTE    Note Started: 04/25/2013, 21:04     Initial assessment completed and recorded in EMR.  Report received from day shift nurse and orders reviewed. Plan of Care reviewed and appropriate, discussed with patient.  Artha Chiasson, RN

## 2013-04-25 NOTE — Nurse Assessment (Signed)
ASSESSMENT NOTE    Note Started: 04/25/2013, 09:54     Initial assessment completed and recorded in EMR.  Report received from night shift nurse and orders reviewed. Plan of Care reviewed and appropriate, discussed with patient.  Desmond Lope,  RN

## 2013-04-25 NOTE — Progress Notes (Addendum)
DEPARTMENT OF NEUROLOGICAL SURGERY  WARD:  SPINE PROGRESS NOTE    04/25/2013 HD 3 POD: 21  S/P: T10-L2 Poster fusion and T12 Laminectomy     ID: Carl Mata is a 37yr old male, with who sustained a T12 burst fracture (hx fall 10/31/12 and 12/06/12) and underwent T12 laminectomy, T10-L2 posterior fusion by Dr. Selena Batten on 04/04/13. He was admitted on 04/17/13 for concern of possible wound infection but then left AMA on 04/18/13. He presented to the ED on 04/22/13 with a chief complaint of back pain that began months ago. Stated on 11/14 that he ran out of his MSContin 30mg  tabs that was prescribed with pain 8/0-10. Had one episode of urinary incontinence 04/21/13. Denied numbness or weakness of the BLE. Able to ambulate. No fevers at home. He has nausea but not vomiting.     INTERVAL SUMMARY:     NAE    SUBJECTIVE:   - Pain is better controlled. Reports currently <=3/0-10 scale with current regime.  - Reports working on home/living situation for dispo.  - Reports ambulating without problem. Goes outside, has cut back on smoking but still does so.    OBJECTIVE:     Medications  Scheduled Medications  Acetaminophen (TYLENOL) Tablet 1,000 mg, ORAL, Q6H  Cefepime (MAXIPIME) IV 1 g, IV, Q6H Now, Last Rate: 0 g (04/24/13 2327)  Cyclobenzaprine (FLEXERIL) Tablet 10 mg, ORAL, Q8H  Docusate (COLACE) Capsule 100 mg, ORAL, BID  Heparin PF 5,000 units/0.5 mL Injection 5,000 Units, SUBCUTANEOUS, Q8H  Nicotine (NICODERM CQ) 21 mg/24 hr Patch 1 patch, Transdermal, Q24H Now  Nicotine patch REMOVAL 1 patch, Transdermal, Q24H Now  Sennosides (SENOKOT) Tablet 17.2 mg, ORAL, BID  Vancomycin (VANCOCIN) 1,000 mg in D5W 200 mL IVPB, IV, Q12H Now, Last Rate: Stopped (04/25/13 0148)    Continuous Medications  NaCl 0.9% 1,000 mL with Potassium Chloride 20 mEq, , IV, CONTINUOUS, Last Rate: 20 mL/hr at 04/24/13 2121      PRN Medications  Acetaminophen (TYLENOL) Tablet 650 mg, ORAL, Q4H PRN  Bisacodyl (DULCOLAX) Suppository 10 mg, RECTALLY,  Q24H PRN  Diazepam (VALIUM) Tablet 5 mg, ORAL, Q8H PRN  Heparin (PF) 100 units/mL Flush Syringe 3 mL, Intercatheter, PRN  Lidocaine (L-M-X4) 4 % Cream, TOPICAL-site required, PRN  Lidocaine (XYLOCAINE) 10 mg/mL (1%) Injection 0.1-2 mL, INTRADERMAL, PRN  Lidocaine cream REMOVAL, TOPICAL-site required, PRN  Magnesium Hydroxide (MILK OF MAGNESIA) 400 mg/5 mL Suspension 30 mL, ORAL, Q12H PRN  Oxycodone (ROXICODONE) Tablet 10-20 mg, ORAL, Q4H PRN      Current Vital Signs  BP 93/66  Pulse 76  Temp(Src) 36.8 C (98.2 F) (Oral)  Resp 16  Ht 1.727 m (5\' 8" )  Wt 63.4 kg (139 lb 12.4 oz)  BMI 21.26 kg/m2  SpO2 100%    24 Hour Vital Signs    Temp src:  [-]   Temp:  [36.2 C (97.2 F)-36.8 C (98.2 F)]   Pulse:  [59-93]   BP: (93-118)/(62-72)   Resp:  [16]   SpO2:  [98 %-100 %]     I/O Last 2 Completed Shifts:  In: 4242.7 [Oral:3320; Crystalloid:922.7]  Out: 2920 [Urine:2920]    Intake/Output Summary (Last 24 hours) at 04/25/13 0754  Last data filed at 04/25/13 0439   Gross per 24 hour   Intake 4242.67 ml   Output   2700 ml   Net 1542.67 ml      Neuro Exam: Laying in bed sleeping, easily arousable.   GCS: 15  Pupils: OU  size: 3 mm, reaction: brisk  Cranial Nerves: II-XII intact   Motor exam: 5/5 thoughout  Sensory exam: Intact to PP throughout torso/LEs.     Wound: Erythema along wound. No fluctuance nor drainage. Non-tender. Small extruded stitch at midpoint on wound.     Labs:  Latest Chem--7 with Mg    No results found for this basename: NA,K,CL,CO2,BUN,CR,GLU,MG in the last 48 hours  Latest CBC    No results found for this basename: WBC,HGB,HCT,PLT in the last 48 hours  Latest INR / APTT    No results found for this basename: INR,APTT in the last 48 hours  ABG Last 24 hours   No results found for this basename: ARTPO2:*,ARTO2SAT:*,ARTPCO2:*,ARTPH:*,ARTHCO3:*,ARTBE:* in the last 24 hours    Cultures:    Collection Date and Time Test Name Source Susceptibilities Status Organisms Isolated    04/22/2013 2122 CULTURE  SURVEILLANCE, MRSA NASAL SWAB  Final     04/18/2013 0803 CULTURE SURVEILLANCE, MRSA NASAL SWAB  Final     04/17/2013 1534 CULTURE BLOOD, BACTI (INCLUDES YEAST) BLOOD  Final     04/17/2013 1518 CULTURE BLOOD, BACTI (INCLUDES YEAST) BLOOD  Final     04/10/2013 0045 CULTURE SURVEILLANCE, MRSA NASAL SWAB  Final     04/04/2013 2226 CULTURE SURVEILLANCE, MRSA NASAL SWAB  Final     04/04/2013 1513 CULTURE SURVEILLANCE, MRSA NASAL SWAB  Final     12/07/2012 1540 CULTURE SURVEILLANCE, MRSA NASAL SWAB  Final         PLAN:   Neuro: S/P superficial wound infection  - Pain control on oral medications  - Vanc q12hrs + Cefepime q6hrs per ID day 4/14+  - Serial neuro exams    Pulmonary: Stable on RA  CV: Stable  ID: Cellulitis/superficial wound infection, on abx as above per ID. Vanc trough today 11.  F/E/N: NS TKO  GI: Regular diet, routine bowel care, last BM 11/16.  GU: Voids spont. No incontinence.  DVT: SCDs, SQH  Lines: Rt PICC 11/15, PIVs  Misc: Staples removed.   Dispo: Reports housing situation is not established yet. Working on it today. Asks about discharge home on IV abx.    Report electronically signed by:  Daleen Squibb, PhD, RN, NP-BC  Nurse Practitioner III  Department of Neurological Surgery  [phone], Page [phone]     I agree with the assessment and plan as outlined in the note.  Report electronically signed by Fernande Bras, MD. Attending

## 2013-04-25 NOTE — Progress Notes (Addendum)
INFECTIOUS DISEASE PROGRESS NOTE  Date: 04/25/2013 Time: 14:26   Date of Service (Patient contact): 04/25/2013     Interval History:   - Temp Min: 36.2 C (97.2 F) Max: 36.8 C (98.2 F)  - No events overnight.    SUBJECTIVE:   ROS: Back pain is better controlled. Appetite is good. Having daily BM. Sleep is difficult. Denies fever, chills, night sweats, cough, shortness of breath, chest pain, nausea/ vomiting or diarrhea.  A complete review of systems was performed and was negative except as noted above.    Current Antimicrobials:  Vancomycin 1g IV q12h (11/14-  Cefepime 1g IV q6h (11/14-      OBJECTIVE:     Current Inpatient Medications  Scheduled Medications  Acetaminophen (TYLENOL) Tablet 1,000 mg, ORAL, Q6H  Cefepime (MAXIPIME) IV 1 g, IV, Q6H Now, Last Rate: Stopped (04/25/13 1124)  Cyclobenzaprine (FLEXERIL) Tablet 10 mg, ORAL, Q8H  Docusate (COLACE) Capsule 100 mg, ORAL, BID  Heparin PF 5,000 units/0.5 mL Injection 5,000 Units, SUBCUTANEOUS, Q8H  Nicotine (NICODERM CQ) 21 mg/24 hr Patch 1 patch, Transdermal, Q24H Now  Nicotine patch REMOVAL 1 patch, Transdermal, Q24H Now  Sennosides (SENOKOT) Tablet 17.2 mg, ORAL, BID  Vancomycin (VANCOCIN) 1,000 mg in D5W 200 mL IVPB, IV, Q12H Now, Last Rate: Stopped (04/25/13 1349)      IV Medications  NaCl 0.9% 1,000 mL with Potassium Chloride 20 mEq, , IV, CONTINUOUS, Last Rate: 20 mL/hr at 04/24/13 2121      PRN Medications  Acetaminophen (TYLENOL) Tablet 650 mg, ORAL, Q4H PRN  Bisacodyl (DULCOLAX) Suppository 10 mg, RECTALLY, Q24H PRN  Diazepam (VALIUM) Tablet 5 mg, ORAL, Q8H PRN  Heparin (PF) 100 units/mL Flush Syringe 3 mL, Intercatheter, PRN  Lidocaine (L-M-X4) 4 % Cream, TOPICAL-site required, PRN  Lidocaine (XYLOCAINE) 10 mg/mL (1%) Injection 0.1-2 mL, INTRADERMAL, PRN  Lidocaine cream REMOVAL, TOPICAL-site required, PRN  Magnesium Hydroxide (MILK OF MAGNESIA) 400 mg/5 mL Suspension 30 mL, ORAL, Q12H PRN  Oxycodone (ROXICODONE) Tablet 10-20 mg, ORAL, Q4H  PRN          Current Vitals (last recorded)  Temp: 36.8 C (98.2 F) (04/25/13 1138)   BP: 111/68 mmHg (04/25/13 1138)  Pulse: 93 (04/25/13 1138)  Resp: 16 (04/25/13 1138)  SpO2: 99 % (04/25/13 1138)        Weight: 63.4 kg (139 lb 12.4 oz)   Intake and Output: Last Two Completed Shifts:  I/O Last 2 Completed Shifts:  In: 4242.7 [Oral:3320; Crystalloid:922.7]  Out: 2920 [Urine:2920]      PHYSICAL EXAM:  General Appearance: recumbent young man in bed, NAD, awake and alert  HEENT: EOMI, anicteric sclera, normal conjunctiva, moist mucous membranes without lesions or exudate. Nares normal.  Neck: supple  Heart: normal rate regular rhythm no murmurs, rubs or gallops   Lungs: clear to auscultation bilaterally, no crackles or wheezes   Abdomen: soft, non-tender, non-distended ; no HSM   Back:  Midline incision with improving surrounding erythema & induration compared to yesterday. Staples removed.  Extremities: no edema, clubbing, or cyanosis; distal pulses equal bilaterally  Skin: no rashes; normal turgor  Mental Status: fully oriented, normal affect  Lines & Drains: pICC      LAB TESTS/STUDIES:  CBC     No results found for this basename: WBC:*,HGB:*,HCT:*,PLT:* in the last 48 hours   BMP     No results found for this basename: GLU:*,BUN:*,CR:*,NA:*,K:*,CL:*,CO2:*,CA:* in the last 48 hours     IMAGING: no new imaging since 11/9.  Microbiology:   [facility] 11/9 NGTD    ASSESSMENT / RECOMMENDATIONS:  37yr old male status post T10-L2 poster fusion and T12 laminectomy 10/27 for T12 burst fracture who presents with cellulitis at surgical site.     #Cellulitus: Likely due to recent surgery. Presented with erythema, induration and tenderness at lumbar incision site. Imaging shows no evidence of deep soft tissue infection or abscess. Typically, post-operative cellulitis is due to strep and staph organisms, however because of site of surgery near CNS will cover for pseudomonas as well though did not enter spinal canal.  Erythema is  clinically improved.Continue to treat with Cefepime and Vancomycin. Anticipate at least 2 week duration via IV given location and recent surgery though will continue to follow clinically and reassess to see if pt might be able to switch to oral regimen in future.    -continue Cefepime 1g IV q6h  -continue vancomycin 1g IV q12h, goal trough 10-15    The above plan was discussed in detail and formulated with the attending, Dr. Cyndie Chime.     Thank you for allowing [place] to participate in the patient's care.  We will continue to follow with you.    Note Electronically Signed by Brantley Fling  ...................................  Brantley Fling, MD  Fellow - Division of Infectious Diseases  Department of Internal Medicine   [phone]      ATTENDING ADDENDUM    I saw and examined the patient on 04/25/2013. I agree and verified the history and physical exam above. I personally reviewed the labs and co-developed the care plan together.    Thanks for the consultation. We will follow with you.    Report electronically signed by: Trecia Rogers, MD, MAS  Professor of Medicine  Infectious Diseases

## 2013-04-25 NOTE — Allied Health Progress (Signed)
CLINICAL CASE MANAGEMENT  ASSESSMENT NOTE    Name: Carl Mata  MRN: [id number]   Date of Birth:1975/08/02 (64yr) Gender:male    Note Date: 04/25/2013 Note Time: 12:59   Date of Service: 04/25/2013 Time of Service: 13:00     Patient able to participate in plan? Alert and oriented  Contact Person/Caregiver if unable to participate  : Jennette Kettle (fiance') - [phone]   Lives with: above   Family/Friends to assist: above  Funding: Managed Med[employer]  Primary Care Physician Identified / Phone Number:  none  Preferred Pharmacy:  [facility] in [facility] - patient could not be more specific  Permanent Address:  [address] - verified  [place] [facility] [place] [facility] [place] [address]  Discharge Address:  TBD  Pre-existing Lines/Drains/Wounds: surgical incision  Pre-Hospitalization Level of Care: independent  Current Functional Status: independent  Home Health and/or Resources in Place: none   DME in Place: none   Potential Barriers to Discharge: parolee, hx meth use, multiple AMA discharges, homeless?   Anticipated DC Needs: IV antibiotics x 2 weeks  Patient/Family offered choice of provider and agreeable with plan/Referrals? n/a  Patient/family provided with long-term care community resources:  n/a  Comments: Up to floor - spoke with patient at bedside.  Discussed need for IV antibiotics and options.  Patient verified home address on [place], lives with fiance' Deanna, does not feel that he or Deanna would be able to administer IV medications.  Discussed infusion referral process and home health involvement.  Informed patient DCP would further discuss his discharge needs with team - anticipate he will need to stay for the duration of treatment.  Discussed case with neurosurgery NP - Lawson Fiscal.  Per Lawson Fiscal - team unwilling to discharge patient with PICC given his drug use, will inform patient 04/26/13.  Patient currently without discharge disposition.       Plan/Follow-up needed: Will continue to follow for discharge planning needs.     Electronically  Signed by:    Cleotilde Neer, RN, Clinical Case Manager  [phone]  [phone]

## 2013-04-26 MED ORDER — VANCOMYCIN 1.25 GRAM/250 ML IN DEXTROSE 5 % INTRAVENOUS SOLUTION
1250.0000 mg | Freq: Two times a day (BID) | INTRAVENOUS | Status: DC
Start: 2013-04-26 — End: 2013-05-04
  Administered 2013-04-26 – 2013-05-04 (×16): 1250 mg via INTRAVENOUS
  Filled 2013-04-26 (×17): qty 250

## 2013-04-26 NOTE — Progress Notes (Addendum)
DEPARTMENT OF NEUROLOGICAL SURGERY  WARD:  SPINE PROGRESS NOTE    04/26/2013 HD 4 POD: 22  S/P: T10-L2 Poster fusion and T12 Laminectomy     ID: Carl Mata is a 37yr old male, with who sustained a T12 burst fracture (hx fall 10/31/12 and 12/06/12) and underwent T12 laminectomy, T10-L2 posterior fusion by Dr. Selena Batten on 04/04/13. He was admitted on 04/17/13 for concern of possible wound infection but then left AMA on 04/18/13. He presented to the ED on 04/22/13 with a chief complaint of back pain that began months ago. Stated on 11/14 that he ran out of his MSContin 30mg  tabs that was prescribed with pain 8/0-10. Had one episode of urinary incontinence 04/21/13. Denied numbness or weakness of the BLE. Able to ambulate. No fevers at home. He has nausea but not vomiting.     INTERVAL SUMMARY:     NAE    SUBJECTIVE:   - Pain is better controlled. Reports currently <=3/0-10 scale with current regime (at worst 7/0-10 prior to rx)  - Reports ambulating without problem. Goes outside, has not smoked in last day.    OBJECTIVE:     Medications  Scheduled Medications  Acetaminophen (TYLENOL) Tablet 1,000 mg, ORAL, Q6H  Cefepime (MAXIPIME) IV 1 g, IV, Q6H Now, Last Rate: 1 g (04/25/13 1638)  Cyclobenzaprine (FLEXERIL) Tablet 10 mg, ORAL, Q8H  Docusate (COLACE) Capsule 100 mg, ORAL, BID  Heparin PF 5,000 units/0.5 mL Injection 5,000 Units, SUBCUTANEOUS, Q8H  Nicotine (NICODERM CQ) 21 mg/24 hr Patch 1 patch, Transdermal, Q24H Now  Nicotine patch REMOVAL 1 patch, Transdermal, Q24H Now  Sennosides (SENOKOT) Tablet 17.2 mg, ORAL, BID  Vancomycin (VANCOCIN) 1,250 mg in D5W 250 mL IVPB, IV, Q12H Now    Continuous Medications  NaCl 0.9% 1,000 mL with Potassium Chloride 20 mEq, , IV, CONTINUOUS, Last Rate: 20 mL/hr at 04/26/13 0723      PRN Medications  Acetaminophen (TYLENOL) Tablet 650 mg, ORAL, Q4H PRN  Bisacodyl (DULCOLAX) Suppository 10 mg, RECTALLY, Q24H PRN  Diazepam (VALIUM) Tablet 5 mg, ORAL, Q8H PRN  Heparin (PF) 100  units/mL Flush Syringe 3 mL, Intercatheter, PRN  Lidocaine (L-M-X4) 4 % Cream, TOPICAL-site required, PRN  Lidocaine (XYLOCAINE) 10 mg/mL (1%) Injection 0.1-2 mL, INTRADERMAL, PRN  Lidocaine cream REMOVAL, TOPICAL-site required, PRN  Magnesium Hydroxide (MILK OF MAGNESIA) 400 mg/5 mL Suspension 30 mL, ORAL, Q12H PRN  Oxycodone (ROXICODONE) Tablet 10-20 mg, ORAL, Q4H PRN      Current Vital Signs  BP 115/74  Pulse 83  Temp(Src) 36.8 C (98.3 F) (Oral)  Resp 16  Ht 1.727 m (5\' 8" )  Wt 63.4 kg (139 lb 12.4 oz)  BMI 21.26 kg/m2  SpO2 96%    24 Hour Vital Signs    Temp src:  [-]   Temp:  [36.7 C (98.1 F)-36.8 C (98.3 F)]   Pulse:  [78-93]   BP: (109-115)/(68-74)   Resp:  [16]   SpO2:  [94 %-99 %]     I/O Last 2 Completed Shifts:  In: 1980 [Oral:1230; Crystalloid:750]  Out: 3100 [Urine:3100]      Intake/Output Summary (Last 24 hours) at 04/26/13 0823  Last data filed at 04/26/13 0723   Gross per 24 hour   Intake   1980 ml   Output   3100 ml   Net  -1120 ml        Neuro Exam: Laying in bed appearing comfortable.   GCS: 15  Pupils: OU size: 3 mm, reaction: brisk  Cranial Nerves: II-XII intact   Motor exam: 5/5 thoughout  Sensory exam: Intact to PP throughout torso/LEs.     Wound: Erythema along wound. No fluctuance nor drainage. Non-tender. Small extruded stitch at midpoint on wound.     Labs:  CRP: 1.6 (11/18), 1.4 (11/16), 3.1 (11/14)  ESR: 46 (11/18, 44 (11/16), 77 (11/14)    Cultures:   11/9 blood cultures final: no growth    PLAN:   Neuro: S/P superficial wound infection  - Pain control on oral medications  - Vanc q12hrs + Cefepime q6hrs per ID day 5/14  - Serial neuro exams    Pulmonary: Stable on RA  CV: Stable  ID: Cellulitis/superficial wound infection, on abx as above per ID. Vanc trough 11 (goal 10-15). Asked ID about oral antibiotic alternatives. They advised Levofloxacin 750 mg PO daily and Doxycycline 100 mg PO BID for remaining days of 14-day antibiotic regime (plan to d/c PICC and switch to PO  once dispo is clarified, if within 14-day period).  F/E/N: NS TKO  GI: Regular diet, routine bowel care, last BM 11/17.  GU: Voids spont. No incontinence.  DVT: SCDs, SQH  Lines: Rt PICC 11/15, PIVs  Misc: Staples removed.   Dispo: Reports housing situation is not established yet. [place] location yesterday has fallen through. Still looking. Plan to continue IV abx until dispo ID'd. If dispo ID'd prior to 14-day course, will d/c PICC and switch remaining days of therapy to PO recs.    Report electronically signed by:  Daleen Squibb, PhD, RN, NP-BC  Nurse Practitioner III  Department of Neurological Surgery  [phone], Page [phone]     I agree with the assessment and plan as outlined in the note.  Report electronically signed by Fernande Bras, MD. Attending

## 2013-04-26 NOTE — Progress Notes (Addendum)
INFECTIOUS DISEASE PROGRESS NOTE  Date: 04/26/2013 Time: 08:15   Date of Service (Patient contact): 04/26/2013     Interval History:   - Temp Min: 36.7 C (98.1 F) Max: 36.8 C (98.3 F)  - NAEO      SUBJECTIVE:   Reports doing well. Notes having some bilateral numbing pain along both sides of his trunk that is relieved by his pain regimen. Endorses walking up out of bed frequently during the day. Denies F/C/CP/SoB, numbness in his extremities, tingling, and edema      Current Antimicrobials:  Vancomycin 1g IV q12h (11/14-  Cefepime 1g IV q6h (11/14-    OBJECTIVE:     Current Inpatient Medications  Scheduled Medications  Acetaminophen (TYLENOL) Tablet 1,000 mg, ORAL, Q6H  Cefepime (MAXIPIME) IV 1 g, IV, Q6H Now, Last Rate: 1 g (04/25/13 1638)  Cyclobenzaprine (FLEXERIL) Tablet 10 mg, ORAL, Q8H  Docusate (COLACE) Capsule 100 mg, ORAL, BID  Heparin PF 5,000 units/0.5 mL Injection 5,000 Units, SUBCUTANEOUS, Q8H  Nicotine (NICODERM CQ) 21 mg/24 hr Patch 1 patch, Transdermal, Q24H Now  Nicotine patch REMOVAL 1 patch, Transdermal, Q24H Now  Sennosides (SENOKOT) Tablet 17.2 mg, ORAL, BID  Vancomycin (VANCOCIN) 1,250 mg in D5W 250 mL IVPB, IV, Q12H Now      IV Medications  NaCl 0.9% 1,000 mL with Potassium Chloride 20 mEq, , IV, CONTINUOUS, Last Rate: 20 mL/hr at 04/26/13 0723      PRN Medications  Acetaminophen (TYLENOL) Tablet 650 mg, ORAL, Q4H PRN  Bisacodyl (DULCOLAX) Suppository 10 mg, RECTALLY, Q24H PRN  Diazepam (VALIUM) Tablet 5 mg, ORAL, Q8H PRN  Heparin (PF) 100 units/mL Flush Syringe 3 mL, Intercatheter, PRN  Lidocaine (L-M-X4) 4 % Cream, TOPICAL-site required, PRN  Lidocaine (XYLOCAINE) 10 mg/mL (1%) Injection 0.1-2 mL, INTRADERMAL, PRN  Lidocaine cream REMOVAL, TOPICAL-site required, PRN  Magnesium Hydroxide (MILK OF MAGNESIA) 400 mg/5 mL Suspension 30 mL, ORAL, Q12H PRN  Oxycodone (ROXICODONE) Tablet 10-20 mg, ORAL, Q4H PRN          Current Vitals (last recorded)  Temp: 36.8 C (98.3 F) (04/26/13 0719)   BP:  115/74 mmHg (04/26/13 0719)  Pulse: 83 (04/26/13 0719)  Resp: 16 (04/26/13 0719)  SpO2: 96 % (04/26/13 0719)        Weight: 63.4 kg (139 lb 12.4 oz)   Intake and Output: Last Two Completed Shifts:  I/O Last 2 Completed Shifts:  In: 1980 [Oral:1230; Crystalloid:750]  Out: 3100 [Urine:3100]      PHYSICAL EXAM:  General Appearance:  Thin male lying in bed, no distress, pleasant  HEENT: EOMI, anicteric sclera, normal conjunctiva.    Heart: normal rate regular rhythm no murmurs, rubs or gallops   Lungs: clear to auscultation in bilateral lung fields, no crackles or wheezes   Abdomen: soft, non-tender, non-distended; NBS  Extremities: no edema; distal pulses equal bilaterally  Skin: no rashes; normal turgor; numerous tattoos over legs, arms and neck; midline incision along thoracic spine has improved erythema and induration; width appears diminished; tender to palpation with decreased severity  Mental Status: fully oriented, normal affect  Lines & Drains: R basilic PICC installed 04/23/13, bandaged and without drainage    LAB TESTS/STUDIES:  CBC   No results found for this basename: WBC:*,HGB:*,HCT:*,PLT:* in the last 48 hours   BMP   No results found for this basename: GLU:*,BUN:*,CR:*,NA:*,K:*,CL:*,CO2:*,CA:* in the last 48 hours     Imaging: no new imaging    Microbiology:     Blood cultures 04/17/13 x 2  negative and final    ASSESSMENT / RECOMMENDATIONS:  37yr old male status post T10-L2 poster fusion and T12 laminectomy 10/27 for T12 burst fracture who presents with cellulitis at surgical site.     #Cellulitus: Likely due to recent surgery. Presented with erythema, induration and tenderness at lumbar incision site. Imaging shows no evidence of deep soft tissue infection or abscess. Typically, post-operative cellulitis is due to strep and staph organisms. Will cover for pseudomonas since surgery occurred near CNS although it did not penetrate the spinal canal.  His presenting symptoms have improved, and he reports less  pain. Patient has responded well to IV antibiotic regimen and can safely switch to PO regimen to finish his 2 week course which would also diminish his risk of a line infection through his PICC.    Final Recs:  -Levofloxacin 750 mg PO daily for 9 more days: patient was advised to avoid taking this medication with vitamins and to allow a 1-2 period during which he would abstain from dairy products, including cheese, when taking this medication  -Doxycycline 100mg  BID for 9 days    The above plan was discussed in detail and formulated with the attending, Dr. Cyndie Chime.     Thank you for allowing [place] to participate in the patient's care. We will sign off. If there are further questions, feel free to contact the Infectious Disease team.      Consuello Bossier, MD  PGY1  Internal Medicine   [facility]  PI# [phone]  Pager [phone]      ATTENDING ADDENDUM    I saw and examined the patient on 04/26/2013. I agree and verified the history and physical exam above. I personally reviewed the labs and co-developed the care plan together.    Thanks for the consultation.     Report electronically signed by: Trecia Rogers, MD, MAS  Professor of Medicine  Infectious Diseases

## 2013-04-26 NOTE — Nurse Assessment (Signed)
ASSESSMENT NOTE    Note Started: 04/26/2013, 19:36     Initial assessment completed and recorded in EMR.  Report received from day shift nurse and orders reviewed. No s/s of acute distress noted. Plan of Care reviewed and appropriate, discussed with patient.  Wonda Cheng, RN RN

## 2013-04-26 NOTE — Allied Health Progress (Signed)
Pastoral Care Progress Note                                 Spiritual Assessment Form    Chaplain:  Betha Loa Date of Note:  04/26/2013   Patient Name:  Carl Mata Unit:  (A) Neurosurgery   Faith Affiliation/Tradition:  N/A Age:  69yr  Sex:  male   Admit Date:  04/22/2013 Admit Diagnosis:  Wound infection after surgery, per patient summary     Spouse/Significant Other Name/Relationship:  Mother, father and siblings     Reason(s) for visit:  Introductory visit    Patient:  Limited receptivity.      Spiritual/Cultural/Social Issues Assessment:  Patient did not appear to want to talk very much. When asked about his significant others, chaplain had to ask him about his immediate family to ascertain those who are significant in his life.  He stated that he relies primarily upon himself.     Symptoms:  Patient seemed not to want to talk too much.    Advanced Care Planning:    Patient verbalizes / verifies knowledge of: Chaplain services    Spiritual Care Intervention:  Ministry of presence    Outcomes:  Calm.  Visit was interrupted by medical staff.    Plan of Care / Goals:  Continuing care: None    Referrals:  None  Contact Information:  Lanora Manis Misty Stanley) Ranee Gosselin   Chaplain Resident  Pager Number: [phone]  On Call Chaplain Pager:  [phone])

## 2013-04-26 NOTE — Nurse Assessment (Signed)
ASSESSMENT NOTE    Note Started: 04/26/2013, 07:34     Initial assessment completed and recorded in EMR.  Report received from night shift nurse and orders reviewed. NAD, denies SOB, chest pain. Pt remains afebrile, denies chills. Plan of Care reviewed and appropriate, discussed with patient.  Golden Pop RN

## 2013-04-27 NOTE — Allied Health Progress (Addendum)
Carl Mata MRN: [id number]      Clinical Care Management Progress Note    Comments: Per neurosurgery NP - patient to remain in house until IV antibiotics are completed (currently day 6/14).  Patient homeless, parolee, psych hx and meth abuse.  Multiple AMA discharges with previous admits - team does not feel patient would be compliant with oral antibiotics.  Managed Medi-Cal in place.    ADDENDUM at 1600:  Received call from [place] at [place] - discussed case and patient's current medical needs - Adela Lank requesting DCP initiate SNF referral although aware that patient not a likely candidate for placement.     Further follow-up needed: Will initiate SNF referral in [facility] and follow for accepting facilities.    Date: 04/27/2013  Time: 10:23    Electronically signed by:  Cleotilde Neer, RN, Clinical Case Manager  [phone]  [phone]

## 2013-04-27 NOTE — Nurse Assessment (Signed)
ASSESSMENT NOTE    Note Started: 04/27/2013, 08:59     Initial assessment completed and recorded in EMR.  Report received from night shift nurse and orders reviewed. NAD, denies SOB, chest pain. Pt c/o pain to lower back and flank pain. Plan of Care reviewed and appropriate, discussed with patient.  Golden Pop RN

## 2013-04-27 NOTE — Nurse Assessment (Signed)
ASSESSMENT NOTE    Note Started: 04/27/2013, 20:11     Initial assessment completed and recorded in EMR.  Report received from day shift nurse and orders reviewed. No s/s of acute distress. Plan of Care reviewed and appropriate, discussed with patient.  Wonda Cheng, RN RN

## 2013-04-27 NOTE — Allied Health Progress (Addendum)
Clinical Social Services-Brief Note    Per EMR note by Dr. Selena Batten on 04/26/13, pt is a 37yr old male, with who sustained a T12 burst fracture (hx fall 10/31/12 and 12/06/12) and underwent T12 laminectomy, T10-L2 posterior fusion by Dr. Selena Batten on 04/04/13. He was admitted on 04/17/13 for concern of possible wound infection but then left AMA on 04/18/13. He presented to the ED on 04/22/13 with a chief complaint of back pain that began months ago. Pt referred to Clinical Social Services by Mardi Mainland, NP for community resources and assistance in finding dispo/housing. Pt seen in room at bedside.     Pt reports he currently only gets GA three months a year. He gets food stamps. Pt currently not working, says he cannot work right now due to his back surgery. He thinks it will be a year before he can go back to work. Pt interested in Social Security Disability, but SW explained that pt would have to have a condition that would be there for a year or more and affect his functioning for work, to be eligible. SW asked pt if he had paid into SDI for short-term disability, but he said he has not.     SW provided pt w/[employer], Parole assistance programs, housing programs, including possible transitional housing for MH patients. Pt says he does not know his MH diagnosis, said something about EOP or triple ? SW also provided pt w/info for substance abuse treatment programs, though pt claims he has not used meth for quite awhile. (Pt tox screen shows meth positive 04/2013).     SW consulted w/Kristin Carey Bullocks, CM. She reports pt will probably not be eligible for ICP program due to his drug use.     Consulted w/Lori Rubye Oaks, NP, after seeing pt, to update her on resources provided. SW let her know pt denying meth use though positive drug screen, suggested medical team will have to discuss w/pt.    ASSESSMENT: Pt well-groomed, awake and interactive. Pt open and cooperative. Pt denying he uses meth now though latest tox screen shows meth  positive. Pt will probably not be eligible for any shelter programs due to his meth use. Pt appreciative of SW resources and capable of F/U. Pt will probably not be eligible for any shelters at [place] due to meth use.     PLAN: Pt to contact SW if further assistance needed.    Carl Mata  [phone]  [phone]

## 2013-04-27 NOTE — Progress Notes (Addendum)
DEPARTMENT OF NEUROLOGICAL SURGERY  WARD:  SPINE PROGRESS NOTE    04/27/2013 HD 5 POD: 23  S/P: T10-L2 Poster fusion and T12 Laminectomy     ID: Carl Mata is a 38yr old male, with who sustained a T12 burst fracture (hx fall 10/31/12 and 12/06/12) and underwent T12 laminectomy, T10-L2 posterior fusion by Dr. Selena Batten on 04/04/13. He was admitted on 04/17/13 for concern of possible wound infection but then left AMA on 04/18/13. He presented to the ED on 04/22/13 with a chief complaint of back pain that began months ago. Stated on 11/14 that he ran out of his MSContin 30mg  tabs that was prescribed with pain 8/0-10. Had one episode of urinary incontinence 04/21/13. Denied numbness or weakness of the BLE. Able to ambulate. No fevers at home. He had nausea but not vomiting.     INTERVAL SUMMARY:     NAE    SUBJECTIVE:   - Pain is better controlled. Reports currently 2/0-10 scale with current regime (at worst 7/0-10 prior to rx)  - Reports ambulating without problem. Goes outside, smoked today. Reminded not to.    OBJECTIVE:     Medications  Scheduled Medications  Acetaminophen (TYLENOL) Tablet 1,000 mg, ORAL, Q6H  Cefepime (MAXIPIME) IV 1 g, IV, Q6H Now, Last Rate: 1 g (04/27/13 0430)  Cyclobenzaprine (FLEXERIL) Tablet 10 mg, ORAL, Q8H  Docusate (COLACE) Capsule 100 mg, ORAL, BID  Heparin PF 5,000 units/0.5 mL Injection 5,000 Units, SUBCUTANEOUS, Q8H  Nicotine (NICODERM CQ) 21 mg/24 hr Patch 1 patch, Transdermal, Q24H Now  Nicotine patch REMOVAL 1 patch, Transdermal, Q24H Now  Sennosides (SENOKOT) Tablet 17.2 mg, ORAL, BID  Vancomycin (VANCOCIN) 1,250 mg in D5W 250 mL IVPB, IV, Q12H Now, Last Rate: 1,250 mg (04/27/13 0056)    Continuous Medications  NaCl 0.9% 1,000 mL with Potassium Chloride 20 mEq, , IV, CONTINUOUS, Last Rate: 20 mL/hr at 04/27/13 0757      PRN Medications  Acetaminophen (TYLENOL) Tablet 650 mg, ORAL, Q4H PRN  Bisacodyl (DULCOLAX) Suppository 10 mg, RECTALLY, Q24H PRN  Diazepam (VALIUM) Tablet 5 mg,  ORAL, Q8H PRN  Heparin (PF) 100 units/mL Flush Syringe 3 mL, Intercatheter, PRN  Lidocaine (L-M-X4) 4 % Cream, TOPICAL-site required, PRN  Lidocaine (XYLOCAINE) 10 mg/mL (1%) Injection 0.1-2 mL, INTRADERMAL, PRN  Lidocaine cream REMOVAL, TOPICAL-site required, PRN  Magnesium Hydroxide (MILK OF MAGNESIA) 400 mg/5 mL Suspension 30 mL, ORAL, Q12H PRN  Oxycodone (ROXICODONE) Tablet 10-20 mg, ORAL, Q4H PRN      Current Vital Signs  BP 96/62  Pulse 72  Temp(Src) 36.5 C (97.7 F) (Oral)  Resp 12  Ht 1.727 m (5\' 8" )  Wt 65 kg (143 lb 4.8 oz)  BMI 21.79 kg/m2  SpO2 99%    24 Hour Vital Signs    Temp src:  [-]   Temp:  [36.5 C (97.7 F)-36.8 C (98.3 F)]   Pulse:  [72-92]   BP: (96-115)/(62-72)   Resp:  [12-18]   SpO2:  [99 %-100 %]     I/O Last 2 Completed Shifts:  In: 2440 [NUUV:2536; Crystalloid:1147]  Out: 3225 [Urine:3225]      Intake/Output Summary (Last 24 hours) at 04/27/13 0834  Last data filed at 04/27/13 0610   Gross per 24 hour   Intake   3955 ml   Output   2975 ml   Net    980 ml        General exam: Ambulating in hallway returning from walking outside and smoking. In  NAD.  Neuro Exam:   GCS: 15  Pupils: OU size: 3 mm, reaction: brisk  Cranial Nerves: II-XII intact   Motor exam: 5/5 thoughout  Sensory exam: Intact to PP throughout torso/LEs. Reports periincisional numbness present since OR.    Wound: Diminished erythema along wound. No fluctuance nor drainage. Non-tender. Small extruded stitch at midpoint on wound trimmed.     Labs:  CRP: 1.6 (11/18), 1.4 (11/16), 3.1 (11/14)  ESR: 46 (11/18, 44 (11/16), 77 (11/14)    Cultures:   11/9 blood cultures final: no growth    PLAN:   Neuro: S/P superficial wound infection  - Pain control on oral medications  - Vanc q12hrs + Cefepime q6hrs per ID day 6/14  - Serial neuro exams    Pulmonary: Stable on RA  CV: Stable  ID: Cellulitis/superficial wound infection, on abx as above per ID. Vanc trough 11 (goal 10-15). Asked ID about oral antibiotic alternatives. They  advised Levofloxacin 750 mg PO daily and Doxycycline 100 mg PO BID for remaining days of 14-day antibiotic regime (plan to d/c PICC and switch to PO once dispo is clarified, if within 14-day period).  F/E/N: NS TKO  GI: Regular diet, routine bowel care, last BM 11/18.  GU: Voids spont. No incontinence.  DVT: SCDs, SQH  Lines: Rt PICC 11/15, PIVs  Misc: Staples removed.   Dispo: Reports housing situation is not established yet. Still looking. Social services consult requested to assist. Plan to continue IV abx until dispo ID'd. If dispo ID'd prior to 14-day course, will d/c PICC and switch remaining days of therapy to PO recs.     Report electronically signed by:  Daleen Squibb, PhD, RN, NP-BC  Nurse Practitioner III  Department of Neurological Surgery  [phone], Page [phone]     I agree with the assessment and plan as outlined in the note.  Report electronically signed by Fernande Bras, MD. Attending

## 2013-04-28 NOTE — Progress Notes (Addendum)
DEPARTMENT OF NEUROLOGICAL SURGERY  WARD PROGRESS NOTE          Date of service: 04/28/2013    POD: 24        S/P: T10-L2 Poster fusion and T12 Laminectomy       ID: Carl Mata is a 37yr old male, with who sustained a T12 burst fracture (hx fall 10/31/12 and 12/06/12) and underwent T12 laminectomy, T10-L2 posterior fusion by Dr. Selena Batten on 04/04/13. He was admitted on 04/17/13 for concern of possible wound infection but then left AMA on 04/18/13. He presented to the ED on 04/22/13 with a chief complaint of back pain that began months ago. Stated on 11/14 that he ran out of his MSContin 30mg  tabs that was prescribed with pain 8/0-10. Had one episode of urinary incontinence 04/21/13. Denied numbness or weakness of the BLE. Able to ambulate. No fevers at home. He had nausea but not vomiting.     INTERVAL SUMMARY:  -Continue ABx  -NAE    OBJECTIVE:     Medications  Acetaminophen (TYLENOL) Tablet 1,000 mg, ORAL, Q6H  Cefepime (MAXIPIME) IV 1 g, IV, Q6H Now, Last Rate: 1 g (04/28/13 0500)  Cyclobenzaprine (FLEXERIL) Tablet 10 mg, ORAL, Q8H  Docusate (COLACE) Capsule 100 mg, ORAL, BID  Heparin PF 5,000 units/0.5 mL Injection 5,000 Units, SUBCUTANEOUS, Q8H  Nicotine (NICODERM CQ) 21 mg/24 hr Patch 1 patch, Transdermal, Q24H Now  Nicotine patch REMOVAL 1 patch, Transdermal, Q24H Now  Sennosides (SENOKOT) Tablet 17.2 mg, ORAL, BID  Vancomycin (VANCOCIN) 1,250 mg in D5W 250 mL IVPB, IV, Q12H Now, Last Rate: 1,250 mg (04/28/13 0107)      IV  NaCl 0.9% 1,000 mL with Potassium Chloride 20 mEq, , IV, CONTINUOUS, Last Rate: 20 mL/hr at 04/28/13 0815        PRNs  Acetaminophen (TYLENOL) Tablet 650 mg, ORAL, Q4H PRN  Bisacodyl (DULCOLAX) Suppository 10 mg, RECTALLY, Q24H PRN  Diazepam (VALIUM) Tablet 5 mg, ORAL, Q8H PRN  Heparin (PF) 100 units/mL Flush Syringe 3 mL, Intercatheter, PRN  Lidocaine (L-M-X4) 4 % Cream, TOPICAL-site required, PRN  Lidocaine (XYLOCAINE) 10 mg/mL (1%) Injection 0.1-2 mL, INTRADERMAL, PRN  Lidocaine  cream REMOVAL, TOPICAL-site required, PRN  Magnesium Hydroxide (MILK OF MAGNESIA) 400 mg/5 mL Suspension 30 mL, ORAL, Q12H PRN  Oxycodone (ROXICODONE) Tablet 10-20 mg, ORAL, Q4H PRN        Current Vital Signs  Temp: 36.9 C (98.4 F) (11/20 1200)  Temp src: Oral (11/20 1200)  Pulse: 82 (11/20 1200)  BP: 108/65 mmHg (11/20 1200)  Resp: 14 (11/20 1300)  SpO2: 98 % (11/20 1200)  Height: 172.7 cm (5\' 8" ) (11/14 2051)  Weight: 65 kg (143 lb 4.8 oz) (11/19 0126)    24 Hour Vital Signs    Temp src:  [-]   Temp:  [36.6 C (97.9 F)-37.2 C (99 F)]   Pulse:  [70-88]   BP: (99-108)/(58-66)   Resp:  [12-18]   SpO2:  [98 %-100 %]     IVF Total Hourly Rate: SL   Diet:           Regular    I/O Last 2 Completed Shifts:  In: 4782 [NFAO:1308; Crystalloid:1089]  Out: 2600 [Urine:2600]    PHYSICAL EXAM:  General Appearance: alert, no distress, pleasant affect, cooperative.   Neuro Exam:   GCS: Eyes: 4 Verbal: 5 Motor: 6  Pupils: OD size: 3 mm, reaction: brisk             OS  size: 3 mm, reaction: brisk  Motor:    Deltoid  Biceps  Triceps  Wrist Flexion  Wrist Extension  Hand Intrinsics    Right  5 5 5 5 5 5    Left  5 5 5 5 5 5       Hip Flexion  Knee Extension  Knee Flexion  Dorsiflexion  Great Toe Extension  Plantar Flexion    Right  5 5 5 5 5 5    Left  5 5 5 5 5 5      Sensory: Grossly intact        INCISIONS: Lumbar incision CDI. No drainage.  Wound Drain(s): none    Labs:  Latest Chem--7 with Mg    No results found for this basename: NA,K,CL,CO2,BUN,CR,GLU,MG in the last 48 hours  Latest CBC    No results found for this basename: WBC,HGB,HCT,PLT in the last 48 hours  Latest INR / APTT    No results found for this basename: INR,APTT in the last 48 hours  ABG Last 24 hours   No results found for this basename: ARTPO2:*,ARTO2SAT:*,ARTPCO2:*,ARTPH:*,ARTHCO3:*,ARTBE:* in the last 24 hours    Cultures: NGTD    PLAN:   Neuro: superficial wound infection  - Pain control on oral medications  - Vanc q12hrs + Cefepime q6hrs 7/14 ABx  - Serial  neuro exams  Pulmonary: Stable on RA  CV: Stable  ID: Cellulitis/superficial wound infection, on abx as above per ID. They advised Levofloxacin 750 mg PO daily and Doxycycline 100 mg PO BID for remaining days of 14-day antibiotic regime (plan to d/c PICC and switch to PO once dispo is clarified, if within 14-day period).  F/E/N: NS TKO  GI: Regular diet, routine bowel care, last BM 11/18.  GU: Voids spont. No incontinence.  DVT: SCDs, SQH  Lines: Rt PICC 11/15, PIVs  Misc: Staples removed.   Dispo: Reports housing situation is not established yet. Still looking. Social services consult requested to assist. Plan to continue IV abx until dispo ID'd. If dispo ID'd prior to 14-day course, will d/c PICC and switch remaining days of therapy to PO recs.       Dispo: Home when appropriate      Sonny Masters ACNP-BC  Nurse Practitioner  Department of Neurological Surgery  PI#: [phone]  Pager: [phone]    This patient was seen, evaluated, and care plan was developed.  I agree with the assessment and plan as outlined in the note.  Report electronically signed by Fernande Bras, MD. Attending

## 2013-04-28 NOTE — Nurse Assessment (Signed)
ASSESSMENT NOTE    Note Started: 04/28/2013, 20:11     Initial assessment completed and recorded in EMR.  Report received from day shift nurse and orders reviewed. No s/s of acute distress noted. Plan of Care reviewed and appropriate, discussed with patient.  Wonda Cheng, RN RN

## 2013-04-28 NOTE — Nurse Assessment (Signed)
ASSESSMENT NOTE    Note Started: 04/28/2013, 10:40     Initial assessment completed and recorded in EMR.  Report received from night shift nurse and orders reviewed. Plan of Care reviewed and appropriate, discussed with patient.  Golden Pop RN

## 2013-04-29 ENCOUNTER — Inpatient Hospital Stay: Payer: MEDICAID

## 2013-04-29 NOTE — Nurse Assessment (Signed)
ASSESSMENT NOTE      Note Started: 04/29/2013, 20:00     Initial assessment completed and recorded in EMR.  Report received from day shift nurse and orders reviewed. Plan of Care reviewed and appropriate, discussed with patient.  Burnetta Sabin, RN

## 2013-04-29 NOTE — Progress Notes (Addendum)
DEPARTMENT OF NEUROLOGICAL SURGERY  WARD PROGRESS NOTE          Date of service: 04/29/2013    POD: 25        S/P: T10-L2 Poster fusion and T12 Laminectomy       ID: Carl Mata is a 37yr old male, with who sustained a T12 burst fracture (hx fall 10/31/12 and 12/06/12) and underwent T12 laminectomy, T10-L2 posterior fusion by Dr. Selena Batten on 04/04/13. He was admitted on 04/17/13 for concern of possible wound infection but then left AMA on 04/18/13. He presented to the ED on 04/22/13 with a chief complaint of back pain that began months ago. Stated on 11/14 that he ran out of his MSContin 30mg  tabs that was prescribed with pain 8/0-10. Had one episode of urinary incontinence 04/21/13. Denied numbness or weakness of the BLE. Able to ambulate. No fevers at home. He had nausea but not vomiting.     INTERVAL SUMMARY:  -Continue ABx  -Some c/o pain but good functional status. No changes to pain regimen.    OBJECTIVE:     Medications  Acetaminophen (TYLENOL) Tablet 1,000 mg, ORAL, Q6H  Cefepime (MAXIPIME) IV 1 g, IV, Q6H Now, Last Rate: 1 g (04/29/13 0419)  Cyclobenzaprine (FLEXERIL) Tablet 10 mg, ORAL, Q8H  Docusate (COLACE) Capsule 100 mg, ORAL, BID  Heparin PF 5,000 units/0.5 mL Injection 5,000 Units, SUBCUTANEOUS, Q8H  Nicotine (NICODERM CQ) 21 mg/24 hr Patch 1 patch, Transdermal, Q24H Now  Nicotine patch REMOVAL 1 patch, Transdermal, Q24H Now  Sennosides (SENOKOT) Tablet 17.2 mg, ORAL, BID  Vancomycin (VANCOCIN) 1,250 mg in D5W 250 mL IVPB, IV, Q12H Now, Last Rate: Stopped (04/29/13 0130)      IV  NaCl 0.9% 1,000 mL with Potassium Chloride 20 mEq, , IV, CONTINUOUS, Last Rate: 20 mL/hr at 04/29/13 0755        PRNs  Acetaminophen (TYLENOL) Tablet 650 mg, ORAL, Q4H PRN  Bisacodyl (DULCOLAX) Suppository 10 mg, RECTALLY, Q24H PRN  Diazepam (VALIUM) Tablet 5 mg, ORAL, Q8H PRN  Heparin (PF) 100 units/mL Flush Syringe 3 mL, Intercatheter, PRN  Lidocaine (L-M-X4) 4 % Cream, TOPICAL-site required, PRN  Lidocaine (XYLOCAINE)  10 mg/mL (1%) Injection 0.1-2 mL, INTRADERMAL, PRN  Lidocaine cream REMOVAL, TOPICAL-site required, PRN  Magnesium Hydroxide (MILK OF MAGNESIA) 400 mg/5 mL Suspension 30 mL, ORAL, Q12H PRN  Oxycodone (ROXICODONE) Tablet 10-20 mg, ORAL, Q4H PRN        Current Vital Signs  Temp: 36.5 C (97.7 F) (11/21 0700)  Temp src: Oral (11/21 0700)  Pulse: 82 (11/21 0700)  BP: 96/61 mmHg (11/21 0700)  Resp: 14 (11/21 0700)  SpO2: 98 % (11/21 0700)  Height: 172.7 cm (5\' 8" ) (11/14 2051)  Weight: 65 kg (143 lb 4.8 oz) (11/19 0126)    24 Hour Vital Signs    Temp src:  [-]   Temp:  [36.5 C (97.7 F)-36.9 C (98.4 F)]   Pulse:  [71-95]   BP: (96-113)/(61-73)   Resp:  [14-18]   SpO2:  [98 %-99 %]     IVF Total Hourly Rate: SL   Diet:           Regular    I/O Last 2 Completed Shifts:  In: 56 [Oral:3910; Crystalloid:996]  Out: 2650 [Urine:2650]    PHYSICAL EXAM:  General Appearance: alert, no distress, pleasant affect, cooperative.   Neuro Exam:   GCS: Eyes: 4 Verbal: 5 Motor: 6  Pupils: OD size: 3 mm, reaction: brisk  OS size: 3 mm, reaction: brisk  Motor:    Deltoid  Biceps  Triceps  Wrist Flexion  Wrist Extension  Hand Intrinsics    Right  5 5 5 5 5 5    Left  5 5 5 5 5 5       Hip Flexion  Knee Extension  Knee Flexion  Dorsiflexion  Great Toe Extension  Plantar Flexion    Right  5 5 5 5 5 5    Left  5 5 5 5 5 5      Sensory: Grossly intact        INCISIONS: Lumbar incision CDI. No drainage.  Wound Drain(s): none    Labs:  Latest Chem--7 with Mg    No results found for this basename: NA,K,CL,CO2,BUN,CR,GLU,MG in the last 48 hours  Latest CBC    No results found for this basename: WBC,HGB,HCT,PLT in the last 48 hours  Latest INR / APTT    No results found for this basename: INR,APTT in the last 48 hours  ABG Last 24 hours   No results found for this basename: ARTPO2:*,ARTO2SAT:*,ARTPCO2:*,ARTPH:*,ARTHCO3:*,ARTBE:* in the last 24 hours    Cultures: NGTD    PLAN:   Neuro: superficial wound infection  - Pain control on oral  medications  - Vanc q12hrs + Cefepime q6hrs 8/14 ABx  - Serial neuro exams  Pulmonary: Stable on RA  CV: Stable  ID: Cellulitis/superficial wound infection, on abx as above per ID. They advised Levofloxacin 750 mg PO daily and Doxycycline 100 mg PO BID for remaining days of 14-day antibiotic regime (plan to d/c PICC and switch to PO once dispo is clarified, if within 14-day period).  F/E/N: NS TKO  GI: Regular diet, routine bowel care, last BM 11/18.  GU: Voids spont. No incontinence.  DVT: SCDs, SQH  Lines: Rt PICC 11/15, PIVs  Misc: Staples removed.   Dispo: Reports housing situation is not established yet. Still looking. Social services consult requested to assist. Plan to continue IV abx until dispo ID'd. If dispo ID'd prior to 14-day course, will d/c PICC and switch remaining days of therapy to PO recs.       Dispo: Home when appropriate      Sonny Masters ACNP-BC  Nurse Practitioner  Department of Neurological Surgery  PI#: [phone]  Pager: [phone]    I agree with the assessment and plan as outlined in the resident's note.  Report electronically signed by Fernande Bras, MD. Attending

## 2013-04-29 NOTE — Allied Health Progress (Addendum)
RODRIQUES BADIE MRN: [id number]      Clinical Care Management Progress Note    Comments: Patient remains in house for postop wound infection - receiving IV antibiotics - homeless/parolee/psych hx/meth use.  Managed Medi-Cal in place.  No accepting SNF's via [facility] - tasked DCP assistant Amit/Laura to follow.    Further follow-up needed: Will continue to follow discharge disposition.    Date: 04/29/2013  Time: 10:05    Electronically signed by:  Cleotilde Neer, RN, Clinical Case Manager  [phone]  [phone]

## 2013-04-29 NOTE — Progress Notes (Addendum)
DEPARTMENT OF NEUROLOGICAL SURGERY  WARD PROGRESS NOTE          Date of service: 04/30/2013    POD: 26        S/P: T10-L2 Poster fusion and T12 Laminectomy       ID: Carl Mata is a 37yr old male, with who sustained a T12 burst fracture (hx fall 10/31/12 and 12/06/12) and underwent T12 laminectomy, T10-L2 posterior fusion by Dr. Selena Batten on 04/04/13. He was admitted on 04/17/13 for concern of possible wound infection but then left AMA on 04/18/13. He presented to the ED on 04/22/13 with a chief complaint of back pain that began months ago. Stated on 11/14 that he ran out of his MSContin 30mg  tabs that was prescribed with pain 8/0-10. Had one episode of urinary incontinence 04/21/13. Denied numbness or weakness of the BLE. Able to ambulate. No fevers at home. He had nausea but not vomiting.     INTERVAL SUMMARY:  - Continue ABx  - Wound appears intact and less erythematous.   - Right hip point tenderness, Films ordered.   - ESR 48, CRP 1.3    OBJECTIVE:     Medications  Acetaminophen (TYLENOL) Tablet 1,000 mg, ORAL, Q6H  Cefepime (MAXIPIME) IV 1 g, IV, Q6H Now, Last Rate: 0 g (04/29/13 1200)  Cyclobenzaprine (FLEXERIL) Tablet 10 mg, ORAL, Q8H  Docusate (COLACE) Capsule 100 mg, ORAL, BID  Heparin PF 5,000 units/0.5 mL Injection 5,000 Units, SUBCUTANEOUS, Q8H  Nicotine (NICODERM CQ) 21 mg/24 hr Patch 1 patch, Transdermal, Q24H Now  Nicotine patch REMOVAL 1 patch, Transdermal, Q24H Now  Sennosides (SENOKOT) Tablet 17.2 mg, ORAL, BID  Vancomycin (VANCOCIN) 1,250 mg in D5W 250 mL IVPB, IV, Q12H Now, Last Rate: 0 mg (04/29/13 1315)      IV  NaCl 0.9% 1,000 mL with Potassium Chloride 20 mEq, , IV, CONTINUOUS, Last Rate: 20 mL/hr at 04/30/13 0981        PRNs  Acetaminophen (TYLENOL) Tablet 650 mg, ORAL, Q4H PRN  Bisacodyl (DULCOLAX) Suppository 10 mg, RECTALLY, Q24H PRN  Diazepam (VALIUM) Tablet 5 mg, ORAL, Q8H PRN  Heparin (PF) 100 units/mL Flush Syringe 3 mL, Intercatheter, PRN  Lidocaine (L-M-X4) 4 % Cream,  TOPICAL-site required, PRN  Lidocaine (XYLOCAINE) 10 mg/mL (1%) Injection 0.1-2 mL, INTRADERMAL, PRN  Lidocaine cream REMOVAL, TOPICAL-site required, PRN  Magnesium Hydroxide (MILK OF MAGNESIA) 400 mg/5 mL Suspension 30 mL, ORAL, Q12H PRN  Oxycodone (ROXICODONE) Tablet 10-20 mg, ORAL, Q4H PRN        Current Vital Signs  Temp: 36.8 C (98.3 F) (11/22 0455)  Temp src: Oral (11/22 0455)  Pulse: 83 (11/22 0455)  BP: 96/56 mmHg (11/22 0455)  Resp: 18 (11/22 0455)  SpO2: 99 % (11/22 0455)  Height: 172.7 cm (5\' 8" ) (11/14 2051)  Weight: 65 kg (143 lb 4.8 oz) (11/19 0126)    24 Hour Vital Signs    Temp src:  [-]   Temp:  [36.4 C (97.5 F)-36.8 C (98.3 F)]   Pulse:  [82-86]   BP: (96-121)/(56-74)   Resp:  [14-18]   SpO2:  [97 %-99 %]     IVF Total Hourly Rate: SL   Diet:           Regular    I/O Last 2 Completed Shifts:  In: 2582.3 [Oral:1430; Crystalloid:1152.3]  Out: -     PHYSICAL EXAM:  General Appearance: alert, NAD  Neuro Exam:   GCS: Eyes: 4 Verbal: 5 Motor: 6  Pupils: OD  size: 3 mm, reaction: brisk             OS size: 3 mm, reaction: brisk  Motor:    Deltoid  Biceps  Triceps  Wrist Flexion  Wrist Extension  Hand Intrinsics    Right  5 5 5 5 5 5    Left  5 5 5 5 5 5       Hip Flexion  Knee Extension  Knee Flexion  Dorsiflexion  Great Toe Extension  Plantar Flexion    Right  5 5 5 5 5 5    Left  5 5 5 5 5 5      Sensory: Grossly intact        INCISIONS: Lumbar incision CDI. No drainage. Decreasing erythema.     Labs:  Latest Chem--7 with Mg    No results found for this basename: NA,K,CL,CO2,BUN,CR,GLU,MG in the last 48 hours  Latest CBC    No results found for this basename: WBC,HGB,HCT,PLT in the last 48 hours  Latest INR / APTT    No results found for this basename: INR,APTT in the last 48 hours  ABG Last 24 hours   No results found for this basename: ARTPO2:*,ARTO2SAT:*,ARTPCO2:*,ARTPH:*,ARTHCO3:*,ARTBE:* in the last 24 hours    Cultures: NGTD    PLAN:   Neuro: superficial wound infection  - Pain control on oral  medications  - Vanc q12hrs + Cefepime q6hrs 9/14 ABx  - Serial neuro exams  - Right pelvic films pending for TTP on right hip.   Pulmonary: Stable on RA  CV: Stable  ID: Cellulitis/superficial wound infection, on abx as above per ID. They advised Levofloxacin 750 mg PO daily and Doxycycline 100 mg PO BID for remaining days of 14-day antibiotic regime (plan to d/c PICC and switch to PO once dispo is clarified, if within 14-day period).  F/E/N: NS TKO  GI: Regular diet, routine bowel care, last BM 11/18.  GU: Voids spont. No incontinence.  DVT: SCDs, SQH  Lines: Rt PICC 11/15, PIVs  Misc: Staples removed.   Dispo: Reports housing situation is not established yet. Still looking. Social services consult requested to assist. Plan to continue IV abx until dispo ID'd. If dispo ID'd prior to 14-day course, will d/c PICC and switch remaining days of therapy to PO recs.      Dispo: Home when appropriate    Risa Grill  M.D.  Neurosurgery PGY1  Service Pager: [phone]  Personal Pager: [phone]  PI: [phone]    I agree with the assessment and plan as outlined in the resident's note.  Report electronically signed by Fernande Bras, MD. Attending

## 2013-04-29 NOTE — Allied Health Progress (Signed)
Carl Mata MRN: [id number]      Clinical Care Management Progress Note    Comments: Received voicemail message from [facility] - inpatient stay to be denied from 04/27/13 - forward as patient not meeting inpatient criteria, peer to peer needed?  Discussed denial with neurosurgery NP - Theron Arista.  NP Theron Arista states no peer to peer needed, informed Theron Arista too late in the day for shelter referral and shelter does not accept patients over the weekend.  Peter to discuss with team/patient and formulate discharge plan.    Further follow-up needed:  No further discharge planning needs identified.    Date: 04/29/2013  Time: 15:35    Electronically signed by:  Cleotilde Neer, RN, Clinical Case Manager  [phone]  [phone]

## 2013-04-29 NOTE — Nurse Assessment (Signed)
ASSESSMENT NOTE    Note Started: 04/29/2013, 07:55     Initial assessment completed and recorded in EMR.  Report received from day shift nurse and orders reviewed. Plan of Care reviewed and appropriate, discussed with patient.  Lendon Ka, RN RN

## 2013-04-29 NOTE — Discharge Instructions (Signed)
 Discharge Instructions    Medication    Take you medication as directed.  Do not take over the counter medications unless your doctor says it is okay.  Do not take aspirin, ibuprofen or other NSAIDs (non-steroidal anti inflammatory drug) unless you doctor says it is okay.    At Home    Wear your brace if you have one.    You may shower 2-3 days after surgery.  If the incision gets wet use a clean, dry towel to pat it dry. The incision may be left open to air.    Gradually, you will be able to get more active.   Talk to your doctor about when and how to return to the following activities:  Driving.   Lifting heavy objects   Returning to work.    Follow-up Care    If you have sutures or staples, you will need to have them removed 7-14 days after your surgery.  Please make a clinic appointment to get these removed.  Your doctor will schedule one or more follow-up visits with you. If you had fusion, X-rays may be taken. Your doctor may also evaluate nerve function and arm strength if you had arm or hand pain, numbness, or weakness before your surgery. Once your neck is sufficiently healed, your doctor may recommend exercises or physical therapy to help strengthen your neck.  Please call your physicians clinic within 48 hours of discharge to set up a follow-up appointment if one has not been scheduled.    Activity  Arrange your household to keep the items you need within reach.  Remove electrical cords, throw rugs and anything else that may cause you to fall.  Free up your hands so that you can use them to keep balance.  Use a fanny pack, apron or pockets to carry things.  Be sure not to carry too much at once.  Don't lift anything heavier than 5 pounds until your next doctors appointment.  Ask your physician when it would be okay to increase the amount of weight you will be able to lift.   Don't drive until your doctor says it is okay.  And never drive while you are taking narcotic pain medications.  Nap if you are  tired, but don't stay in bed all day.    When to Seek Medical Attention  Call 911 right away if you have any of the following:  Chest pain  Shortness of breath  A severe headache  Trouble controlling your bowels or bladder  Signs of stroke.   Know the signs of a stroke: Know the F.A.S.T. test to recognize the signs of a stroke:   F = Face: Ask the person to smile. Drooping on one side of the mouth or face is a sign of a stroke.     A = Arms: Ask the person to raise both arms. One arm that slowly comes back down or cannot be raised is a sign of a stroke.     S = Speech: Ask the person to repeat a simple sentence that you say first. Speech that is slurred or sounds strange is a sign of a stroke.    T = Time: Call 911 if you see any of these signs. This is an emergency.    Otherwise, call your doctor immediately if you have any of the following:  Increased pain, redness, swelling or drainage from the incision  Fever above 100.84F or shaking chills  New pain, weakness, warmth, or  numbness in your legs  Foot, ankle, or calf swelling that is not relieved by elevating your feet

## 2013-04-30 ENCOUNTER — Inpatient Hospital Stay (HOSPITAL_COMMUNITY): Payer: MEDICAID

## 2013-04-30 MED ORDER — ALTEPLASE 2 MG INTRA-CATHETER SOLUTION
2.0000 mg | Status: DC | PRN
Start: 2013-04-30 — End: 2013-05-06
  Filled 2013-04-30: qty 1

## 2013-04-30 NOTE — Nurse Assessment (Signed)
ASSESSMENT NOTE    Note Started: 04/30/2013       Assumed care of patient, report received from night shift nurse and orders reviewed. Patient seen, no signs of distress noted. Plan of Care reviewed and appropriate, discussed with patient. Will continue with pain control and wound care/IV ABX.    Wyonia Hough RN

## 2013-05-01 NOTE — Progress Notes (Addendum)
DEPARTMENT OF NEUROLOGICAL SURGERY  [facility] PROGRESS NOTE          Date of service: 05/01/2013    POD: 27     S/P: T10-L2 Poster fusion and T12 Laminectomy     INTERVAL SUMMARY:  - Continue antibiotics   - Pain control  - Hip pain improved. No acute osseous abnormality of the pelvis or right hip on xray    OBJECTIVE:     Medications  Acetaminophen (TYLENOL) Tablet 1,000 mg, ORAL, Q6H  Cefepime (MAXIPIME) IV 1 g, IV, Q6H Now, Last Rate: 1 g (05/01/13 1049)  Cyclobenzaprine (FLEXERIL) Tablet 10 mg, ORAL, Q8H  Docusate (COLACE) Capsule 100 mg, ORAL, BID  Heparin PF 5,000 units/0.5 mL Injection 5,000 Units, SUBCUTANEOUS, Q8H  Nicotine (NICODERM CQ) 21 mg/24 hr Patch 1 patch, Transdermal, Q24H Now  Nicotine patch REMOVAL 1 patch, Transdermal, Q24H Now  Sennosides (SENOKOT) Tablet 17.2 mg, ORAL, BID  Vancomycin (VANCOCIN) 1,250 mg in D5W 250 mL IVPB, IV, Q12H Now, Last Rate: Stopped (05/01/13 0230)      IV  NaCl 0.9% 1,000 mL with Potassium Chloride 20 mEq, , IV, CONTINUOUS, Last Rate: 20 mL/hr at 05/01/13 0523        PRNs  Alteplase Catheter Clearance (CATHFLO ACTIVASE) 2 mg, Intercatheter, PRN  Bisacodyl (DULCOLAX) Suppository 10 mg, RECTALLY, Q24H PRN  Diazepam (VALIUM) Tablet 5 mg, ORAL, Q8H PRN  Heparin (PF) 100 units/mL Flush Syringe 3 mL, Intercatheter, PRN  Lidocaine (L-M-X4) 4 % Cream, TOPICAL-site required, PRN  Lidocaine (XYLOCAINE) 10 mg/mL (1%) Injection 0.1-2 mL, INTRADERMAL, PRN  Lidocaine cream REMOVAL, TOPICAL-site required, PRN  Magnesium Hydroxide (MILK OF MAGNESIA) 400 mg/5 mL Suspension 30 mL, ORAL, Q12H PRN  Oxycodone (ROXICODONE) Tablet 10-20 mg, ORAL, Q4H PRN        Current Vital Signs  Temp: 36.5 C (97.7 F) (11/23 0745)  Temp src: Oral (11/23 0745)  Pulse: 74 (11/23 0745)  BP: 102/60 mmHg (11/23 0745)  Resp: 16 (11/23 0745)  SpO2: 99 % (11/23 0745)  Height: 172.7 cm (5\' 8" ) (11/14 2051)  Weight: 65 kg (143 lb 4.8 oz) (11/19 0126)    24 Hour Vital Signs    Temp src:  [-]   Temp:  [36.4 C (97.6  F)-36.6 C (97.9 F)]   Pulse:  [74-85]   BP: (101-113)/(60-71)   Resp:  [16]   SpO2:  [99 %-100 %]     IVF Total Hourly Rate: SL   Diet:           Regular    I/O Last 2 Completed Shifts:  In: 3260 [Oral:2210; Crystalloid:1050]  Out: -     PHYSICAL EXAM:    Neuro Exam:   GCS: 15  Pupils: OD size: 3 mm, reaction: brisk   OS size: 3 mm, reaction: brisk  Cranial Nerves: 2-12 intact   MAE 5/5 in all extremities  Stable paresthesias in the lateral thighs and legs since post op.     Wound: incision well approximate. Erythema decreased.     Labs:  Latest Chem--7 with Mg    No results found for this basename: NA,K,CL,CO2,BUN,CR,GLU,MG in the last 48 hours  Latest CBC    No results found for this basename: WBC,HGB,HCT,PLT in the last 48 hours  Latest INR / APTT    No results found for this basename: INR,APTT in the last 48 hours  ABG Last 24 hours   No results found for this basename: ARTPO2:*,ARTO2SAT:*,ARTPCO2:*,ARTPH:*,ARTHCO3:*,ARTBE:* in the last 24 hours  Cultures: NGTD    PLAN:   Neuro: superficial wound infection  - Pain control on oral medications  - Vanc q12hrs + Cefepime q6hrs Day 10/14   - Serial neuro exams    Pulmonary: Stable on RA  CV: Stable  ID: Cellulitis/superficial wound infection, on abx as above per ID. They advised Levofloxacin 750 mg PO daily and Doxycycline 100 mg PO BID for remaining days of 14-day antibiotic regime (plan to d/c PICC and switch to PO once dispo is clarified, if within 14-day period).    F/E/N: NS TKO  GI: Regular diet, routine bowel care, last BM 11/18.  GU: Voids spont. No incontinence.  DVT: SCDs, SQH  Lines: Rt PICC 11/15, PIVs  Misc: Staples removed.   Dispo: Reports housing situation is not established yet. Still looking. Social services consult requested to assist. Plan to continue IV abx until dispo ID'd. If dispo ID'd prior to 14-day course, will d/c PICC and switch remaining days of therapy to PO recs.      Dispo: Home when appropriate    Risa Grill   M.D.  Neurosurgery PGY1  Service Pager: [phone]  Personal Pager: [phone]  PI: [phone]    I agree with the assessment and plan as outlined in the resident's note.  Report electronically signed by Fernande Bras, MD. Attending

## 2013-05-01 NOTE — Nurse Assessment (Signed)
ASSESSMENT NOTE    Note Started: 05/01/2013, 21:57     Initial assessment completed and recorded in EMR.  Report received from day shift nurse and orders reviewed. Plan of Care reviewed and appropriate, discussed with patient.  Jimmy Footman, RN RN

## 2013-05-01 NOTE — Nurse Assessment (Signed)
ASSESSMENT NOTE    Note Started: 05/01/2013, 07:46     Initial assessment completed and recorded in EMR.  Report received from night shift nurse and orders reviewed. Plan of Care reviewed and appropriate, discussed with patient.  Jacqulyn Cane, Product manager

## 2013-05-02 LAB — C-REACTIVE PROTEIN: C-Reactive Protein: 1.5 mg/dL — ABNORMAL HIGH (ref 0–0.8)

## 2013-05-02 NOTE — Nurse Assessment (Signed)
ASSESSMENT NOTE    Note Started: 05/02/2013, 20:16     Initial assessment completed and recorded in EMR.  Report received from day shift nurse and orders reviewed. Plan of Care reviewed and appropriate, discussed with patient.  Jimmy Footman, RN RN

## 2013-05-02 NOTE — Nurse Assessment (Signed)
ASSESSMENT NOTE    Note Started: 05/02/2013, 08:06     Initial assessment completed and recorded in EMR.  Report received from night shift nurse and orders reviewed. Plan of Care reviewed and appropriate, discussed with patient.  Jacqulyn Cane, Product manager

## 2013-05-02 NOTE — Progress Notes (Addendum)
DEPARTMENT OF NEUROLOGICAL SURGERY  [facility] PROGRESS NOTE          Date of service: 05/02/2013    POD: 28    S/P: T10-L2 Poster fusion and T12 Laminectomy     ID: Carl Mata is a 37yr old male, with who sustained a T12 burst fracture (hx fall 10/31/12 and 12/06/12) and underwent T12 laminectomy, T10-L2 posterior fusion by Dr. Selena Mata on 04/04/13. He was admitted on 04/17/13 for concern of possible wound infection but then left AMA on 04/18/13. He presented to the ED on 04/22/13 with a chief complaint of back pain that began months ago. Stated on 11/14 that he ran out of his MSContin 30mg  tabs that was prescribed with pain 8/0-10. Had one episode of urinary incontinence 04/21/13.     INTERVAL SUMMARY:  - Continue antibiotics   - Pain 2-8/0-10 scale    OBJECTIVE:     Medications  Acetaminophen (TYLENOL) Tablet 1,000 mg, ORAL, Q6H  Cefepime (MAXIPIME) IV 1 g, IV, Q6H Now, Last Rate: 1 g (05/02/13 0457)  Cyclobenzaprine (FLEXERIL) Tablet 10 mg, ORAL, Q8H  Docusate (COLACE) Capsule 100 mg, ORAL, BID  Heparin PF 5,000 units/0.5 mL Injection 5,000 Units, SUBCUTANEOUS, Q8H  Nicotine (NICODERM CQ) 21 mg/24 hr Patch 1 patch, Transdermal, Q24H Now  Nicotine patch REMOVAL 1 patch, Transdermal, Q24H Now  Sennosides (SENOKOT) Tablet 17.2 mg, ORAL, BID  Vancomycin (VANCOCIN) 1,250 mg in D5W 250 mL IVPB, IV, Q12H Now, Last Rate: 1,250 mg (05/01/13 1418)      IV  NaCl 0.9% 1,000 mL with Potassium Chloride 20 mEq, , IV, CONTINUOUS, Last Rate: 20 mL/hr at 05/02/13 1610      PRNs  Alteplase Catheter Clearance (CATHFLO ACTIVASE) 2 mg, Intercatheter, PRN  Bisacodyl (DULCOLAX) Suppository 10 mg, RECTALLY, Q24H PRN  Diazepam (VALIUM) Tablet 5 mg, ORAL, Q8H PRN  Heparin (PF) 100 units/mL Flush Syringe 3 mL, Intercatheter, PRN  Lidocaine (L-M-X4) 4 % Cream, TOPICAL-site required, PRN  Lidocaine (XYLOCAINE) 10 mg/mL (1%) Injection 0.1-2 mL, INTRADERMAL, PRN  Lidocaine cream REMOVAL, TOPICAL-site required, PRN  Magnesium Hydroxide (MILK OF  MAGNESIA) 400 mg/5 mL Suspension 30 mL, ORAL, Q12H PRN  Oxycodone (ROXICODONE) Tablet 10-20 mg, ORAL, Q4H PRN      Current Vital Signs  Temp: 36.8 C (98.2 F) (11/24 0800)  Temp src: Oral (11/24 0800)  Pulse: 93 (11/24 0800)  BP: 111/71 mmHg (11/24 0800)  Resp: 18 (11/24 0800)  SpO2: 97 % (11/24 0800)  Height: 172.7 cm (5\' 8" ) (11/14 2051)  Weight: 65 kg (143 lb 4.8 oz) (11/19 0126)    24 Hour Vital Signs    Temp src:  [-]   Temp:  [36.3 C (97.4 F)-36.8 C (98.2 F)]   Pulse:  [74-93]   BP: (105-116)/(62-77)   Resp:  [16-19]   SpO2:  [96 %-100 %]       I/O Last 2 Completed Shifts:  In: 2870 [Oral:2520; Crystalloid:350]  Out: -     PHYSICAL EXAM:  General exam: Resting comfortably in bed. Moves easily to reposition, get OOB.  Neuro Exam:   GCS: 15  Pupils: OU size: 3 mm, reaction: brisk  Cranial Nerves: II-XII intact   MAE 5/5 in all extremities  Sensation: grossly intact to LT/PP.  DTR low  Wound: incision well approximated. Erythema decreasing.     Labs:  Latest Chem--7 with Mg    No results found for this basename: NA,K,CL,CO2,BUN,CR,GLU,MG in the last 48 hours  Latest CBC  No results found for this basename: WBC,HGB,HCT,PLT in the last 48 hours  Latest INR / APTT    No results found for this basename: INR,APTT in the last 48 hours  ABG Last 24 hours   No results found for this basename: ARTPO2:*,ARTO2SAT:*,ARTPCO2:*,ARTPH:*,ARTHCO3:*,ARTBE:* in the last 24 hours    Cultures: Final - no growth    PLAN:   Neuro: superficial wound infection  - Pain control on oral medications  - Vanc q12hrs + Cefepime q6hrs Day 11/14   - Serial neuro exams    Pulmonary: Stable on RA  CV: Stable  ID: Cellulitis/superficial wound infection, on abx as above per ID. They advised Levofloxacin 750 mg PO daily and Doxycycline 100 mg PO BID for remaining days of 14-day antibiotic regime (plan to d/c PICC and switch to PO once dispo is clarified, if within 14-day period).  F/E/N: NS TKO  GI: Regular diet, routine bowel care, last BM  11/23.  GU: Voids spont. No incontinence.  DVT: SCDs, SQH (has been refusing, discussed risk/benefit and patient agrees to resume).  Lines: Rt PICC 11/15, PIVs  Dispo: [facility] Caseworker per patient: Mr. Carl Mata [phone] to coordinate/assist with dispo. Patient reports housing situation is not established yet. Plan to continue IV abx until dispo ID'd. If dispo ID'd prior to 14-day course, will d/c PICC and switch remaining days of therapy to PO recs.     Report electronically signed by:  Daleen Squibb, PhD, RN, NP-BC  Nurse Practitioner III  Department of Neurological Surgery  [phone], Page [phone]    I agree with the assessment and plan as outlined in the note.  Report electronically signed by Fernande Bras, MD. Attending

## 2013-05-02 NOTE — Nurse Assessment (Signed)
ASSESSMENT NOTE    Note Started: 05/02/2013, 08:21     Initial assessment completed and recorded in EMR.  Report received from night shift nurse and orders reviewed. Plan of Care reviewed and appropriate, discussed with patient.  Jacqulyn Cane, Product manager

## 2013-05-02 NOTE — Allied Health Progress (Signed)
NUTRITION SCREENING     Admission Date: 04/22/2013   Date of Service: 05/02/2013, 11:56     Patient continues to have good appetite, consuming 75-100% meals, averaged ~ 93% meals past 9 days.  PO intake adequate to meet nutrition needs.  Patient also receiving snacks via floor stock, RD will order scheduled snacks per patient request.  Labs and meds reviewed.  + BM 11/23.    Patient does not present at nutrition risk. No further nutrition intervention is warranted at this time. Will continue routine nutrition monitoring. Please consult Registered Dietitian if acute nutrition issues arise.    Report Electronically Signed By: Zeb Comfort, MS, RD, CDE pager [phone]

## 2013-05-02 NOTE — Allied Health Progress (Addendum)
Carl Mata MRN: [id number]      Clinical Care Management Progress Note    Comments:   Spoke with Carl Mata [phone] (caseworker for patient) late 04/29/13.  Carl Mata states only option at discharge is for patient to go to a shelter.  Informed neurosurgery intern and neurosurgery NP - Lawson Fiscal - that insurance is no longer covering hospitalization from 04/27/13 - forward, patient could be discharged on oral antibiotics.  Intern/NP to discuss with team -  Team anticipates patient will not be compliant with oral antibiotics and wants to keep him in house until IV antibiotics are completed ( today day 11/14).       Further follow-up needed: Will continue to follow discharge needs.    Date: 05/02/2013  Time: 13:31    Electronically signed by:  Cleotilde Neer, RN, Clinical Case Manager  [phone]  [phone]

## 2013-05-03 ENCOUNTER — Inpatient Hospital Stay: Payer: MEDICAID

## 2013-05-03 NOTE — Nurse Assessment (Signed)
ASSESSMENT NOTE    Note Started: 05/03/2013, 08:04     Initial assessment completed and recorded in EMR.  Report received from night shift nurse and orders reviewed. Pt A&Ox3, walking around unit. Pt requesting to be disconnected from IV tubing while walking. Pt educated that disconnecting poses potential risk for infection in his central line. Pt still insisting on being disconnected, saline locked PICC line. Plan of Care reviewed and appropriate, discussed with patient.  Tamera Punt ,RN

## 2013-05-03 NOTE — Progress Notes (Addendum)
DEPARTMENT OF NEUROLOGICAL SURGERY  [facility] PROGRESS NOTE          Date of service: 05/03/2013    POD: 29    S/P: T10-L2 Poster fusion and T12 Laminectomy     ID: Carl Mata is a 37yr old male, with who sustained a T12 burst fracture (hx fall 10/31/12 and 12/06/12) and underwent T12 laminectomy, T10-L2 posterior fusion by Dr. Selena Batten on 04/04/13. He was admitted on 04/17/13 for concern of possible wound infection but then left AMA on 04/18/13. He presented to the ED on 04/22/13 with a chief complaint of back pain that began months ago. Stated on 11/14 that he ran out of his MSContin 30mg  tabs that was prescribed with pain 8/0-10. Had one episode of urinary incontinence 04/21/13.     INTERVAL SUMMARY:  - Continue antibiotics   - Pain 0-8/0-10 scale, reports episode of muscle spasm in right flank which resolved. After prn meds.    OBJECTIVE:     Medications  Acetaminophen (TYLENOL) Tablet 1,000 mg, ORAL, Q6H  Cefepime (MAXIPIME) IV 1 g, IV, Q6H Now, Last Rate: 1 g (05/03/13 0451)  Cyclobenzaprine (FLEXERIL) Tablet 10 mg, ORAL, Q8H  Docusate (COLACE) Capsule 100 mg, ORAL, BID  Heparin PF 5,000 units/0.5 mL Injection 5,000 Units, SUBCUTANEOUS, Q8H  Nicotine (NICODERM CQ) 21 mg/24 hr Patch 1 patch, Transdermal, Q24H Now  Nicotine patch REMOVAL 1 patch, Transdermal, Q24H Now  Sennosides (SENOKOT) Tablet 17.2 mg, ORAL, BID  Vancomycin (VANCOCIN) 1,250 mg in D5W 250 mL IVPB, IV, Q12H Now, Last Rate: 1,250 mg (05/02/13 1404)      IV  NaCl 0.9% 1,000 mL with Potassium Chloride 20 mEq, , IV, CONTINUOUS, Last Rate: 20 mL/hr at 05/03/13 0451      PRNs  Alteplase Catheter Clearance (CATHFLO ACTIVASE) 2 mg, Intercatheter, PRN  Bisacodyl (DULCOLAX) Suppository 10 mg, RECTALLY, Q24H PRN  Diazepam (VALIUM) Tablet 5 mg, ORAL, Q8H PRN  Heparin (PF) 100 units/mL Flush Syringe 3 mL, Intercatheter, PRN  Lidocaine (L-M-X4) 4 % Cream, TOPICAL-site required, PRN  Lidocaine (XYLOCAINE) 10 mg/mL (1%) Injection 0.1-2 mL, INTRADERMAL,  PRN  Lidocaine cream REMOVAL, TOPICAL-site required, PRN  Magnesium Hydroxide (MILK OF MAGNESIA) 400 mg/5 mL Suspension 30 mL, ORAL, Q12H PRN  Oxycodone (ROXICODONE) Tablet 10-20 mg, ORAL, Q4H PRN      Current Vital Signs  Temp: 36.6 C (97.8 F) (11/25 0711)  Temp src: Oral (11/25 0711)  Pulse: 86 (11/25 0711)  BP: 108/72 mmHg (11/25 0711)  Resp: 16 (11/25 0711)  SpO2: 100 % (11/25 0711)  Height: 172.7 cm (5\' 8" ) (11/14 2051)  Weight: 65 kg (143 lb 4.8 oz) (11/19 0126)    24 Hour Vital Signs    Temp src:  [-]   Temp:  [36.4 C (97.5 F)-37 C (98.6 F)]   Pulse:  [78-95]   BP: (104-112)/(65-72)   Resp:  [16-17]   SpO2:  [98 %-100 %]       I/O Last 2 Completed Shifts:  In: 2250 [Oral:2250]  Out: -     PHYSICAL EXAM:  General exam: Resting comfortably in bed. Moves easily to reposition, get OOB.  Neuro Exam:   GCS: 15  Pupils: OU size: 3 mm, reaction: brisk  Cranial Nerves: II-XII intact   MAE 5/5 in all extremities  Sensation: grossly intact to LT/PP.  DTR low  Wound: incision well approximated. Erythema decreasing.     Labs:  Latest Chem--7 with Mg    No results found for this basename:  NA,K,CL,CO2,BUN,CR,GLU,MG in the last 48 hours  Latest CBC    No results found for this basename: WBC,HGB,HCT,PLT in the last 48 hours  Latest INR / APTT    No results found for this basename: INR,APTT in the last 48 hours  ABG Last 24 hours   No results found for this basename: ARTPO2:*,ARTO2SAT:*,ARTPCO2:*,ARTPH:*,ARTHCO3:*,ARTBE:* in the last 24 hours    Cultures: Final - no growth    Vanco trough 14.6 (11/25)    PLAN:   Neuro: superficial wound infection  - Pain control on oral medications  - Vanc q12hrs + Cefepime q6hrs Day 12/14   - Serial neuro exams    Pulmonary: Stable on RA  CV: Stable; changed VS check to Q6 hours, allowing for nighttime sleep.  ID: Cellulitis/superficial wound infection, on abx as above per ID. Vanco trough 14.6 (goal 10-15; 05/03/13). ID advised Levofloxacin 750 mg PO daily and Doxycycline 100 mg PO  BID for remaining days of 14-day antibiotic regime (plan to d/c PICC and switch to PO once dispo is clarified, if within 14-day period). Plan to continue IV antibiotics to assure adequate penetration and compliance.  F/E/N: NS TKO  GI: Regular diet, routine bowel care, last BM 11/24.  GU: Voids spont. No incontinence.  DVT: SCDs, SQH.  Lines: Rt PICC 11/15, PIVs  Dispo: [facility] Caseworker contacted by DCP; no housing options via that route. Patient reports housing situation is not established yet. Plan to continue IV abx through 14-day course.     Report electronically signed by:  Daleen Squibb, PhD, RN, NP-BC  Nurse Practitioner III  Department of Neurological Surgery  [phone], Page [phone]     I agree with the assessment and plan as outlined in the note.  Report electronically signed by Fernande Bras, MD. Attending

## 2013-05-03 NOTE — Nurse Assessment (Signed)
ASSESSMENT NOTE    Note Started: 05/03/2013, 19:47     Initial assessment completed and recorded in EMR.  Report received from day shift nurse and orders reviewed. Plan of Care reviewed and appropriate, discussed with patient.  Jimmy Footman, RN RN

## 2013-05-03 NOTE — Allied Health Progress (Signed)
MOZELL HARDACRE MRN: [id number]      Clinical Care Management Progress Note    Comments: Informed neurosurgery NP - Aurea Graff - that insurance is no longer covering inpatient stay.  Aurea Graff states team has discussed case and wants to keep patient in house until IV antibiotics (today is day 12/14) are completed as patient has a hx of non compliance and multiple admissions.  Informed NP that shelters closed over the holidays and weekend.    Further follow-up needed: Will continue to follow discharge needs.    Date: 05/03/2013  Time: 11:20    Electronically signed by:  Cleotilde Neer, RN, Clinical Case Manager  [phone]  [phone]

## 2013-05-04 MED ORDER — CEFEPIME 1 GRAM SOLUTION FOR INJECTION
1.0000 g | Freq: Four times a day (QID) | INTRAMUSCULAR | Status: DC
Start: 2013-05-04 — End: 2013-05-06
  Administered 2013-05-04 – 2013-05-05 (×6): 1 g via INTRAVENOUS
  Filled 2013-05-04 (×6): qty 1000

## 2013-05-04 MED ORDER — VANCOMYCIN 1.25 GRAM/250 ML IN DEXTROSE 5 % INTRAVENOUS SOLUTION
1250.0000 mg | Freq: Two times a day (BID) | INTRAVENOUS | Status: DC
Start: 2013-05-04 — End: 2013-05-06
  Administered 2013-05-04 – 2013-05-05 (×3): 1250 mg via INTRAVENOUS
  Filled 2013-05-04 (×4): qty 250

## 2013-05-04 NOTE — Nurse Assessment (Signed)
ASSESSMENT NOTE    Note Started: 05/04/2013, 08:15     Initial assessment completed and recorded in EMR.  Report received from night shift nurse and orders reviewed. Plan of Care reviewed and appropriate, discussed with patient.  Standley Dakins, RN RN

## 2013-05-04 NOTE — Nurse Assessment (Signed)
ASSESSMENT NOTE    Note Started: 05/04/2013, 21:44     Initial assessment completed @1933  and recorded in EMR.  Report received from day shift nurse and orders reviewed. Pt A&Ox4 and in stable condition with no signs of distress observed at this time. Bed locked, in low position, safety equipment at bedside, and call light within reach. Plan of care: pain control, monitor VS, I&Os, s/s of infx, neuro and sensorimotor checks, administer medications as ordered, PICC care, and assist w/ADLs as needed. Plan of Care reviewed and appropriate, discussed with patient. Will continue to monitor pt. Cyd Silence, RN RN

## 2013-05-04 NOTE — Progress Notes (Addendum)
DEPARTMENT OF NEUROLOGICAL SURGERY  [facility] PROGRESS NOTE          Date of service: 05/04/2013    POD: 29    S/P: T10-L2 Poster fusion and T12 Laminectomy     ID: Carl Mata is a 37yr old male, with who sustained a T12 burst fracture (hx fall 10/31/12 and 12/06/12) and underwent T12 laminectomy, T10-L2 posterior fusion by Dr. Selena Batten on 04/04/13. He was admitted on 04/17/13 for concern of possible wound infection but then left AMA on 04/18/13. He presented to the ED on 04/22/13 with a chief complaint of back pain that began months ago. Stated on 11/14 that he ran out of his MSContin 30mg  tabs that was prescribed with pain 8/0-10. Had one episode of urinary incontinence 04/21/13.     INTERVAL SUMMARY:  - Continue antibiotics   - Pain adequately controlled on PO regimen 6/10.   - Wound is well approximate    OBJECTIVE:     Medications  Acetaminophen (TYLENOL) Tablet 1,000 mg, ORAL, Q6H  Cefepime (MAXIPIME) IV 1 g, IV, Q6H Now, Last Rate: 1 g (05/04/13 0507)  Cyclobenzaprine (FLEXERIL) Tablet 10 mg, ORAL, Q8H  Docusate (COLACE) Capsule 100 mg, ORAL, BID  Heparin PF 5,000 units/0.5 mL Injection 5,000 Units, SUBCUTANEOUS, Q8H  Nicotine (NICODERM CQ) 21 mg/24 hr Patch 1 patch, Transdermal, Q24H Now  Nicotine patch REMOVAL 1 patch, Transdermal, Q24H Now  Sennosides (SENOKOT) Tablet 17.2 mg, ORAL, BID  Vancomycin (VANCOCIN) 1,250 mg in D5W 250 mL IVPB, IV, Q12H Now, Last Rate: 1,250 mg (05/04/13 0147)      IV  NaCl 0.9% 1,000 mL with Potassium Chloride 20 mEq, , IV, CONTINUOUS, Last Rate: 20 mL/hr at 05/04/13 0507      PRNs  Alteplase Catheter Clearance (CATHFLO ACTIVASE) 2 mg, Intercatheter, PRN  Bisacodyl (DULCOLAX) Suppository 10 mg, RECTALLY, Q24H PRN  Diazepam (VALIUM) Tablet 5 mg, ORAL, Q8H PRN  Heparin (PF) 100 units/mL Flush Syringe 3 mL, Intercatheter, PRN  Lidocaine (L-M-X4) 4 % Cream, TOPICAL-site required, PRN  Lidocaine (XYLOCAINE) 10 mg/mL (1%) Injection 0.1-2 mL, INTRADERMAL, PRN  Lidocaine cream REMOVAL,  TOPICAL-site required, PRN  Magnesium Hydroxide (MILK OF MAGNESIA) 400 mg/5 mL Suspension 30 mL, ORAL, Q12H PRN  Oxycodone (ROXICODONE) Tablet 10-20 mg, ORAL, Q4H PRN      Current Vital Signs  Temp: 36.6 C (97.8 F) (11/26 0511)  Temp src: Oral (11/26 0511)  Pulse: 77 (11/26 0511)  BP: 108/67 mmHg (11/26 0511)  Resp: 19 (11/26 0511)  SpO2: 98 % (11/26 0511)  Height: 172.7 cm (5\' 8" ) (11/14 2051)  Weight: 67.722 kg (149 lb 4.8 oz) (11/26 0334)    24 Hour Vital Signs    Temp src:  [-]   Temp:  [36.5 C (97.7 F)-36.8 C (98.3 F)]   Pulse:  [77-96]   BP: (100-110)/(65-76)   Resp:  [16-19]   SpO2:  [97 %-99 %]       I/O Last 2 Completed Shifts:  In: 2020 [Oral:1720; Crystalloid:300]  Out: -     PHYSICAL EXAM:  General exam: Resting comfortably in bed. Moves easily to reposition, get OOB.  Neuro Exam:   GCS: 15  Pupils: OU size: 3 mm, reaction: brisk  Cranial Nerves: II-XII intact   MAE 5/5 in all extremities  Sensation: grossly intact to LT/PP.  DTR low  Wound: incision well approximated. Erythema decreasing.     Labs:  Latest Chem--7 with Mg    No results found for this basename: NA,K,CL,CO2,BUN,CR,GLU,MG  in the last 48 hours  Latest CBC    No results found for this basename: WBC,HGB,HCT,PLT in the last 48 hours  Latest INR / APTT    No results found for this basename: INR,APTT in the last 48 hours  ABG Last 24 hours   No results found for this basename: ARTPO2:*,ARTO2SAT:*,ARTPCO2:*,ARTPH:*,ARTHCO3:*,ARTBE:* in the last 24 hours    Cultures: Final - no growth    Vanco trough 14.6 (11/25)    PLAN:   Neuro: superficial wound infection  - Pain control on oral medications  - Vanc q12hrs + Cefepime q6hrs Day 13/14   - Serial neuro exams    Pulmonary: Stable on RA  CV: Stable; changed VS check to Q6 hours, allowing for nighttime sleep.  ID: Cellulitis/superficial wound infection, on abx as above per ID. Vanco trough 14.6 (goal 10-15; 05/03/13). ID advised Levofloxacin 750 mg PO daily and Doxycycline 100 mg PO BID for  remaining days of 14-day antibiotic regime (plan to d/c PICC and switch to PO once dispo is clarified, if within 14-day period). Plan to continue IV antibiotics to assure adequate penetration and compliance.  F/E/N: NS TKO  GI: Regular diet, routine bowel care, last BM 11/24.  GU: Voids spont. No incontinence.  DVT: SCDs, SQH.  Lines: Rt PICC 11/15, PIVs  Dispo: [facility] Caseworker contacted by DCP; no housing options via that route. Patient reports housing situation is not established yet. Plan to continue IV abx through 14-day course.     Risa Grill  M.D.  Neurosurgery PGY1  Service Pager: [phone]  Personal Pager: [phone]  PI: [phone]    I agree with the assessment and plan as outlined in the resident's note.  Report electronically signed by Fernande Bras, MD. Attending

## 2013-05-05 NOTE — Nurse Assessment (Signed)
ASSESSMENT NOTE    Note Started: 05/05/2013, 12:07     Initial assessment completed and recorded in EMR.  Report received from night shift nurse and orders reviewed. Plan of Care reviewed and appropriate, discussed with patient.  Standley Dakins, RN RN

## 2013-05-05 NOTE — Progress Notes (Addendum)
DEPARTMENT OF NEUROLOGICAL SURGERY  WARD PROGRESS NOTE          Date of service: 05/05/2013    POD: 30    S/P: T10-L2 Poster fusion and T12 Laminectomy     ID: Carl Mata is a 37yr old male, with who sustained a T12 burst fracture (hx fall 10/31/12 and 12/06/12) and underwent T12 laminectomy, T10-L2 posterior fusion by Dr. Selena Batten on 04/04/13. He was admitted on 04/17/13 for concern of possible wound infection but then left AMA on 04/18/13. He presented to the ED on 04/22/13 with a chief complaint of back pain that began months ago. Stated on 11/14 that he ran out of his MSContin 30mg  tabs that was prescribed with pain 8/0-10. Had one episode of urinary incontinence 04/21/13.     INTERVAL SUMMARY:  - Last day of antibiotics today  - Pain well controlled, ambulating without assistance.   - Wound remains intact and healing well.  - ESR and CRP remain stable but elevated, Patient afebrile   - No leukocytosis.   - DC home tomorrow     OBJECTIVE:     Medications  Acetaminophen (TYLENOL) Tablet 1,000 mg, ORAL, Q6H  Cefepime (MAXIPIME) IV 1 g, IV, Q6H Now  Cyclobenzaprine (FLEXERIL) Tablet 10 mg, ORAL, Q8H  Docusate (COLACE) Capsule 100 mg, ORAL, BID  Heparin PF 5,000 units/0.5 mL Injection 5,000 Units, SUBCUTANEOUS, Q8H  Nicotine (NICODERM CQ) 21 mg/24 hr Patch 1 patch, Transdermal, Q24H Now  Nicotine patch REMOVAL 1 patch, Transdermal, Q24H Now  Sennosides (SENOKOT) Tablet 17.2 mg, ORAL, BID  Vancomycin (VANCOCIN) 1,250 mg in D5W 250 mL IVPB, IV, Q12H Now      IV  NaCl 0.9% 1,000 mL with Potassium Chloride 20 mEq, , IV, CONTINUOUS, Last Rate: 20 mL/hr at 05/05/13 0507      PRNs  Alteplase Catheter Clearance (CATHFLO ACTIVASE) 2 mg, Intercatheter, PRN  Bisacodyl (DULCOLAX) Suppository 10 mg, RECTALLY, Q24H PRN  Diazepam (VALIUM) Tablet 5 mg, ORAL, Q8H PRN  Heparin (PF) 100 units/mL Flush Syringe 3 mL, Intercatheter, PRN  Lidocaine (L-M-X4) 4 % Cream, TOPICAL-site required, PRN  Lidocaine (XYLOCAINE) 10 mg/mL (1%)  Injection 0.1-2 mL, INTRADERMAL, PRN  Lidocaine cream REMOVAL, TOPICAL-site required, PRN  Magnesium Hydroxide (MILK OF MAGNESIA) 400 mg/5 mL Suspension 30 mL, ORAL, Q12H PRN  Oxycodone (ROXICODONE) Tablet 10-20 mg, ORAL, Q4H PRN      Current Vital Signs  Temp: 36.6 C (97.9 F) (11/27 0900)  Temp src: Oral (11/27 0900)  Pulse: 80 (11/27 0900)  BP: 102/60 mmHg (11/27 0900)  Resp: 18 (11/27 0900)  SpO2: 97 % (11/27 0900)  Height: 172.7 cm (5\' 8" ) (11/14 2051)  Weight: 67.722 kg (149 lb 4.8 oz) (11/26 0334)    24 Hour Vital Signs    Temp src:  [-]   Temp:  [36.6 C (97.9 F)-36.7 C (98.1 F)]   Pulse:  [80-98]   BP: (102-115)/(60-74)   Resp:  [18]   SpO2:  [97 %-98 %]       I/O Last 2 Completed Shifts:  In: 1322 [Oral:1200; Crystalloid:122]  Out: -     PHYSICAL EXAM:  General exam: Resting comfortably in bed. Moves easily to reposition, get OOB.  Neuro Exam:   GCS: 15  Pupils: OU size: 3 mm, reaction: brisk  Cranial Nerves: II-XII intact   MAE 5/5 in all extremities  Sensation: grossly intact to LT/PP.  DTR low  Wound: incision well approximated. Erythema decreasing.     Labs:  Latest Chem--7 with Mg    No results found for this basename: NA,K,CL,CO2,BUN,CR,GLU,MG in the last 48 hours  Latest CBC  Recent labs for the past 48 hours     05/05/13 0750    WHITE BLOOD CELL COUNT 6.3    HEMOGLOBIN 11.2*    HEMATOCRIT 33.0*    PLATELET COUNT 245     Latest INR / APTT    No results found for this basename: INR,APTT in the last 48 hours  ABG Last 24 hours   No results found for this basename: ARTPO2:*,ARTO2SAT:*,ARTPCO2:*,ARTPH:*,ARTHCO3:*,ARTBE:* in the last 24 hours    Cultures: Final - no growth    Vanco trough 14.6 (11/25)    PLAN:   Neuro: superficial wound infection  - Pain control on oral medications  - Vanc q12hrs + Cefepime q6hrs Day 14/14   - Serial neuro exams    Pulmonary: Stable on RA  CV: Stable; changed VS check to Q6 hours, allowing for nighttime sleep.  ID: Cellulitis/superficial wound infection, on abx  as above per ID. Vanco trough 14.6 (goal 10-15; 05/03/13). ID advised Levofloxacin 750 mg PO daily and Doxycycline 100 mg PO BID for remaining days of 14-day antibiotic regime (plan to d/c PICC and switch to PO once dispo is clarified, if within 14-day period). Plan to continue IV antibiotics to assure adequate penetration and compliance.  F/E/N: NS TKO  GI: Regular diet, routine bowel care, last BM 11/24.  GU: Voids spont. No incontinence.  DVT: SCDs, SQH.  Lines: Rt PICC 11/15, PIVs  Dispo: [facility] Caseworker contacted by DCP; no housing options via that route. Patient reports housing situation is not established yet. Plan to continue IV abx through 14-day course.     Risa Grill  M.D.  Neurosurgery PGY1  Service Pager: [phone]  Personal Pager: [phone]  PI: [id number]    I agree with the assessment and plan as outlined in the resident's note.  Report electronically signed by Fernande Bras, MD. Attending

## 2013-05-05 NOTE — Nurse Focus (Signed)
@  0981, pt left unit and reports will be back in 20 - 25 mins. Education was done on unit policies regarding pts leaving the unit, risk of infx occurring with PICC line when off unit, and pt safety concerns. A Temporary Absence Release Consents/Refusals was signed by pt. Charge nurse and MD Sharee Pimple [phone]) aware of situation. Will notify day shift RN during change of shift.   Pt returned to unit @ 0655. Cyd Silence, RN

## 2013-05-05 NOTE — Discharge Summary (Addendum)
DEPARTMENT OF NEUROLOGICAL SURGERY  HOSPITAL DISCHARGE SUMMARY  Note Started: 05/05/2013, 09:00  Admission Date: 04/22/2013  7:50 AM  Discharge Date: 05/06/2013   Admission Diagnosis: Chronic back pain [724.5, 338.29]  Wound infection after surgery [998.59]  Discharge Diagnosis: same    PAST MEDICAL HISTORY:  Past Medical History   Diagnosis Date    Unspecified mental or behavioral problem      bipolar    Unspecified asthma(493.90)        OPERATIONS / PROCEDURES:  None    CONSULTATIONS:  ID      HISTORY OF PRESENT ILLNESS:  37yo male with T12 burst fracture s/p T12 laminectomy, T10-L2 posterior fusion by Dr. Selena Mata on 04/04/13 who presents to the ED with fevers, chills, night sweats, and severe back pain at the site of his surgery. He was doing well until approximately 2 days ago when he woke up in a pool of sweat. He has been having fevers and chills since then. He denies new weakness, paresthesias, urinary or bowel incontinence or other neurologic symptoms. He also denies neck stiffness or altered mental status, and was oriented but was very slow to answer questions in the ED and would fall asleep between questions.    A friend who has been taking care of him says that he is basically homeless and goes from one woman's house to another. Several days ago he took half a bottle of MS contin and was seen at [facility] for overdose. Both the patient and his friend deny recent meth use and deny any hx of IV drug use. The patient reportedly last smoked meth several months ago.    He has a hx trauma in May 2014(fall from [facility] unit 6 feet above ground onto buttocks) with T12 burst fracture, initially treated in a brace, but returned to care with pain, saddle anesthesia, urinary incontinence and MRI showing worsening kyphotic deformity which prompted surigcal intervention earlier this month.    EXAM:  General exam: Resting comfortably in bed. Moves easily to reposition, get OOB.  Neuro Exam:   GCS: 15  Pupils: OU size: 3 mm,  reaction: brisk  Cranial Nerves: II-XII intact   MAE 5/5 in all extremities  Sensation: grossly intact to LT/PP.  DTR low  Wound: incision well approximated. Erythema decreasing.       COURSE:   Patient was admitted to the hospital and received a picc line. He received 14 days of IV antibiotics with no complications. He was ambulating and tolerating a regular diet. He was given strict instructions for wound care and drug cessation. He was discharged home after removal of his Picc line. He was given Norco for pain control upon discharge.     INFECTIONS / COMPLICATIONS:  none    The patient's past medical, family, and social history was reviewed and confirmed.    DISPOSITION:  Patient is stable for discharge to: Home  Patient will follow up with Dr. Donita Mata in 4 weeks.     MEDICATIONS PRESCRIBED for DISCHARGE:  Current Discharge Medication List   Oxycodone 20 mg Q6 prn Pain 75 tabs NR  Valium 5 mg Q8 prn spams 75 tabs NR   CONTINUE these medications which have NOT CHANGED    Details   ALBUTEROL INHA       Cyclobenzaprine (FLEXERIL) 10 mg Tablet Take 1 tablet by mouth every 8 hours.  Qty: 90 tablet, Refills: 0      Diazepam (VALIUM) 5 mg Tablet Take 1 tablet by mouth  every 8 hours if needed for spasms.  Qty: 50 tablet, Refills: 0      !! Docusate (COLACE) 100 mg Capsule Take 1 capsule by mouth 2 times daily.  Qty: 60 capsule, Refills: 2      !! Docusate (COLACE) 100 mg Capsule Take 1 capsule by mouth 2 times daily for constipation.  Qty: 50 capsule, Refills: 0      Sennosides (SENOKOT) 8.6 mg Tablet Take 2 tablets by mouth every day at bedtime if needed.  Qty: 50 tablet, Refills: 0       !! - Potential duplicate medications found. Please discuss with provider.      STOP taking these medications       Morphine (MS CONTIN) 30 mg SR 12hr Tablet Comments:   Reason for Stopping:               A total of 60 minutes was spent coordinating patient discharge.     Carl Pinto, MD   Intern  Pager#: [phone]                  I  agree with the assessment and D/C plan as outlined in the resident's note.  Report electronically signed by Carl Bras, MD. Attending

## 2013-05-06 ENCOUNTER — Other Ambulatory Visit: Payer: Self-pay

## 2013-05-06 MED ORDER — OXYCODONE 20 MG TABLET
1.0000 | ORAL_TABLET | Freq: Four times a day (QID) | ORAL | 0 refills | Status: DC | PRN
Start: 2013-05-06 — End: 2014-11-30
  Filled 2013-05-06: qty 75, 19d supply, fill #0

## 2013-05-06 MED ORDER — DIAZEPAM 5 MG TABLET
1.0000 | ORAL_TABLET | Freq: Three times a day (TID) | ORAL | 0 refills | Status: DC | PRN
Start: 2013-05-06 — End: 2014-11-30
  Filled 2013-05-06: qty 75, 25d supply, fill #0

## 2013-05-06 NOTE — Nurse Focus (Signed)
DRAIN CLEANUP NOTE    Note Started: 05/06/2013, 09:20     To maintain record integrity and accuracy, 1 active drain(s) were removed from the EMR database. Munachimso Palin Eldridge Nayelis Bonito    WOUND/DEVICE CLEANUP NOTE    Note Started: 05/06/2013, 09:20     To maintain record integrity and accuracy, 4 active wound(s)/device(s) were removed from the EMR database. Tea Collums [employer]

## 2013-05-06 NOTE — Nurse Assessment (Signed)
ASSESSMENT NOTE    Note Started: 05/06/2013, 01:05     Initial assessment completed @1932  and recorded in EMR.  Report received from day shift nurse and orders reviewed. Pt A&Ox4 and in stable condition with no signs of distress observed at this time. Bed locked, in low position, safety equipment at bedside,and call light within reach. Plan of care: pain control, monitor VS, I&Os, s/s of infx, promote sleep from 2300-0500, administer medications as ordered, neurological and sensorimotor checks, PICC care, and assist w/ADLs as needed. Plan of Care reviewed and appropriate, discussed with patient.  Will continue to monitor pt. Cyd Silence, RN RN

## 2013-05-06 NOTE — Nurse Procedure (Signed)
PICC line pulled per order. PICC tip appears to have been cut at 44cm. Per documentation cut at 45 cm. Cut straight across with no signs of ripping. No resistance met when pulled. Pt has no c/o. Assume documentation mistake at insertion. Will monitor pt.    Berneta Levins, RN

## 2013-05-06 NOTE — Nurse Discharge Note (Addendum)
Discharge instructions reviewed with patient, pt verbalizes understanding. Pt stable. Pt has no shoes and asks if we have some we can give him. [facility] checking donated clothing room. Will proceed with discharge when [facility] returns.    Berneta Levins, RN    Pt discharged at [phone].

## 2013-05-06 NOTE — Nurse Assessment (Addendum)
ASSESSMENT NOTE    Note Started: 05/06/2013, 07:30     Initial assessment completed and recorded in EMR.  Report received from night shift nurse and orders reviewed. Pt A&Ox3, csm+, pt states he is ready for discharge today, asking for jacket and shoes. Plan of Care reviewed and appropriate, discussed with patient.  Tamera Punt ,RN

## 2013-05-13 ENCOUNTER — Emergency Department
Admission: EM | Admit: 2013-05-13 | Discharge: 2013-05-14 | Disposition: A | Discharge status: Home / Self Care | Payer: MEDICAID | Attending: Emergency Medicine | Admitting: Emergency Medicine

## 2013-05-13 DIAGNOSIS — Z981 Arthrodesis status: Secondary | ICD-10-CM | POA: Insufficient documentation

## 2013-05-13 DIAGNOSIS — G8929 Other chronic pain: Secondary | ICD-10-CM

## 2013-05-13 DIAGNOSIS — Z8781 Personal history of (healed) traumatic fracture: Secondary | ICD-10-CM | POA: Insufficient documentation

## 2013-05-13 DIAGNOSIS — M549 Dorsalgia, unspecified: Secondary | ICD-10-CM | POA: Insufficient documentation

## 2013-05-13 DIAGNOSIS — M546 Pain in thoracic spine: Secondary | ICD-10-CM | POA: Insufficient documentation

## 2013-05-13 DIAGNOSIS — F39 Unspecified mood [affective] disorder: Secondary | ICD-10-CM | POA: Insufficient documentation

## 2013-05-13 NOTE — ED Nursing Note (Signed)
No significant change. Neuro intact.

## 2013-05-13 NOTE — ED Triage Note (Signed)
Brought in from home, reports of back pain after lifting linens and pillows, had back surgery 4 weeks ago, has numbness and tingling by the waist, reports of incontinence twice since surgery was done, his MDs are aware. Reports of increased pain on his back, has ran out of pain medications a week ago. Ambulatory to waiting room.

## 2013-05-13 NOTE — ED Nursing Note (Addendum)
Pt. SI for OD for pain meds r/t back pain. Pt. Wanting to go to a Psyche facility. Pt. Fidgety in chair . Easily agitated. Denies HI.h/o bipolar with previous psyche admit. Not on any psyche meds.  PES contacted. Pt. Reports homeless and jobless . Pt. To [facility] for psyche eval.

## 2013-05-14 ENCOUNTER — Emergency Department (EMERGENCY_DEPARTMENT_HOSPITAL): Payer: MEDICAID

## 2013-05-14 ENCOUNTER — Encounter: Payer: Self-pay | Admitting: Emergency Medicine

## 2013-05-14 DIAGNOSIS — F39 Unspecified mood [affective] disorder: Secondary | ICD-10-CM | POA: Insufficient documentation

## 2013-05-14 MED ORDER — NACL 0.9% IV BOLUS - DURATION REQ
1000.0000 mL | Freq: Once | INTRAVENOUS | Status: AC
Start: 2013-05-14 — End: 2013-05-14
  Administered 2013-05-14: 1000 mL via INTRAVENOUS

## 2013-05-14 MED ORDER — GADODIAMIDE 5 MMOL/10 ML (287 MG/ML) INTRAVENOUS SYRINGE
10.0000 mL | INJECTION | INTRAVENOUS | Status: AC
Start: 2013-05-14 — End: 2013-05-14
  Administered 2013-05-14: 10 mL via INTRAVENOUS

## 2013-05-14 MED ORDER — ONDANSETRON HCL (PF) 4 MG/2 ML INJECTION SOLUTION
4.0000 mg | Freq: Once | INTRAMUSCULAR | Status: AC
Start: 2013-05-14 — End: 2013-05-14
  Administered 2013-05-14: 4 mg via INTRAVENOUS
  Filled 2013-05-14: qty 2

## 2013-05-14 MED ORDER — HYDROCODONE 5 MG-ACETAMINOPHEN 325 MG TABLET
1.0000 | ORAL_TABLET | Freq: Once | ORAL | Status: AC
Start: 2013-05-14 — End: 2013-05-14
  Administered 2013-05-14: 2 via ORAL
  Filled 2013-05-14: qty 2

## 2013-05-14 MED ORDER — HYDROCODONE 5 MG-ACETAMINOPHEN 325 MG TABLET
1.0000 | ORAL_TABLET | Freq: Once | ORAL | Status: DC
Start: 2013-05-14 — End: 2013-05-14
  Filled 2013-05-14: qty 2

## 2013-05-14 MED ORDER — MORPHINE 2 MG/ML INJECTION SYRINGE
2.0000 mg | INJECTION | INTRAMUSCULAR | Status: DC | PRN
Start: 2013-05-14 — End: 2013-05-14
  Administered 2013-05-14: 4 mg via INTRAVENOUS
  Administered 2013-05-14: 8 mg via INTRAVENOUS
  Administered 2013-05-14 (×2): 4 mg via INTRAVENOUS
  Administered 2013-05-14 (×3): 8 mg via INTRAVENOUS
  Administered 2013-05-14: 6 mg via INTRAVENOUS
  Filled 2013-05-14: qty 1
  Filled 2013-05-14 (×2): qty 2
  Filled 2013-05-14 (×2): qty 1
  Filled 2013-05-14 (×3): qty 2

## 2013-05-14 NOTE — Allied Health Progress (Signed)
CLINICAL SOCIAL SERVICES   CRISIS SERVICES PROGRESS NOTE  Note Date and Time: 05/14/2013    00:10  Date of Admission: 05/13/2013 10:54 PM    Patient Name: Carl Mata Referral Source: Medical team   DOB: [birth year]  Age: 20yr     PES interviewed pt at 2336. Pt does not meet criteria for a 5150 hold. Complete psych write-up to follow.      Signature: Juleen China, LCSW       Pager # [phone]  Licensed Clinical Social Worker

## 2013-05-14 NOTE — ED Nursing Note (Signed)
Attempted to call [employer] officer x 3  regarding left lower leg monitoring device due to MRI exam

## 2013-05-14 NOTE — ED Nursing Note (Signed)
[employer] coordinating to remove ankle bracelet monitoring device with parol officer.

## 2013-05-14 NOTE — ED Nursing Note (Signed)
Awaiting MRI. [employer] agreeable to remove ankle monitoring device for MRI.Neuro surgery at bedside for assessment and plan of care. Per MD awaiting MRI. Will monitor. Medicated for pain as needed with relief

## 2013-05-14 NOTE — Consults (Addendum)
DEPARTMENT OF NEUROLOGICAL SURGERY  GENERAL CONSULTATION    Consulting Service:  ED    Date of Admission:   05/13/2013 10:54 PM Date of Service: 05/14/13   Note Started: 05/14/2013, 08:25 Name of Requesting Attending: Laurin         REASON FOR CONSULTATION:  Back pain    HISTORY OF PRESENT ILLNESS:  37yoM, well known to our service, who is s/p T12 burst fracture s/p T10-L2 fusion with Dr. Selena Batten on 10/27; with persistent h/o pain and intermittent incontinence.  Has presented multiple times requesting narcotics and is currently out of Norco.    HISTORY:  Patient Active Problem List    Diagnosis Date Noted    Chronic back pain 04/22/2013    Wound infection after surgery 04/22/2013    Stable burst fx of  T12 fracture 04/09/2013     Overview Note:     Patient sustained a stable T12 burst fracture and was seen and examined by the neurosurgery spine service who do not recommend surgical intervention for this type of fracture. It was recommended that he be fitted for a TLSO back brace which is to be worn while sitting upright and standing at all times for 4-6 weeks. His pain is controlled with PO narcotics and flexeril. He is to follow up with the neurosurgery spine clinic in 4 weeks or sooner if he is experiencing worsening of symptoms.       Back pain 12/23/2012    Fall 12/07/2012     Overview Note:     C-spine cleared.       Elbow contusion 11/28/2012    Contusion 10/31/2012    Back sprain 10/31/2012    Cough 10/06/2012    Abdominal pain 09/24/2012    Right medial knee pain 09/20/2012    Allergies   Allergen Reactions    Nsaids (Non-Steroidal Anti-Inflammatory Drug) Anaphylaxis      Past Medical History   Diagnosis Date    Unspecified mental or behavioral problem      bipolar    Unspecified asthma(493.90)      Past Surgical History   Procedure Laterality Date    Fusion thoracic posterior N/A 04/04/2013     Procedure: FUSION THORACIC POSTERIOR;  Surgeon: Fernande Bras, MD;  [facility];  Service:  Neurosurgery    Laminectomy thoracic posterior N/A 04/04/2013     Procedure: LAMINECTOMY THORACIC POSTERIOR;  Surgeon: Fernande Bras, MD;  [facility];  Service: Neurosurgery      (Not in a hospital admission)   Social History     Occupational History    Not on file.     Social History Main Topics    Smoking status: Current Every Day Smoker -- 0.50 packs/day for 15 years     Types: Cigarettes    Smokeless tobacco: Not on file    Alcohol Use: No    Drug Use: Yes     Special: Methamphetamine      Comment: states he used meth >1 yr ago    Sexual Activity: Not on file    Family History   Problem Relation Age of Onset    No FH spine problems [Other] [OTHER]          There is no immunization history on file for this patient.    The patient's past medical, family, and social history was reviewed and confirmed.    ROS:  All other systems negative except as noted in the HPI.    VITAL  SIGNS:  Vital Signs (Last Recorded):  Temp: 37.1 C (98.8 F) (05/14/13 0639)  Temp src: Oral  Pulse: 74  BP: 126/75 mmHg  Resp: 20  SpO2: 100 %        There is no height or weight on file to calculate BSA.  There is no weight on file to calculate BMI.    PHYSICAL EXAM:    Neuro Exam: aaox3  GCS: Eyes: 4 Verbal: 5 Motor: 6  Motor:    Hip Flexion Knee Extension Knee Flexion Dorsiflexion Great Toe Extension Plantar Flexion   Right 4+ 4+ 4+ 5 5 5    Left 4+ 4+ 4+ 4+ 4+ 4+     Sensation reported as diminished but intact to LT throughout BLE  -- no saddle anesthesia  -- peri-anal sensation intact    -incision intact; mild peri-incisional erythema    IMAGING: pending    Latest CMP  Recent labs for the past 48 hours     05/14/13 0508    SODIUM 139    POTASSIUM 4.0    CHLORIDE 108    CARBON DIOXIDE TOTAL 22*    UREA NITROGEN, BLOOD (BUN) 19    CREATININE BLOOD 0.94    E-GFR, AFRICAN AMERICAN &gt;60    E-GFR, NON-AFRICAN AMERICAN &gt;60    GLUCOSE 79    CALCIUM 9.6    PROTHROMBIN TIME --    ALBUMIN 3.9    ALKALINE PHOSPHATASE  (ALP) 92    ALANINE TRANSFERASE (ALT) 22     Latest CBC  Recent labs for the past 48 hours     05/14/13 0508    WHITE BLOOD CELL COUNT 9.0    HEMOGLOBIN 12.8*    HEMATOCRIT 37.2*    PLATELET COUNT 307     Latest INR / APTT    No results found for this basename: INR,APTT in the last 48 hours    ASSESSMENT: 37yoM with PmHx significant for T12 burst fracture s/p T10-L2 posterior fusion.   -- c/o worsening pain, radiating up and down back  -- no clear change in neuro exam from prior admissions/consulations  -- concern for drug seeking has already been discussed    RECOMMENDATIONS:   -- please obtain MRI L spine to ensure instrumentation intact and that there's no new anatomic explanation for his back pain symptoms  - case discussed with chief, who agrees with assessment and plan; if there is an acute change on imaging, please call us back for further recommendations.    Almyra Brace. Claybon Jabs, MD, MPH  Neurological Surgery, R5  Licensed Resident  Personal pager: [phone]  Service pager: [phone]  PI# [id number]      Neurosurgery Attending Addendum  Recent clinical events reviewed.  Recent laboratory and radiographic studies reviewed.  Agree with above treatment plan.        Blenda Bridegroom, MD, PhD  Department of Neurological Surgery  PI [phone]

## 2013-05-14 NOTE — ED Nursing Note (Signed)
Patient vomited after eating sandwich, drinking juice.

## 2013-05-14 NOTE — Consults (Signed)
NSG Resident Sign Off Note  05/14/2013     MRI negative for hardware complication, cord compression and other surgical issues. Hardware in adequate position and grossly normoalignment of thoracic lumbar spine stable from prior studies. Wound non-erythematous and no s/s of leakage or infection. WBC is wnl.     Pain control issue management PRN per ED. Follow up in clinic as scheduled with spine staff.    NSG to sign off.     Lissa Hoard, M.D. PGY2  [place]-Neurosurgery  NSG Svc Pager: [phone]  Direct Pager [phone]  PI# [phone]

## 2013-05-14 NOTE — Discharge Instructions (Signed)
 You were evaluated and treated today for back pain. Your workup today, including MRI does not show any new changes or concerning findings. Continue your current medications and follow up with your Primary Doctor in 2 days. Return to ER for any significantly worsening or concerning symptoms.

## 2013-05-14 NOTE — ED Nursing Note (Signed)
Pt given home care instructions at this time. Pt stable at time of discharge left with steady gait.

## 2013-05-14 NOTE — ED Nursing Note (Signed)
Received patient from the hallway who came to ED due to increasing lower back pain and reported episode of incontinence. Pt reported back surgery 4 weeks ago. Reported having episode if numbness and tingling sensation to extremities. NOted patient was mabulating with steady gait at the hallway with no noted pain. AOx4. GCS 15. NAD. VSS. Breathing unlabored. No SOB. Luung CTA. Placed on monitoring. PIV placed. Will monitor

## 2013-05-14 NOTE — ED Nursing Note (Signed)
Patient attempting POs.  Continues to c/o severe pain to back.

## 2013-05-14 NOTE — ED Initial Note (Signed)
2  EMERGENCY DEPARTMENT PHYSICIAN NOTE - Carl Mata       Date of Service:   05/13/2013 10:54 PM Patient's PCP: No Pcp No Pcp   Note Started: 05/14/2013 04:28 DOB: [birth year]             Chief Complaint   Patient presents with    Back Pain Acute           The history provided by the patient.  Interpreter used: No    Carl Mata is a 37yr old male, with a past medical history significant for T-12 burst fracture s/p T12 laminectomy, T-10-L2 posterior fusion by Dr. Selena Batten on 10/27, who presents to the ED with a chief complaint of back pain that began yesterday at 5pm. .  Carrying a pillow, went to set it down and had instant pain, twist and heard pop from lower back.  Also noted abdominal pain and diarrhea a few hours ago, watery.     Quality: searing, piercing, numbing pain "feels like my back's on fire, then numb"  Location: Mid l spine and radiating down both legs anterior and posterior  Severity: 8/10  Time Course: constant  Progression: gradually worsening  Duration: hours  Palliative factors: nothing makes it better.  Provocative factors: nothing makes it worse.  Associated symptoms: Urinary incontinence, saddle anesthesia, numbness and tingling down legs  Pertinent negatives: No bowel incontinence, fevers, IV drug use    Abdominal pain: numbing weighty, piercing, throbbing, small of back to front of abdomen, + smoking 1 pp week.    A full history, including pertinent past medical, family and social history was reviewed.    HISTORY:  1    *Unspecified episodic mood disorder      Chronic back pain     Allergies   Allergen Reactions    Nsaids (Non-Steroidal Anti-Inflammatory Drug) Anaphylaxis      Past Medical History:    Unspecified mental or behavioral problem                      Unspecified asthma(493.90)                                 Past Surgical History:    Fusion thoracic posterior                       N/A 04/04/2013    Laminectomy thoracic posterior                  N/A 04/04/2013   Social History     Marital Status: SINGLE              Spouse Name:                       Years of Education:                 Number of children:               Occupational History    None on file    Social History Main Topics    Smoking Status: Current Every Day Smoker        Packs/Day: 0.50  Years: 15        Types: Cigarettes    Smokeless Status: Not on file  Alcohol Use: No              Drug Use: Yes                Special: Methamphetamine       Comment: states he used meth >1 yr ago    Sexual Activity: Not on file          Other Topics            Concern    None on file    Social History Narrative    As of 04/07/2013:     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 Reportedly on parole in [place]. Used to work as a Designer, fashion/clothing.    Lives in [place], currently "on the street," has a girlfriend but does not live with her. Doesn't know where he will live next. Says his GF's worker is looking into this.    H/o alcohol dependence (reports being sober for 1 year), + tobacco, + drug use (meth; reports last use >1 year ago).    ---------------------------     Review of patient's family history indicates:    No FH spine problems [Other] *                                    Review of Systems   Constitutional: Negative for fever and chills.   Respiratory: Positive for shortness of breath.    Cardiovascular: Negative for chest pain.   Gastrointestinal: Positive for abdominal pain.   Genitourinary: Positive for dysuria. Negative for discharge, scrotal swelling and testicular pain.   All other systems reviewed and are negative.        TRIAGE VITAL SIGNS:  Temp: 36.7 C (98.1 F) (05/13/13 2013)  Temp src: Oral (05/13/13 2013)  Pulse: 77 (05/13/13 2013)  BP: 104/55 mmHg (05/13/13 2013)  Resp: 18 (05/13/13 2013)  SpO2: 99 % (05/13/13 2013)  Weight: (not recorded)    Physical Exam   Nursing note and vitals reviewed.  Constitutional: He is oriented to person, place, and time. He appears  well-developed and well-nourished.   Appears uncomfortable   HENT:   Head: Normocephalic and atraumatic.   Right Ear: External ear normal.   Left Ear: External ear normal.   Mouth/Throat: Oropharynx is clear and moist. No oropharyngeal exudate.   Eyes: Conjunctivae are normal. Pupils are equal, round, and reactive to light. Right eye exhibits no discharge. Left eye exhibits no discharge. No scleral icterus.   Neck: Normal range of motion. Neck supple. No tracheal deviation present.   Cardiovascular: Normal rate, regular rhythm, normal heart sounds and intact distal pulses.  Exam reveals no gallop and no friction rub.    No murmur heard.  Pulmonary/Chest: Effort normal and breath sounds normal. No stridor. No respiratory distress. He has no wheezes. He has no rales. He exhibits no tenderness.   Abdominal: Soft. He exhibits no distension and no mass. There is tenderness. There is no rebound and no guarding.   Musculoskeletal: He exhibits no edema and no tenderness.   No saddle anesthesia, normal rectal tone, 4/5 strength to bilateral lower extremities, limited by pain   Neurological: He is alert and oriented to person, place, and time. No cranial nerve deficit. He exhibits normal muscle tone. Coordination normal.   Decreased sensation at left lateral knee, otherwise normal and symmetric   Skin: Skin  is warm and dry. No rash noted. No erythema. No pallor.           INITIAL ASSESSMENT & PLAN, MEDICAL DECISION MAKING, ED COURSE:  Carl Mata is a 37yr old male who presents with a chief complaint of acute back pain. After history and exam, I feel the diagnosis is most likely flare of chronic back pain. Differential diagnosis includes, but is not limited to cauda equina, musculoskeletal pain, fracture, surgical hardware infection, epidural abscess.  The patient is hemodynamically stable and will require pain control as an immediate intervention. Initial treatment and studies to evaluate this problem will include serial  exams, labs, imaging, analgesia as needed and antiemetics as needed.         ED Course Update: The patient was observed in the ED. The results of the ED evaluation were notable for the following:    Pertinent lab results (reviewed and interpreted independently by me):   Labs Reviewed   CBC AUTO + REFLEX MANUAL DIFF - Abnormal; Notable for the following:     RED CELL COUNT 4.08 (*)     HEMOGLOBIN 12.8 (*)     HEMATOCRIT 37.2 (*)     All other components within normal limits   BASIC METABOLIC PANEL - Abnormal; Notable for the following:     CARBON DIOXIDE TOTAL 22 (*)     All other components within normal limits   URINALYSIS-COMPLETE   HEPATIC FUNCTION PANEL   LIPASE       Pertinent imaging results (reviewed and interpreted independently by me):   MR T-SPINE WITHOUT CONTRAST - pending at time of signout  MR L-SPINE WITHOUT CONTRAST - pending at time of signout    Pertinent medications: morphine, zofran, NS, given for pain control.  Chart Review: I reviewed the patient's prior medical records. Pertinent information that is relevant to this encounter: hx of incontinence w/o canal compromise.    Patient Summary: Carl Mata is a 37yr old male who had recent surgery on back for burst fracture and fusion with subsequent admission for complications presenting with acutely worsening back pain starting last night.  Patient describes multiple findings that were present on previous admission that he is endorsing as new.  Concern for malingering, however, given the sever nature of complaints and moderately concerning exam, MRI T and L spine ordered which is pending at time of signout.  Will evaluation for cauda equina, surgical site complication/infection, unlikely abscess given no fever or chills.  If MRI negative, anticipate patient will be discharged home.  If positive, team will discuss with neurosurgery.        LAST VITAL SIGNS:  Temp: 36.7 C (98.1 F) (05/14/13 1009)  Temp src: Oral (05/14/13 1009)  Pulse: 67  (05/14/13 1240)  BP: 122/73 mmHg (05/14/13 1240)  Resp: 16 (05/14/13 1240)  SpO2: 99 % (05/14/13 1240)  Weight: (not recorded)      Clinical Impression:   Back pain      Anticipated work up needed: MRI      Disposition: Pending MRI    Report electronically signed by:  Mliss Fritz, MD Resident    Report electronically signed by: Melene Plan. Mickie Kay, MD  Attending Physician    This patient was seen, evaluated, and care plan was developed with the resident.  I agree with the findings and plan as outlined in our combined note.      Romona Curls, MD

## 2013-05-14 NOTE — ED Progress Note (Signed)
I assumed care from Dr. Mickie Kay who saw the patient for back pain. For details of H&P, original assessment and plan see original note for this visit.    Briefly the MRI and NSG final recs were pending.  MRI showed   "IMPRESSION:    Significantly limited examination, with lack of postcontrast thoracic spine  imaging due to patient's inability to tolerate the examination, and motion  on nearly all the remaining sequences of the thoracic and lumbar spine  despite attempts to repeat them.    Thoracic spine:  Stable compared with prior examination; posterior spinal fusion from T10-L2  with evaluation of central spinal canal limited due to artifact from  hardware; stable compression fracture at T12 contributing to mild central  spinal canal narrowing at this level.    Lumbar spine:  Very limited due to motion. Grossly stable compared to previous  examination, with stable moderate disc bulge at L2-L3 and grossly patent  central spinal canal and neural foramen. Persistent but improved mild edema  in the posterior paraspinal musculature around the hardware.    Final Report Electronically Signed By: Kathlen Mody, M.D. on 05/14/2013  2:04 PM "    In our discussion with NSG resident and attending, the plan will be to dc home and MRI is not suggestive of acute process and in unchanged from prior.  We also d/w police about replacing ankle bracelet and can't do on weekend so pt to go home without it and will get it replaced on Monday.     Stable for dc home.     Discharge information:  Patient is advised they should follow up with primary care physician in 1-2 days.  If unable to obtain follow up they may always return to ED .  They are also advised to return to ED immediately with any worsening symptoms or signs.  Patient understands discharge instructions and comfortable with discharge home. Patient was also given written discharge instructions and acknowledges receipt of discharge instructions.   Patient is also advised that  imaging studies were preliminary studies that does not exclude occult findings.  Patient is also advised that they may be called back to ED with any changes in final radiological readings      Electronically signed by: Mabeline Caras. Galvin Proffer, MD Attending Physician  Assistant Professor  Department of Emergency Medicine

## 2013-05-14 NOTE — ED Nursing Note (Signed)
Ankle bracelet removed by [employer].  Patient to MRI.

## 2013-05-14 NOTE — ED Nursing Note (Signed)
Assumed care of patient.  Reports increasing pain to right upper extremity.  Describes as throbbing, pounding pain.  Reports tingling to right lower extremity.  +pulses.  Awaiting MRI.

## 2013-05-14 NOTE — Allied Health Progress (Signed)
CLINICAL SOCIAL SERVICES   CRISIS SERVICES PSYCHIATRIC DIAGNOSTIC INTERVIEW  Note Date & Time: 05/14/2013    00:14  Date of Admission: 05/13/2013 10:54 PM    Date of Service: 12/5//2014 Referred By: medical team   Patient Name: Darryl Lent Ethnicity: The patient's reported ethnicity is , and he identifies as Not Hispanic or Latino.   DOB: 09-12-1975  Age: 37yr      Referral Source:Emergency Department: Attending ER Physician  Reason for Referral: Psychiatric Evaluation   Language:  English  Interpreter Assisting with Interview: No  Persons Interviewed: Pt    HISTORY OF PRESENTING ILLNESS  Pt BIB self, c/o of chronic back pain and is out of pain meds. Pt states he had back surgery about 4 weeks ago. Pt reports he has a pain pill and meth addiction. Pt wants help and want to be sent to a psych hospital, [facility] is where pt states he was last month. Pt reports he overdosed on pain medication in November and was taken to [facility]. Pt states he was placed on a 5150 hold and sent to a psych hospital, "[facility]," possibly he meant [facility]. Pt stated that after a day at the psych hospital he "talked" his way out and now he wants to go back. Pt denies SI, stating occasional thoughts of SI when pain is unbearable. Pt denies HI. Pt reports interest in addressing his pain pill and meth addictions and states the only way he will go is if he is "taken there directly from the hospital." Pt states when he gets discharged he will go find pain pills and meth to get high, not to hurt or kill himself.     PAST PSYCHIATRIC HISTORY  Pt reports psych hx when he was in jail, 14 years, "the whole time I was there," released 08/15/12. Pt could not recall psych med hx.    Pt reported he was at [facility]," possibly he meant [facility], for 1 day.    Per [facility], pt was at [facility], 05/11/13 - 05/12/13. 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     Pt denies any other psych hx or being prescribed psych meds at this  time.    PAST MEDICAL HISTORY  See MD chart. Pt reports recent back surgery, 4 weeks ago.    HISTORY OF SUBSTANCE ABUSE  Pt repots an addiction to pain pills and meth. Pt states last time he has access to pain pills was 1 week ago and last meth use was "sometime in November."    Per [facility], pt dx Amphetamine and Cocaine Dependence.    PSYCHOSOCIAL HISTORY  Marital Status: Single  Living Situation: homeless  Any safety issues at home or in a relationship: NO  Patient's Contact Info:   [address]  [place] [address]  Phone: [phone] (home)    Family involved: no, pt states family is in the [facility] and "spread all over."   Family support: no    MENTAL STATUS EXAM  Alert: yes  Oriented: Person, Place and Date  Appearance: appropriate  Behaviour: calm  Attitude: cooperative  Eye Contact: good  Affect: congruent with content of conversation  Mood: agitated, irritable and concerned  Speech: appropriate  Insight: good  Judgment: good  Impulse Control: good  Thought Process: intact  Thought Content: appropriate  Current Suicidal Ideation: no Plan: n/a   Current Homicidal Ideation: no  Plan: n/a     ASSESSMENT  Pt is a 37 year old male, in the  ED c/o of chronic back pain. Pt reports he his homeless and hungry. Pt requesting to be sent to "[facility]," meaning [facility]. Pt was just released from [facility] 1 day ago, 05/12/13. Pt stating reports interest in addressing his pain pill and meth addiction. Pt denies SI, stating if discharged he'll go get high and not get the help he needs. Pt denies HI. Pt does not meet criteria for a 5150 hold.    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 Pt more open to other community resources after that. Writer offered [facility] and patient reported interest. After pt is medically cleared and discharged, [facility] will be contacted for patient to interview for respite care. Pt was in agreement with plan.    Signature: Juleen China, LCSW      Pager #[phone]  Licensed Clinical Social Worker

## 2013-05-16 DIAGNOSIS — R159 Full incontinence of feces: Secondary | ICD-10-CM | POA: Insufficient documentation

## 2013-05-16 DIAGNOSIS — R109 Unspecified abdominal pain: Secondary | ICD-10-CM | POA: Insufficient documentation

## 2013-05-16 DIAGNOSIS — R32 Unspecified urinary incontinence: Secondary | ICD-10-CM | POA: Insufficient documentation

## 2013-05-16 DIAGNOSIS — M51379 Other intervertebral disc degeneration, lumbosacral region without mention of lumbar back pain or lower extremity pain: Secondary | ICD-10-CM | POA: Insufficient documentation

## 2013-05-16 DIAGNOSIS — Z981 Arthrodesis status: Secondary | ICD-10-CM | POA: Insufficient documentation

## 2013-05-16 DIAGNOSIS — R079 Chest pain, unspecified: Secondary | ICD-10-CM

## 2013-05-16 NOTE — ED Nursing Note (Signed)
Pt states R side chest/flank pain, increases with deep inspirations, also states he is having urinary and fecal incontinence. In NAD at this time, asking about wait time.

## 2013-05-16 NOTE — ED Triage Note (Signed)
Pt c/o back pain @ R flank pain. Reports incontinent of bowel and bladder. Reports decreased sensation to genital area. Pt "lost" surgeon's number. Has not done any follow up with MD; uses ED for f/u. Ambulatory.

## 2013-05-16 NOTE — ED Nursing Note (Signed)
Pt called for re-eval. Gait steady, no changes.

## 2013-05-17 ENCOUNTER — Emergency Department (EMERGENCY_DEPARTMENT_HOSPITAL): Payer: MEDICAID

## 2013-05-17 ENCOUNTER — Emergency Department
Admission: EM | Admit: 2013-05-17 | Discharge: 2013-05-17 | Disposition: A | Discharge status: Home / Self Care | Payer: MEDICAID | Attending: Emergency Medical Services | Admitting: Emergency Medical Services

## 2013-05-17 DIAGNOSIS — G8929 Other chronic pain: Secondary | ICD-10-CM

## 2013-05-17 MED ORDER — LORAZEPAM 2 MG/ML INJECTION SOLUTION
2.0000 mg | Freq: Once | INTRAMUSCULAR | Status: AC
Start: 2013-05-17 — End: 2013-05-17
  Administered 2013-05-17: 2 mg via INTRAVENOUS
  Filled 2013-05-17: qty 1

## 2013-05-17 MED ORDER — MORPHINE 2 MG/ML INJECTION SYRINGE
4.0000 mg | INJECTION | INTRAMUSCULAR | Status: DC | PRN
Start: 2013-05-17 — End: 2013-05-17
  Administered 2013-05-17 (×6): 8 mg via INTRAVENOUS
  Filled 2013-05-17 (×6): qty 2

## 2013-05-17 MED ORDER — NACL 0.9% IV BOLUS - DURATION REQ
1000.0000 mL | Freq: Once | INTRAVENOUS | Status: AC
Start: 2013-05-17 — End: 2013-05-17
  Administered 2013-05-17: 1000 mL via INTRAVENOUS

## 2013-05-17 MED ORDER — ONDANSETRON HCL (PF) 4 MG/2 ML INJECTION SOLUTION
4.0000 mg | Freq: Once | INTRAMUSCULAR | Status: AC
Start: 2013-05-17 — End: 2013-05-17
  Administered 2013-05-17: 4 mg via INTRAVENOUS
  Filled 2013-05-17: qty 2

## 2013-05-17 MED ORDER — NACL 0.9% IV INFUSION
INTRAVENOUS | Status: DC
Start: 2013-05-17 — End: 2013-05-17
  Administered 2013-05-17: 17:00:00 via INTRAVENOUS

## 2013-05-17 NOTE — ED Nursing Note (Signed)
Report received from Ricia RN

## 2013-05-17 NOTE — Discharge Instructions (Signed)
 Thank you for choosing [facility] for your emergency health care needs. It has been our privilege to take care of you today. Your primary complaints have been evaluated based on your history, lab tests, imaging tests and you have been treated for your symptoms and discharged home. Please take all medicines that are prescribed to you as directed (see below).  It is crucial, if you have a primary care physician, to follow up with him or her in the time frame recommended as many health conditions that seem self-limited initially may actually worsen over time.  If you do not have a primary care physician, we will outline the various resources available for you to find one.    If at any time you feel that your condition is worsening, call your doctor or return to the Emergency Department for reevaluation. CALL 911 IF YOU THINK YOU ARE HAVING A MEDICAL EMERGENCY. Return to the Emergency Department if you are unable to obtain the recommended follow-up treatment or you are not better as expected. You can call the Medical Center with questions at [phone].    Please realize that the results of some studies that you had done during your stay with [place] (such as x-rays and cultures) have only preliminarily results at this time.  Results of these studies may change as more information becomes available or as the studies are re-evaluated by other members of our health care team in the next few days. We will attempt to contact you with any important changes or additions to the studies that were obtained today, particularly if any of these results require a change in your treatment.

## 2013-05-17 NOTE — ED Nursing Note (Signed)
No changes

## 2013-05-17 NOTE — ED Nursing Note (Signed)
Pt stable, airway intact, c/o right flank pain, ER process explained.

## 2013-05-17 NOTE — ED Nursing Note (Signed)
Pt discharged by resident, pt ambulated out of the dept with no difficulty.

## 2013-05-17 NOTE — ED Nursing Note (Signed)
Pt with his girlfriend who is a pt in [facility].

## 2013-05-17 NOTE — ED Nursing Note (Signed)
No significant changes to condition, VSS, states pain is becoming intolerable.

## 2013-05-17 NOTE — ED Nursing Note (Signed)
Pt ambulated to triage with steady gait. Pt reports left low back pain, states "it's my kidneys". Pt refuses to give a urine sample until he "see's a doctor". States "if you want to do something, give me something for the pain". Skin is w,d,p. Vss. Pt in NAD at this time.

## 2013-05-17 NOTE — ED Nursing Note (Signed)
First contact with pt, pt awake and alert, gcs 15. Pt reporting X1 wk of right flank and lower abdominal pain with urinary incontinence for 1wk and stool incontinence the past three days. Pt reporting his pain is sharp and stabbing nature, 8/10 at this time. Pt reporting N/V with food intake, unable to keep anything down. Pt also reporting recent MRI showing no abnormalties. Pt placed on monitor, will continue to observe and treat.

## 2013-05-17 NOTE — ED Initial Note (Addendum)
EMERGENCY DEPARTMENT PHYSICIAN NOTE - Carl Mata       Date of Service:   05/17/2013 10:25 AM Patient's PCP: No Pcp No Pcp   Note Started: 05/17/2013 10:36 DOB: 1976/05/07             Chief Complaint   Patient presents with    Incontinence     recent back surgery with hardware in place, now urinary incontinence           The history provided by the patient.  Interpreter used: No    Carl Mata is a 37yr old male, with a past medical history significant for T-12 burst fracture s/p T12 laminectomy, T-10-L2 posterior fusion by Dr. Selena Batten on 10/27, who presents to the ED with a chief complaint of R flank pain that began several weeks ago. Pain is not located at prior surgery site. States has history of intermittent urinary incontinence which is unchanged, and was seen in ED for this 3 days ago, with MRI at that time. Carl Mata reports new onset of stool incontinence and sensory change since this imaging study. Reporting intermittent numbness, tingling ("icicle effect"), and throbbing pain in the lower abdomen and groin. Associated symptoms include spasms in the legs bilaterally, which causes occasional difficulty walking. Exacerbated by deep breathing. No changes urine odor. Endorses dysuria and difficulty urinating. Has been vomiting after some PO intake.         Quality: sharp  Location: R flank  Severity: severe  Time Course: constant  Progression: gradually worsening  Duration: 3 days  Palliative factors: nothing makes it better.  Provocative factors: walking makes it worse.  Associated symptoms: nausea, vomiting, abdominal pain, dysuria, difficulty urinating   Pertinent negatives: polyuria, weakness/numbness in lower extremities    Carl Mata reports urinary incontinence prior to receiving MRI 3 days ago. After receiving MRI, A full history, including pertinent past medical, family and social history was reviewed.    HISTORY:  There are no hospital problems to display for this patient.   Allergies   Allergen Reactions     Nsaids (Non-Steroidal Anti-Inflammatory Drug) Anaphylaxis      Past Medical History:    Unspecified mental or behavioral problem                      Unspecified asthma(493.90)                                 Past Surgical History:    Fusion thoracic posterior                       N/A 04/04/2013    Laminectomy thoracic posterior                  N/A 04/04/2013   Social History    Marital Status: SINGLE              Spouse Name:                       Years of Education:                 Number of children:               Occupational History    None on file    Social History Main Topics    Smoking Status: Current Every Day Smoker  Packs/Day: 0.50  Years: 15        Types: Cigarettes    Smokeless Status: Not on file                       Alcohol Use: No              Drug Use: Yes                Special: Methamphetamine       Comment: states Carl Mata used meth >1 yr ago    Sexual Activity: Not on file          Other Topics            Concern    None on file    Social History Narrative    As of 04/07/2013:     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 Reportedly on parole in [place]. Used to work as a Designer, fashion/clothing.    Lives in [place], currently "on the street," has a girlfriend but does not live with her. Doesn't know where Carl Mata will live next. Says his GF's worker is looking into this.    H/o alcohol dependence (reports being sober for 1 year), + tobacco, + drug use (meth; reports last use >1 year ago).    ---------------------------     Review of patient's family history indicates:    No FH spine problems [Other] *                                Review of Systems   Constitutional: Negative for fever and chills.   Gastrointestinal: Positive for nausea, vomiting and abdominal pain.        Stool incontinence. Urinary incontinence.   Endocrine: Negative for polyuria.   Genitourinary: Positive for dysuria, flank pain (Right sided) and difficulty urinating.        Numbness with  urination.   Neurological: Negative for weakness and numbness.   All other systems reviewed and are negative.      TRIAGE VITAL SIGNS:  Temp: 36.7 C (98.1 F) (05/16/13 1607)  Temp src: Oral (05/16/13 1607)  Pulse: 89 (05/16/13 1607)  BP: 118/72 mmHg (05/16/13 1607)  Resp: 18 (05/16/13 1607)  SpO2: 97 % (05/16/13 1607)  Weight: 63.5 kg (139 lb 15.9 oz) (05/16/13 1607)    Physical Exam   Nursing note and vitals reviewed.  Constitutional: Carl Mata is oriented to person, place, and time. Carl Mata appears well-developed and well-nourished.   HENT:   Head: Normocephalic and atraumatic.   Mouth/Throat: Oropharynx is clear and moist.   Eyes: EOM are normal. Pupils are equal, round, and reactive to light.   Neck: Normal range of motion. Neck supple.   Pulmonary/Chest: Effort normal and breath sounds normal. No respiratory distress. Carl Mata has no wheezes. Carl Mata has no rales. Carl Mata exhibits no tenderness.   Abdominal: Soft. Carl Mata exhibits no distension and no mass. There is tenderness. There is no rebound and no guarding.   Suprapubic TTP.   Genitourinary:   Subjectively diminished sensation of normal rectal tone.    Musculoskeletal: Normal range of motion.   Healing midline surgical incision over lower thoracic and upper lumbar spine. No erythema. No dehiscence or drainage. No significant tenderness.   CVA tenderness on right.    Lymphadenopathy:     Carl Mata has no cervical adenopathy.   Neurological: Carl Mata is  alert and oriented to person, place, and time. No cranial nerve deficit.   No sensory deficits. 5/5 strength all extremities.    Skin: Skin is warm and dry. Carl Mata is not diaphoretic.       INITIAL ASSESSMENT & PLAN, MEDICAL DECISION MAKING, ED COURSE:  Carl Mata is a 37yr old male who presents with a chief complaint of new stool incontinence, groin numbness and chronic urinary incontenance. After history and exam, I feel the diagnosis is most likely cauda equina. Differential diagnosis includes, but is not limited to kidney stones, renal colic,  pyelonephritis, UTI, muscle spasm, surgical site infection, hardware malfunction.  The patient is hemodynamically stable and will require monitoring as an immediate intervention. Initial treatment and studies to evaluate this problem will include serial exams, labs, imaging and medications.     ED Course Update: The patient was observed in the ED. The results of the ED evaluation were notable for the following:    Pertinent lab results (reviewed and interpreted independently by me):   Unremarkable CBC without leukocytosis, mild anemia. Hypokalemia with otherwise unremarkable electrolytes. Normal renal function. UA without evidence of significant infection or blood. LFTs unremarkable.        Pertinent imaging results (reviewed and interpreted independently by me):   CHEST 1 VIEW:  No cardio cardio pulmonary process   CT ABDOMEN + CT PELVIS WITHOUT CONTRAST: no renal stones of perinephric stranding     Pertinent medications:   Lorazepam (Ativan), given for muscle spasm  IVF, given for hydration   Ondansetron (Zofran), given for nausea.   Morphine IV, for analgesics.     Consults: neurological surgery consult obtained and is pending.     ED COURSE: patient noted to have metal parole tracker anklet on left lower extremity which is not MRI compatable. However, MRI is medically necessary per neurosurgery consult to evaluate for cauda equina. Therefore, patient's parole officer  Darnell Level ([place]) was contacted by [facility], and permission granted to remove anklet for the purposes of diagnostic imaging. This will be performed immediately before MRI, and tracker will remain with patient until discharge.     Further MDM  Additional data reviewed? N/A    Patient Summary:   Carl Mata is a 37yr old male who presents with stool incontinence, sensory loss in the groin and urinary inconstancy, concerning for cauda equina given recent spinal surgery. Had MRI 3 days ago without concerning findings, however symptoms have  rapidly progressed since this time. Also with persistent right flank pain which does not appear anatomically related to his surgical site which is non-tender and without evidence of infection on exam. Initially concerning for possible renal source of pain as Carl Mata has flank pain radiating to the groin, CVA TTP, and has colicky pain appears similar to renal colic. CT of the abdomen without evidence of renal stones, and diagnostic studies without evidence of occult infection or renal impairment. Patient discussed with neurological surgery, who are requesting repeat MRI of thorasic and lumbar spine, which is pending at this time. Further recommendations pending. The patient's care has been transferred to Dr. Arvilla Market at 1600.  Carl Mata remains in stable condition at the time of sign out.                                               LAST VITAL SIGNS:  Temp: 36.5 C (97.7 F) (05/17/13 0748)  Temp src: Oral (05/17/13 0748)  Pulse: 83 (05/17/13 0748)  BP: 123/69 mmHg (05/17/13 0748)  Resp: 18 (05/17/13 0748)  SpO2: 99 % (05/17/13 0748)  Weight: 63.5 kg (139 lb 15.9 oz) (05/16/13 1607)    Clinical Impression: bowel incontinence, bladder incontenance, flank pain.     Anticipated work up needed: MRI, full neurosurgery evaluation    Disposition: pending    MDM COMPLEXITY  Overall Complexity of MDM is: moderate    PRESENT ON ADMISSION:  Are any of the following four conditions present or suspected on admission: decubitus ulcer, infection from an intravascular device, infection due to an indwelling catheter, surgical site infection or pneumonia? No.    PATIENT'S GENERAL CONDITION:  Fair: Vital signs are stable and within normal limits. Patient is conscious but may be uncomfortable. Indicators are favorable.    DISCLAIMER:   This document serves as my personal record of services taken in my presence. It was created on 05/17/2013 on my behalf by Carroll Sage and Gaye Pollack, trained medical scribes.     I have reviewed this document and agree  that this note accurately reflects the history and exam findings, the patient care provided, and my medical decision making.    05/17/2013  Electronically Signed By: Andris Baumann, MD  Attending Physician, Department of Emergency Medicine  [facility]

## 2013-05-17 NOTE — ED Nursing Note (Signed)
Pt appears much more comfortable, denies any pain at this time.

## 2013-05-17 NOTE — Consults (Addendum)
Brief Consult Note    Informed about pt from Dr. Sharee Pimple, who initially discussed recommendations with ED.  Pt was just seen on 05/14/13 and an interval MRI was completed at that time.  Reported symptoms are consistent with 3-days prior and there have been significant concerns about drug seeking in this patient (while in-house and from frequent visits).  MRI reassuring from 05/14/13.  No indication for NSG intervention at this time; however, if there are continued concerns of actually interval changes, a repeat MRI can be considered. Please refer to 12/6 consultation note.    Pt should come to follow-up with Dr. Selena Batten on Monday.  Discussed with Staff.      Carl Mata. Claybon Jabs, MD, MPH  Neurological Surgery, R5  Licensed Resident  Personal pager: [phone]  Service pager: [phone]  PI# [phone]      I personally reviewed the images including MRI from 05/16/13 and agree with the resident's assessment and plan we developed.      Report electronically signed by Fernande Bras, MD. Attending

## 2013-05-25 ENCOUNTER — Emergency Department: Admission: EM | Admit: 2013-05-25 | Discharge: 2013-05-26 | Discharge status: Against Medical Advice | Payer: MEDICAID

## 2013-05-25 NOTE — ED Nursing Note (Signed)
Steady gait. Same complaints long standing bladder incontinence.

## 2013-05-25 NOTE — ED Triage Note (Signed)
37 yo male co chronic low back pain with 2 weeks of intermittent bladder incontinence, pt states "i sometimes go by assident on myself", pt stats he is able to void sometimes in the tiolet, denies numbness or tingling to lower extremities or genitalia, denies fecal incontinence.  Pt states he has tried to contact his spine surgeon without any response.  Pt is ambulatory with a steady gait.

## 2013-05-26 ENCOUNTER — Emergency Department
Admission: EM | Admit: 2013-05-26 | Discharge: 2013-05-27 | Disposition: A | Discharge status: Home / Self Care | Payer: MEDICAID | Attending: Personal Emergency Response Attendant | Admitting: Personal Emergency Response Attendant

## 2013-05-26 DIAGNOSIS — M549 Dorsalgia, unspecified: Secondary | ICD-10-CM | POA: Insufficient documentation

## 2013-05-26 DIAGNOSIS — Z9889 Other specified postprocedural states: Secondary | ICD-10-CM | POA: Insufficient documentation

## 2013-05-26 DIAGNOSIS — M545 Low back pain, unspecified: Secondary | ICD-10-CM | POA: Insufficient documentation

## 2013-05-26 DIAGNOSIS — Z87828 Personal history of other (healed) physical injury and trauma: Secondary | ICD-10-CM | POA: Insufficient documentation

## 2013-05-26 NOTE — ED Nursing Note (Signed)
Pt ambulatory to triage, reports still sore. Pt noted to be sleeping in WR in between re-vitals. Breathing is even and unlabored. Skin wnl.

## 2013-05-26 NOTE — ED Nursing Note (Signed)
Pt amb w/ upright gait states, gcs 15 no distress

## 2013-05-26 NOTE — ED Nursing Note (Signed)
Pt called overhead to be given supplies for UA. NA via overhead paging system; pt not located in [place].

## 2013-05-26 NOTE — ED Triage Note (Signed)
Pt to triage; given supplies for UA. Pt very concerned re: time to be seen. Repeatedly states "I have a lot of stuff to do today". Pt walks to bathroom, comes back, gives empty specimen container to nurse and says, "I'm outta here. This is taking too long". Gait steady.

## 2013-05-26 NOTE — ED Nursing Note (Signed)
Pt. Angry arousing from sleep. Steady gait to triage. Same c/o R flank pain. Go back to ED WR and sleep.

## 2013-05-26 NOTE — ED Nursing Note (Signed)
Pt. Sleeping in front of triage desk chair WR. Respi. Even and nl. Skin color good. Easy to arouse. Pt. Declining for vitals . Angry that being awaken .

## 2013-05-26 NOTE — ED Triage Note (Signed)
Pt w/ back pain s/p surg 8-9 wks ago states has hardware state ran out of pain meds unable to get f/u per medics abl eto ambulate on scene gcs 15

## 2013-05-27 ENCOUNTER — Emergency Department (EMERGENCY_DEPARTMENT_HOSPITAL): Payer: MEDICAID

## 2013-05-27 DIAGNOSIS — M549 Dorsalgia, unspecified: Secondary | ICD-10-CM | POA: Insufficient documentation

## 2013-05-27 MED ORDER — OXYCODONE-ACETAMINOPHEN 5 MG-325 MG TABLET
2.0000 | ORAL_TABLET | Freq: Once | ORAL | Status: AC
Start: 2013-05-27 — End: 2013-05-27
  Administered 2013-05-27: 2 via ORAL
  Filled 2013-05-27: qty 2

## 2013-05-27 MED ORDER — OXYCODONE-ACETAMINOPHEN 5 MG-325 MG TABLET
2.0000 | ORAL_TABLET | Freq: Four times a day (QID) | ORAL | Status: AC | PRN
Start: 2013-05-27 — End: 2013-06-03

## 2013-05-27 NOTE — ED Initial Note (Signed)
EMERGENCY DEPARTMENT PHYSICIAN NOTE - Carl Mata       Date of Service:   05/26/2013  7:00 PM Patient's PCP: Roe Coombs   Note Started: 05/27/2013 05:04 DOB: 1976-04-08             Chief Complaint   Patient presents with    Back Pain Acute           The history provided by the patient.  Interpreter used: No    Carl Mata is a 37yr old male, with a past medical history significant for back surgery after trauma  8 weeks ago, who presents to the ED with a chief complaint of 7/10 low back for 3/4 days -----certain movements make his symptoms worse.  Running out of pain meds makes his symptoms worse.     Patient complains of low back pain.  No direct trauma.  No lifting.  Patient ran out of pain meds several days ago------ his back pain worsened several days ago. No specific motor weakness.  No numbness/tingling.  No urinary or fecal incontinence in our ED. He cannot remember an event which made his back pain worse.     No fever/chills. No weight loss. No IV drug use. No H/O cancer      A full history, including pertinent past medical, family and social history was reviewed.    HISTORY:    Current outpatient prescriptions:ALBUTEROL INHA, , Disp: , Rfl: ;  Cyclobenzaprine (FLEXERIL) 10 mg Tablet, Take 1 tablet by mouth every 8 hours., Disp: 90 tablet, Rfl: 0;  Diazepam (VALIUM) 5 mg Tablet, Take 1 tablet by mouth every 8 hours if needed for spasms., Disp: 50 tablet, Rfl: 0;  Diazepam (VALIUM) 5 mg Tablet, Take 1 tablet by mouth every 8 hours if needed for spasm., Disp: 75 tablet, Rfl: 0  Docusate (COLACE) 100 mg Capsule, Take 1 capsule by mouth 2 times daily., Disp: 60 capsule, Rfl: 2;  Docusate (COLACE) 100 mg Capsule, Take 1 capsule by mouth 2 times daily for constipation., Disp: 50 capsule, Rfl: 0;  Oxycodone 20 mg Tablet, Take 1 tablet by mouth every 6 hours if needed for pain., Disp: 75 tablet, Rfl: 0;  Sennosides (SENOKOT) 8.6 mg Tablet, Take 2 tablets by mouth every day at bedtime if needed., Disp: 50  tablet, Rfl: 0   Allergies   Allergen Reactions    Nsaids (Non-Steroidal Anti-Inflammatory Drug) Anaphylaxis      Past Medical History:    Unspecified mental or behavioral problem                      Unspecified asthma(493.90)                                 Past Surgical History:    Fusion thoracic posterior                       N/A 04/04/2013    Laminectomy thoracic posterior                  N/A 04/04/2013   Social History    Marital Status: SINGLE              Spouse Name:                       Years of Education:  Number of children:               Occupational History    None on file    Social History Main Topics    Smoking Status: Current Every Day Smoker        Packs/Day: 0.50  Years: 15        Types: Cigarettes    Smokeless Status: Not on file                       Alcohol Use: No              Drug Use: Yes                Special: Methamphetamine       Comment: states he used meth >1 yr ago    Sexual Activity: Not on file          Other Topics            Concern    None on file    Social History Narrative    As of 04/07/2013:     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 Reportedly on parole in [place]. Used to work as a Designer, fashion/clothing.    Lives in [place], currently "on the street," has a girlfriend but does not live with her. Doesn't know where he will live next. Says his GF's worker is looking into this.    H/o alcohol dependence (reports being sober for 1 year), + tobacco, + drug use (meth; reports last use >1 year ago).    ---------------------------     Review of patient's family history indicates:    No FH spine problems [Other] *                         FAMILY HISTORY--negative for diabetes             Review of Systems   Constitutional: Negative for fever.   HENT: Negative for congestion and sore throat.    Eyes: Negative for pain.   Respiratory: Negative for cough and shortness of breath.    Cardiovascular: Negative for chest pain.    Gastrointestinal: Negative for abdominal pain.   Genitourinary: Negative for dysuria and hematuria.   Musculoskeletal: Positive for back pain. Negative for neck pain.   Neurological: Negative for weakness and numbness.   All other systems reviewed and are negative.        TRIAGE VITAL SIGNS:  Temp: 36.2 C (97.2 F) (05/26/13 1729)  Temp src: Oral (05/26/13 1729)  Pulse: 94 (05/26/13 1729)  BP: 118/87 mmHg (05/26/13 1729)  Resp: 16 (05/26/13 1729)  SpO2: 97 % (05/26/13 1729)  Weight: (not recorded)    Physical Exam   Constitutional: He is oriented to person, place, and time. He appears well-developed and well-nourished.   HENT:   Head: Normocephalic and atraumatic.   Right Ear: External ear normal.   Left Ear: External ear normal.   Mouth/Throat: No oropharyngeal exudate.   Eyes: EOM are normal. Pupils are equal, round, and reactive to light.   Neck: Normal range of motion. Neck supple. No JVD present.   Cardiovascular: Normal rate, regular rhythm, normal heart sounds and intact distal pulses.    Pulmonary/Chest: Effort normal and breath sounds normal. He has no wheezes. He has no rales.   Abdominal: Soft. Bowel sounds are normal. He exhibits no distension. There  is no tenderness. There is no rebound and no guarding.   Musculoskeletal: Normal range of motion. He exhibits no edema and no tenderness.   No sinus tenderness      No CVA tenderness  Legs symmetric in size/nontender/no erythema  Extremities are warm and perfused with symmetric distal pulses and positive cap refill    Motor 5/5    No drift  Sensory intact  +steady gait  +perianal sensation intact  +normal sphincter tone       Neurological: He is alert and oriented to person, place, and time.   Skin: Skin is warm and dry. No rash noted. No erythema.           INITIAL ASSESSMENT & PLAN, MEDICAL DECISION MAKING, ED COURSE:  Carl Mata is a 37yr old male who presents with a chief complaint of back pain----he had recent back surgery    Differential  diagnosis includes hardware failure,  fracture, infection, strain, other        ADDENDUM---VSS. Pain improved with percocet.    No neuro deficit     +steady gait    Patient presents with back pain---he had recent surgery after trauma. Xrays show hardware in place----no hardware failure. No trauma----no signs of fracture. No clinical signs of infection-----no fever, no skin cellulitis. R/O back strain---denies lifting. Overall-----no neuro deficit, no incontinence. Patient with no red flags-----no fever, no drug use, no weight loss, no cancer.     Patient appears comfortable---he repeatedly asks for pain meds. Percoce helps his pain. He states he has primary care appointment next Monday. Plan is limited percocet, rest, no lifting, close f/up with primary MD.    Of note----patient had recent ED evaluation including stable MRI    Supportive care continues.        ADDENDUm-----VSS. Patient feels much better    No motor or sensory deficit    Patient's symptoms improved.. Patient taking POs with no problems. Patient ambulatory with no problems. Stable for discharge. Patient understands he requires close follow-up.     Patient to return for worse pain, recurrent pain, fever, chills, NV, numbness/tingling, motor weakness, loss of bowel or bladder function, any problems.    During the patient's Emergency Department's course, I performed repeated re-assessments to assess clinical condition and response to therapy. This included interval histories and focused re-examinations. I reviewed the patient's course multiple occasions with the housestaff.         ED Course Update: The patient was observed in the ED. The results of the ED evaluation were notable for the following:    Pertinent imaging results (reviewed and interpreted independently by me):   Spine film---hardware intact, no new findings    Pertinent medications: percocet given for pain      Patient Summary: Patient presents with back pain -----recent back surgery after  trauma-----no new trauma, no heavy lifting.  No neuro deficit on serial exams, no incontinence. Xrays show hardware in place. Percocet helps pain. No red flags--------no fever, no signs of infection, no weight loss, no cancer, no IV drug use. Plan is rest, no lifting, PO percocet, close outpatient f/up.         LAST VITAL SIGNS:  Temp: 36.5 C (97.7 F) (05/26/13 2243)  Temp src: Oral (05/26/13 2243)  Pulse: 85 (05/26/13 2243)  BP: 113/72 mmHg (05/26/13 2243)  Resp: 16 (05/26/13 2243)  SpO2: 100 % (05/26/13 2243)  Weight: (not recorded)      Clinical Impression:   1. Acute musculoskeletal  back pain  2. Recent back surgery----hardware intact      Anticipated work up needed: close f/up with primary MD    Disposition: home    PRESENT ON ADMISSION:  Are any of the following four conditions present or suspected on admission: decubitus ulcer, infection from an intravascular device, infection due to an indwelling catheter, surgical site infection or pneumonia? No.    PATIENT'S GENERAL CONDITION:  The patient is to be discharged home in stable and inproved condition for outpatient follow-up. The patient is ambulatory and tolerating PO intake. Patient has been given appropriate discharge instructions. Patient understands symptoms to watch for and patient will return for any worsening/recurrent symptoms.      Report electronically signed by:  Ree Kida, MD Attending Physician    Patient seen, evaluated, and care plan was developed by myself. The above note was created by myself.   Chozen Folks. Natori Gudino, MD

## 2013-05-27 NOTE — ED Nursing Note (Signed)
Patient discharged from ED with AVS, Rx, related instructions and all belongings. Patient is in NAD, is awake/alert skin, is pink/warm/dry. Pt leaves the ED ambulatory with self.

## 2013-06-06 ENCOUNTER — Ambulatory Visit: Payer: MEDICAID | Admitting: Neurological Surgery

## 2013-07-06 ENCOUNTER — Emergency Department: Admission: EM | Admit: 2013-07-06 | Discharge: 2013-07-07 | Discharge status: Against Medical Advice | Payer: MEDICAID

## 2013-07-06 DIAGNOSIS — Z029 Encounter for administrative examinations, unspecified: Secondary | ICD-10-CM

## 2013-07-06 NOTE — ED Nursing Note (Signed)
No Answer" note: The patient was called using the ED PA system. The patient was not found after a search of the ED Waiting Room Areas.

## 2013-07-06 NOTE — ED Triage Note (Signed)
Pt ambulatory to triage, states was unloading truck, slipped and fell backwards landing on R wrist.  Pt states felt "full impact of body" on his R wrist.  Minor swelling noted, able to make fist with hands, but unable to rotate R wrist, csm intact, strong R radial pulse noted. ICE packs applied for comfort. No other c/o.

## 2013-07-07 NOTE — ED Nursing Note (Signed)
No Answer" note: The patient was called using the ED PA system. The patient was not found after a search of the ED Waiting Room Areas.

## 2013-07-27 DIAGNOSIS — F17200 Nicotine dependence, unspecified, uncomplicated: Secondary | ICD-10-CM

## 2013-07-27 DIAGNOSIS — M545 Low back pain, unspecified: Secondary | ICD-10-CM

## 2013-07-27 DIAGNOSIS — IMO0002 Reserved for concepts with insufficient information to code with codable children: Principal | ICD-10-CM | POA: Insufficient documentation

## 2013-07-27 DIAGNOSIS — G8929 Other chronic pain: Secondary | ICD-10-CM

## 2013-07-27 DIAGNOSIS — R52 Pain, unspecified: Secondary | ICD-10-CM

## 2013-07-27 NOTE — ED Nursing Note (Signed)
Ambulates steady gait.

## 2013-07-27 NOTE — ED Triage Note (Signed)
Larey SeatFell of his truck 5 days ago and landed on his buttocks, feels numb/tingly sensation lower extremities, reported urinary incontinence as well. Hx Unstable burst fx of fourth thoracic vertebra a year ago(9 foot fall), pain is achy/pressure,lying down makes it better,[facility] and [facility],result was inconclusive.

## 2013-07-28 ENCOUNTER — Encounter: Payer: Self-pay | Admitting: Student in an Organized Health Care Education/Training Program

## 2013-07-28 ENCOUNTER — Emergency Department
Admission: EM | Admit: 2013-07-28 | Discharge: 2013-07-28 | Disposition: A | Discharge status: Home / Self Care | Payer: MEDICAID | Attending: Emergency Medicine | Admitting: Emergency Medicine

## 2013-07-28 ENCOUNTER — Emergency Department (EMERGENCY_DEPARTMENT_HOSPITAL): Payer: MEDICAID

## 2013-07-28 DIAGNOSIS — M549 Dorsalgia, unspecified: Secondary | ICD-10-CM

## 2013-07-28 DIAGNOSIS — G8929 Other chronic pain: Secondary | ICD-10-CM

## 2013-07-28 DIAGNOSIS — IMO0002 Reserved for concepts with insufficient information to code with codable children: Secondary | ICD-10-CM

## 2013-07-28 MED ORDER — DIAZEPAM 5 MG TABLET
5.0000 mg | ORAL_TABLET | Freq: Once | ORAL | Status: AC
Start: 2013-07-28 — End: 2013-07-28
  Administered 2013-07-28: 5 mg via ORAL
  Filled 2013-07-28: qty 1

## 2013-07-28 MED ORDER — OXYCODONE-ACETAMINOPHEN 5 MG-325 MG TABLET
2.0000 | ORAL_TABLET | Freq: Once | ORAL | Status: AC
Start: 2013-07-28 — End: 2013-07-28
  Administered 2013-07-28: 2 via ORAL
  Filled 2013-07-28: qty 2

## 2013-07-28 NOTE — ED Nursing Note (Signed)
Pt stable medicated with a muscle relaxer, prior to medication being administered patient called place of domicile for pick up.

## 2013-07-28 NOTE — ED Nursing Note (Signed)
Pt's Vital Signs Stable, No Acute Distress, verbalized understanding of discharge instructions, as well as follow up instructions, pt discharged home at this time.

## 2013-07-28 NOTE — ED Nursing Note (Signed)
Pt states pain decreasing and improving with medications, will monitor.

## 2013-07-28 NOTE — Discharge Instructions (Signed)
Thank you for choosing [facility].  It has been our privilege to take care of you today.  You have been evaluated/treated for back pain and will be discharged to home. Please take all medicines that are prescribed to you as directed.  If you have a primary care physician it is crucial to follow up with him or her in the time frame recommended, as many health conditions that seem self-limited initially may actually worsen over time.  If you do not have a primary care physician, simply ask and we will outline the various resources available for you to find one.     If at any time you feel that your condition is worsening, call your doctor or return to the emergency department for reevaluation.  If you feel that you are having a life threatening illness, please call 911 immediately.    Please realize that the results of some studies that you had done during your stay with us (such as xrays and cultures) are only preliminarily resulted.  Radiology results will be viewed by a radiologist and final reads may change. We will attempt to contact you with any important changes or additions to the studies that were obtained today.     No driving while taking opiate pain medications.  If given a prescription for antibiotics, please take exactly as prescribed.  Further prescription refills must be obtained from your primary care physician.

## 2013-07-28 NOTE — ED Initial Note (Signed)
EMERGENCY DEPARTMENT PHYSICIAN NOTE - Carl Mata       Date of Service:   07/28/2013  3:02 AM Patient's PCP: No Pcp No Pcp   Note Started: 07/28/2013 03:39 DOB: [birth year]             Chief Complaint   Patient presents with    Back Pain Acute           The history provided by the patient.  Interpreter used: No    Carl Mata is a 38yr old male, with a past medical history significant for T 12 burst fracture s/p surgical fixation in 04/2013, who presents to the ED with a chief complaint of acute on chronic back pain that began approx 1 week ago. Patient states that he fell off of a truck 1 wk ago.  No LOC and was ambulatory on scene.  He presented to [facility] 3 days ago and states that an MRI was done but he left prior to it being complete.  He states that he has had intermittent urinary incontinence since then, but denies urine or stool retention.  He also reports bilateral feet paresthesias but has normal gait.  Denies fever or IVDU.  Denies saddle anesthesia.       Patient has had multiple ED visits for similar back pain episodes.      Quality: aching  Location: lower back  Severity: 10/10  Time Course: constant  Progression: gradually worsening  Palliative factors: nothing makes it better.  Provocative factors: exertion makes it worse.    A full history, including pertinent past medical, family and social history was reviewed.    HISTORY:  There are no hospital problems to display for this patient.   Allergies   Allergen Reactions    Nsaids (Non-Steroidal Anti-Inflammatory Drug) Anaphylaxis      Past Medical History:    Unspecified mental or behavioral problem                      Unspecified asthma(493.90)                                 Past Surgical History:    Fusion thoracic posterior                       N/A 04/04/2013    Laminectomy thoracic posterior                  N/A 04/04/2013   Social History    Marital Status: SINGLE              Spouse Name:                        Years of Education:                 Number of children:               Occupational History    None on file    Social History Main Topics    Smoking Status: Current Every Day Smoker        Packs/Day: 0.50  Years: 15        Types: Cigarettes    Smokeless Status: Not on file                       Alcohol  Use: No              Drug Use: Yes                Special: Methamphetamine       Comment: states he used meth >1 yr ago    Sexual Activity: Not on file          Other Topics            Concern    None on file    Social History Narrative    As of 04/07/2013:     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 Reportedly on parole in [place]. Used to work as a Designer, fashion/clothing.    Lives in [place], currently "on the street," has a girlfriend but does not live with her. Doesn't know where he will live next. Says his GF's worker is looking into this.    H/o alcohol dependence (reports being sober for 1 year), + tobacco, + drug use (meth; reports last use >1 year ago).    ---------------------------     Review of patient's family history indicates:    No FH spine problems [Other] *                                    Review of Systems   Constitutional: Negative for fever.   Respiratory: Negative for shortness of breath.    Cardiovascular: Negative for chest pain.   Gastrointestinal: Negative for abdominal pain.   Musculoskeletal: Positive for back pain. Negative for gait problem, joint swelling and neck pain.   Skin: Negative for rash and wound.   Neurological: Positive for numbness. Negative for headaches.   All other systems reviewed and are negative.        TRIAGE VITAL SIGNS:  Temp: 36.7 C (98.1 F) (07/27/13 2044)  Temp src: Oral (07/27/13 2044)  Pulse: 80 (07/27/13 2044)  BP: 127/78 mmHg (07/27/13 2044)  Resp: 18 (07/27/13 2044)  SpO2: 99 % (07/27/13 2044)  Weight: 73.5 kg (162 lb 0.6 oz) (07/27/13 2044)    Physical Exam   Nursing note and vitals reviewed.   Constitutional: He is oriented to person, place, and time. He appears well-developed and well-nourished. No distress.   HENT:   Head: Normocephalic and atraumatic.   Mouth/Throat: No oropharyngeal exudate.   Eyes: Conjunctivae are normal. Pupils are equal, round, and reactive to light.   Neck: Normal range of motion. Neck supple.   Cardiovascular: Normal rate, regular rhythm, normal heart sounds and intact distal pulses.    Pulmonary/Chest: Effort normal and breath sounds normal. No respiratory distress.   Abdominal: Soft. Bowel sounds are normal. He exhibits no distension. There is no tenderness.   Musculoskeletal: Normal range of motion. He exhibits tenderness (diffuse midline and paraspinal lumbar tenderness.  FROM.  Negative straight leg test.  ). He exhibits no edema.   Neurological: He is alert and oriented to person, place, and time. He exhibits normal muscle tone.   Normal EHL strength.  Sensation to pinprick and light touch intact and symmetric in the lower extremities   Skin: Skin is warm and dry. No rash noted. He is not diaphoretic. No erythema.   Psychiatric: He has a normal mood and affect.           INITIAL ASSESSMENT & PLAN, MEDICAL DECISION MAKING, ED COURSE:  Emergency Department  Course    Imaging (reviewed and independently interpreted by me):   XR L spine 2 view: no new fracture or malalignment, hardware appears in tact  XR T spine 2 view: no new fracture or malalignment, hardware appears in tact    Progress note(s):  04:50: percocet given.  Still with mild/mod pain.  Will give PO valium for any underlying spasm.      Chart Review:   I reviewed the patient's past medical records including their [facility] as well as MRI L spine from 05/17/2013 from [facility], which revealed essentially no change, specifically there is no evidence of spinal cord compression to explain the patient's reports of urinary incontinence and bilateral lower extremity pain and paresthesias.   On chart review, the patient has multiple ED visits where he ran out of his pain medicines and had similar complaints of urine incontinence and lower extremity paresthesias.  He was told previously that he could not get pain medicine refills in the ED.       Medical Decision Making  Darryl LentMatthew T Sher is a [age]yr male who presents with acute on chronic back pain in the setting of a fall 1 week ago.  The patient was initially hemodynamically stable.  Their workup was significant for no evidence of new fracture or neurologic injury.    The patient was given percocet and valium, which resulted in symptom control.   Patient has minimal tenderness on repeat exam.   Ambulating with steady gait.  During this time, the patient's condition has been clinically an hemodynamically stable.      The patient's presentation is most consistent with acute on chronic back pain s/p fall.  No evidence of new spinal cord compression or fracture.    Based on the test results, reponse to therapy, and multiple re-evaluations (focused exams and repeat histories), the patient will be discharged    Pt understands need to return ASAP if any worsening of condition.  DC to f/up with PMD in 1-2 days for analgesic refills.   The patient's questions have been answered.            LAST VITAL SIGNS:  Temp: 36.6 C (97.9 F) (07/28/13 0132)  Temp src: Oral (07/28/13 0132)  Pulse: 83 (07/28/13 0132)  BP: 116/78 mmHg (07/28/13 0132)  Resp: 18 (07/28/13 0132)  SpO2: 99 % (07/28/13 0132)  Weight: 73.5 kg (162 lb 0.6 oz) (07/27/13 2044)      Clinical Impression:   1. Acute on chronic low back pain  2. History of vertebral burst fracture requiring surgical fixation    Anticipated work up needed: none      Disposition: Discharge. Follow up with PMD. ED discharge instructions were reviewed and provided. It was discussed that radiology reports are preliminary and the patient will be contacted for any changes in interpretation.        MDM COMPLEXITY   Overall Complexity of MDM is: moderate          PRESENT ON ADMISSION:  Are any of the following four conditions present or suspected on admission: decubitus ulcer, infection from an intravascular device, infection due to an indwelling catheter, surgical site infection or pneumonia? No.    PATIENT'S GENERAL CONDITION:  Fair: Vital signs are stable and within normal limits. Patient is conscious but may be uncomfortable. Indicators are favorable.      Report electronically signed by:  Rita OharaJames Luz, MD Resident    Report electronically signed by: Rolm GalaERIK  G. Mickie Kay, MD  Attending Physician    This patient was seen, evaluated, and care plan was developed with the resident.  I agree with the findings and plan as outlined in our combined note.      Romona Curls, MD

## 2013-07-28 NOTE — ED Nursing Note (Signed)
Pt's Vital Signs Stable, No Acute Distress, verbalized understanding of discharge instructions, as well as follow up instructions, pt discharged home at this time with transportation.

## 2013-07-28 NOTE — ED Nursing Note (Signed)
Pt ambulatory to room with steady gait. Continues to c/o lower back pain, but controlled. CSM intact distally.

## 2013-08-02 ENCOUNTER — Emergency Department
Admission: EM | Admit: 2013-08-02 | Discharge: 2013-08-02 | Discharge status: Against Medical Advice | Payer: MEDICAID | Attending: Emergency Medicine | Admitting: Emergency Medicine

## 2013-08-02 DIAGNOSIS — G8929 Other chronic pain: Secondary | ICD-10-CM | POA: Insufficient documentation

## 2013-08-02 DIAGNOSIS — M549 Dorsalgia, unspecified: Secondary | ICD-10-CM | POA: Insufficient documentation

## 2013-08-02 NOTE — ED Nursing Note (Signed)
No Answer" note: The patient was called using the ED PA system. The patient was not found after a search of the ED Waiting Room Areas.

## 2013-08-02 NOTE — ED Progress Note (Signed)
Emergency Department Triage Note     Subjective: Carl Mata is a [age]yr old male who presents to the ED with a chief complaint of chronic back pain.  The patient has hx of back pain, hx of back surgery, had MRI in January that was unremarkable, plain films a few days ago, no acute change.  States terrible pain, cant walk has intermittent incontinence, but none now, walked into triage without issue.     Patients phone number confirmed.     Objective (pertinent exam findings):    BP 127/81   Pulse 104   Temp(Src) 36.5 C (97.7 F) (Oral)   Resp 16   Wt 73.7 kg (162 lb 7.7 oz)   BMI 24.71 kg/m2   SpO2 100%     General: No acute distress, well appearing.  Neuro: ambulates well to triage, good strength all 4 extremities.      Assessment: Carl Mata is a [age]yr old male with back pain, chronic.  Low suspicion for acute issue, cauda equina.      Several serious diagnoses could explain the patient's symptoms, however at this time the patient has stable vital signs, appears well and is in no acute distress indicating that the patient is not at risk for death or immediate deterioration.   It appears to be safe for the patient to await an available room in the emergency department, however my evaluation is incomplete and directed at starting the patient's workup.  The patient will be further evaluated and managed in the main emergency department.       Plan: Initial studies to evaluate this problem will include assessment. Further workup will then proceed in up front care. ?    The patient will be brought back to a room when available for further evaluation.  Seen and initially evaluated by:  Ninfa LindenMichael Aaron Shulamit Donofrio, DO, Attending Physician, Department of Emergency Medicine       This patient was seen by a physician in triage. Please see the ED Initial Note for full details of this patient's care. This note covers the brief initial evaluation performed to obtain initial  studies to expedite the patient's care. It is not intended to be a comprehensive document of the patient's ED visit.    This patient was instructed that this preliminary assessment and any studies obtained do not constitute a full workup of the patient's chief complaint. The patient was instructed to wait in the assigned area after the triage evaluation to be reevaluated by a physician after initial studies are completed.

## 2013-08-02 NOTE — ED Triage Note (Signed)
38 yo male co entire back pain since fall one year ago with surgery for herniated disc, pt states xray here recently with MRI and normal results, pt co back pain and urinary incontinence for one, last seen here one week ago with onset of urinary issues with imaging.

## 2013-08-02 NOTE — ED Nursing Note (Signed)
No answer outside or in wr.

## 2014-11-30 ENCOUNTER — Emergency Department (EMERGENCY_DEPARTMENT_HOSPITAL): Payer: MEDICAID

## 2014-11-30 ENCOUNTER — Emergency Department
Admission: EM | Admit: 2014-11-30 | Discharge: 2014-11-30 | Disposition: A | Discharge status: Home / Self Care | Payer: MEDICAID | Attending: Emergency Medicine | Admitting: Emergency Medicine

## 2014-11-30 ENCOUNTER — Emergency Department: Payer: MEDICAID

## 2014-11-30 DIAGNOSIS — I498 Other specified cardiac arrhythmias: Secondary | ICD-10-CM

## 2014-11-30 DIAGNOSIS — W19XXXA Unspecified fall, initial encounter: Secondary | ICD-10-CM | POA: Insufficient documentation

## 2014-11-30 DIAGNOSIS — Y93K1 Activity, walking an animal: Secondary | ICD-10-CM | POA: Insufficient documentation

## 2014-11-30 DIAGNOSIS — R079 Chest pain, unspecified: Secondary | ICD-10-CM

## 2014-11-30 DIAGNOSIS — M5136 Other intervertebral disc degeneration, lumbar region: Secondary | ICD-10-CM

## 2014-11-30 DIAGNOSIS — M546 Pain in thoracic spine: Secondary | ICD-10-CM | POA: Insufficient documentation

## 2014-11-30 DIAGNOSIS — F1510 Other stimulant abuse, uncomplicated: Secondary | ICD-10-CM | POA: Insufficient documentation

## 2014-11-30 DIAGNOSIS — R42 Dizziness and giddiness: Secondary | ICD-10-CM

## 2014-11-30 DIAGNOSIS — F17210 Nicotine dependence, cigarettes, uncomplicated: Secondary | ICD-10-CM | POA: Insufficient documentation

## 2014-11-30 DIAGNOSIS — W1809XA Striking against other object with subsequent fall, initial encounter: Secondary | ICD-10-CM | POA: Insufficient documentation

## 2014-11-30 DIAGNOSIS — Y929 Unspecified place or not applicable: Secondary | ICD-10-CM | POA: Insufficient documentation

## 2014-11-30 LAB — URINALYSIS-COMPLETE
BILIRUBIN URINE: NEGATIVE
GLUCOSE URINE: NEGATIVE mg/dL
KETONES: NEGATIVE mg/dL
LEUK. ESTERASE: NEGATIVE
NITRITE URINE: NEGATIVE
OCCULT BLOOD URINE: NEGATIVE mg/dL
PH URINE: 6 (ref 4.8–7.8)
PROTEIN URINE: NEGATIVE mg/dL
SPECIFIC GRAVITY: 1.01 (ref 1.002–1.030)
UROBILINOGEN.: NEGATIVE mg/dL (ref ?–2.0)

## 2014-11-30 LAB — CBC WITH DIFFERENTIAL
BASOPHILS % AUTO: 0.5 %
BASOPHILS ABS AUTO: 0 10*3/uL (ref 0–0.2)
EOSINOPHIL % AUTO: 3 %
EOSINOPHIL ABS AUTO: 0.3 10*3/uL (ref 0–0.5)
HEMATOCRIT: 46.1 % (ref 40–52)
HEMOGLOBIN: 15.7 g/dL (ref 14.0–18.0)
LYMPHOCYTE ABS AUTO: 2.9 10*3/uL (ref 1.0–4.8)
LYMPHOCYTES % AUTO: 33.5 %
MCH: 33.1 pg — AB (ref 27–33)
MCHC: 34 % (ref 32–36)
MCV: 97.4 UM3 (ref 80–100)
MONOCYTES % AUTO: 6.4 %
MONOCYTES ABS AUTO: 0.6 10*3/uL (ref 0.1–0.8)
MPV: 8.2 UM3 (ref 6.8–10.0)
NEUTROPHIL ABS AUTO: 4.9 10*3/uL (ref 1.80–7.70)
NEUTROPHILS % AUTO: 56.6 %
PLATELET COUNT: 273 10*3/uL (ref 130–400)
RDW: 13.8 U (ref 0–14.7)
RED CELL COUNT: 4.73 10*6/uL (ref 4.5–5.9)
WHITE BLOOD CELL COUNT: 8.7 10*3/uL (ref 4.5–11.0)

## 2014-11-30 LAB — UR DRUGS OF ABUSE SCREEN
AMPHETAMINE SCREEN, URINE: POSITIVE ng/mL — AB
BARBITURATES SCREEN, URINE: NEGATIVE ng/mL
BENZODIAZEPINES SCREEN, URINE: NEGATIVE ng/mL
COCAINE METABOLITE SCRN, URINE: NEGATIVE ng/mL
OPIATES SCREEN, URINE: POSITIVE ng/mL — AB

## 2014-11-30 LAB — BASIC METABOLIC PANEL
CALCIUM: 9.1 mg/dL (ref 8.6–10.5)
CARBON DIOXIDE TOTAL: 29 meq/L (ref 24–32)
CHLORIDE: 99 meq/L (ref 95–110)
CREATININE BLOOD: 1.13 mg/dL (ref 0.44–1.27)
GLUCOSE: 95 mg/dL (ref 70–99)
POTASSIUM: 4.5 meq/L (ref 3.3–5.0)
SODIUM: 135 meq/L (ref 135–145)
UREA NITROGEN, BLOOD (BUN): 11 mg/dL (ref 8–22)

## 2014-11-30 LAB — APTT STUDIES: APTT: 29.9 s (ref 24.1–36.7)

## 2014-11-30 LAB — INR: INR: 0.95 (ref 0.87–1.18)

## 2014-11-30 LAB — MAGNESIUM (MG): MAGNESIUM (MG): 2.1 mg/dL (ref 1.5–2.6)

## 2014-11-30 LAB — C-REACTIVE PROTEIN: C-REACTIVE PROTEIN: 0.1 mg/dL (ref 0–0.8)

## 2014-11-30 LAB — SED RATE WESTERGREN: SED RATE WESTERGREN: 2 mm/h (ref <=15)

## 2014-11-30 LAB — PHOSPHORUS (PO4): PHOSPHORUS (PO4): 4.4 mg/dL (ref 2.4–5.0)

## 2014-11-30 MED ORDER — HYDROMORPHONE 1 MG/ML INJECTION SYRINGE
0.5000 mg | INJECTION | INTRAMUSCULAR | Status: DC | PRN
Start: 2014-11-30 — End: 2014-11-30
  Administered 2014-11-30 (×3): 0.5 mg via INTRAVENOUS
  Filled 2014-11-30 (×3): qty 1

## 2014-11-30 MED ORDER — DIAZEPAM 5 MG TABLET
5.0000 mg | ORAL_TABLET | Freq: Three times a day (TID) | ORAL | Status: AC | PRN
Start: 2014-11-30 — End: 2014-12-05

## 2014-11-30 MED ORDER — LORAZEPAM 2 MG/ML INJECTION SOLUTION
1.0000 mg | INTRAMUSCULAR | Status: DC | PRN
Start: 2014-11-30 — End: 2014-12-01
  Administered 2014-11-30: 1 mg via INTRAVENOUS
  Filled 2014-11-30: qty 1

## 2014-11-30 MED ORDER — OXYCODONE-ACETAMINOPHEN 5 MG-325 MG TABLET
1.0000 | ORAL_TABLET | Freq: Four times a day (QID) | ORAL | Status: AC | PRN
Start: 2014-11-30 — End: 2014-12-05

## 2014-11-30 MED ORDER — HYDROMORPHONE 1 MG/ML INJECTION SYRINGE
1.0000 mg | INJECTION | INTRAMUSCULAR | Status: DC | PRN
Start: 2014-11-30 — End: 2014-12-01
  Administered 2014-11-30 (×2): 1 mg via INTRAVENOUS
  Filled 2014-11-30 (×2): qty 1

## 2014-11-30 MED ORDER — ONDANSETRON HCL (PF) 4 MG/2 ML INJECTION SOLUTION
4.0000 mg | Freq: Once | INTRAMUSCULAR | Status: AC
Start: 2014-11-30 — End: 2014-11-30
  Administered 2014-11-30: 4 mg via INTRAVENOUS
  Filled 2014-11-30: qty 2

## 2014-11-30 MED ORDER — NACL 0.9% IV BOLUS - DURATION REQ
1000.0000 mL | Freq: Once | INTRAVENOUS | Status: AC
Start: 2014-11-30 — End: 2014-11-30
  Administered 2014-11-30: 1000 mL via INTRAVENOUS

## 2014-11-30 MED ORDER — HYDROMORPHONE 1 MG/ML INJECTION SYRINGE
0.5000 mg | INJECTION | INTRAMUSCULAR | Status: DC | PRN
Start: 2014-11-30 — End: 2014-11-30
  Administered 2014-11-30: 0.5 mg via INTRAVENOUS
  Filled 2014-11-30: qty 1

## 2014-11-30 NOTE — ED Nursing Note (Signed)
Patient is now calm and sleeping comfortably. Pt in no distress noted at this time.

## 2014-11-30 NOTE — ED Nursing Note (Signed)
Report received and assumed pt care. Pt received aaox3 and in no distress noted at this time. Pt continues to c/o mid back pain. Pt now awaits for MRI to be done. Will continue to observe pt for any changes.

## 2014-11-30 NOTE — ED Nursing Note (Signed)
Patient discharged from ED with AVS, Rx, related instructions and all belongings. Patient is in NAD, is awake/alert skin, is pink/warm/dry. Pt leaves the ED ambulatory with self.

## 2014-11-30 NOTE — ED Initial Note (Signed)
EMERGENCY DEPARTMENT PHYSICIAN NOTE - GERSON FAUTH       Date of Service:   11/30/2014  3:03 PM Patient's PCP: No Pcp No Pcp   Note Started: 11/30/2014 15:09 DOB: [birth year]             Chief Complaint   Patient presents with    Incontinence       The history provided by the patient.  Interpreter used: No    Carl Mata is a 39yrold male, with a past medical history significant for T12 burst fracture s/p surgical fixture in 04/2013, who presents to the ED with a chief complaint of back pain after a fall, abnormal sensation in his bilateral lower extremities, episode of incontinence this AM. He tripped over a log 3 days ago when walking his dog and landed on his back. There was +LOC and he was unable to move after the fall. [facility] and discharged with Norco 10s that gives him very mild relief. He has since been unable to lift his R right due to back pain. He has had constant dizziness, with associated vomiting, visual disturbances, "pins and needles" in his legs, and an episode of incontinence this morning. He denies any abdominal pain, bowel incontinence, constipation, difficulty urinating, fevers, or chills.     A full history, including pertinent past medical and social history was reviewed.    HISTORY:  There are no hospital problems to display for this patient.   Allergies   Allergen Reactions    Nsaids (Non-Steroidal Anti-Inflammatory Drug) Anaphylaxis      Past Medical History:    Unspecified mental or behavioral problem                      Unspecified asthma(493.90)                                 Past Surgical History:    Fusion thoracic posterior                       N/A 04/04/2013    Laminectomy thoracic posterior                  N/A 04/04/2013   Social History    Marital Status: SINGLE              Spouse Name:                       Years of Education:                 Number of children:               Occupational History    None on file    Social History Main  Topics    Smoking Status: Current Every Day Smoker        Packs/Day: 0.50  Years: 15        Types: Cigarettes    Smokeless Status: Not on file                       Alcohol Use: No              Drug Use: Yes                Special: Methamphetamine  Comment: states he used meth >1 yr ago    Sexual Activity: Not on file          Other Topics            Concern    None on file    Social History Narrative    As of 04/07/2013:     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 Reportedly on parole in [place]. Used to work as a Theme park manager.    Lives in [place], currently "on the street," has a girlfriend but does not live with her. Doesn't know where he will live next. Says his GF's worker is looking into this.    H/o alcohol dependence (reports being sober for 1 year), + tobacco, + drug use (meth; reports last use >1 year ago).    ---------------------------     Review of patient's family history indicates:    No FH spine problems [Other] *                                Review of Systems   Constitutional: Negative for fever and chills.   HENT: Negative for congestion, rhinorrhea, sneezing and sore throat.    Eyes: Positive for visual disturbance.   Respiratory: Negative for cough and shortness of breath.    Cardiovascular: Negative for chest pain.   Gastrointestinal: Negative for abdominal pain and constipation.   Genitourinary: Negative for difficulty urinating.        +urinary incontinence   Musculoskeletal: Positive for back pain.   Skin: Negative for rash.   Neurological: Positive for dizziness and numbness.        +tingling   All other systems reviewed and are negative.      TRIAGE VITAL SIGNS:  Temp: 36.5 C (97.7 F) (11/30/14 1404)  Temp src: Oral (11/30/14 1404)  Pulse: 71 (11/30/14 1404)  BP: 125/80 mmHg (11/30/14 1404)  Resp: 18 (11/30/14 1404)  SpO2: 99 % (11/30/14 1404)  Weight: 69.3 kg (152 lb 12.5 oz) (11/30/14 1402)    Physical Exam   Constitutional: He is  oriented to person, place, and time. He appears well-developed and well-nourished.   HENT:   Head: Normocephalic and atraumatic.   Mouth/Throat: Oropharynx is clear and moist.   Eyes: Conjunctivae and EOM are normal. Pupils are equal, round, and reactive to light.   Neck: Normal range of motion. Neck supple.   No C spine TTP.   Cardiovascular: Normal rate, regular rhythm, normal heart sounds and intact distal pulses.  Exam reveals no gallop and no friction rub.    No murmur heard.  Pulmonary/Chest: Effort normal and breath sounds normal. No respiratory distress. He has no wheezes. He has no rales.   Abdominal: Soft. There is no tenderness.   Musculoskeletal: He exhibits tenderness. He exhibits no edema.   TTP at the mid-T to low-L spine, over prior surgical incision.   Neurological: He is alert and oriented to person, place, and time. No cranial nerve deficit.   Good rectal tone.  Sensation intact for all extremities.  Positive right straight leg raise, otherwise strength intact of all other extremities.    Moving all over bed in animated fashion without difficulty   Skin: Skin is warm and dry.   Psychiatric: He has a normal mood and affect. His behavior is normal.   Nursing note and vitals reviewed.  INITIAL ASSESSMENT & PLAN, MEDICAL DECISION MAKING, ED COURSE  Carl Mata is a 62yrmale who presents with a chief complaint of back pain after a fall, abnormal sensation in his bilateral lower extremities, episode of incontinence this AM.     Differential includes, but is not limited to: closed head injury, ICH, spinal fracture, dislocation, cord compression, doubt intraabdominal injury    The results of the ED evaluation were notable for the following:    Pertinent lab results:   Utox: +opiates, + amphetamine  UA: unremarkable  PO4: unremarkable  MG: unremarkable  APTT: unremarkable  INR: unremarkable  CRP: unremarkable  ESR: unremarkable  BMP: unremarkable  CBC: unremarkable    Pertinent imaging results  (reviewed and interpreted independently by me):   CT head without contrast interpreted by me revealed revealed no acute hemorrhage, mass effect, midline shift, or boney abnormalities.     MR L-SPINE WITHOUT CONTRAST  MR T-SPINE WITHOUT CONTRAST  pending    Radiology reads:   CT HEAD WITHOUT CONTRAST   NO ACUTE INTRACRANIAL FINDINGS.    MR L-SPINE WITHOUT CONTRAST  MR T-SPINE WITHOUT CONTRAST  pending    EKG (reviewed and interpreted independently by me): NSR, rate 68, normal axis, Qtc 463, no acute ST changes, unchanged from prior    Pertinent medications:   Dilaudid IV q1h prn, given for pain.  Zofran 4 mg IV, given for nausea.  Ativan 1 mg IV, given for anxiety.    Chart Review: I reviewed the patient's prior medical records. Pertinent information that is relevant to this encounter the patient has multiple ED visits where he ran out of his pain medicines and had similar complaints of urine incontinence and lower extremity paresthesias. He was told previously that he could not get pain medicine refills in the ED.     CURES database last 12 months: 4 prescriptions ([place]  Number of Records: 4 Start Date: 11/29/2013 End Date: 11/30/2014   Date Filled First Name Last Name DOB Address [facility] Dr.'s DEA# Dr.'s Name RX# Refill#  2014-11-13 Carl Mata [id number] HCL TAB 50 MG 20 [facility] [id number] Mo(MD) [phone]  2014-07-09 Carl Mata [id number]-[id number] [address]83151ACETAMINOPHEN HYDROCODONE BITARTRAT TAB 3[place] MG15 [facility]70 [id number] Hart(PA) [id number]  2014-04-16 Carl Mata [id number] [address]AM[place] HCL TAB 50 MG 10 [id number] [id number]  2014-01-20 Carl Mata [id number] NOTE BEFORE FILL,  [facility] [id number] HCL TAB 50 MG [place] [id number] Oats([place]) [id number]    Patient Summary Carl DYKEMAs a [age]yrd male presenting with worsening back pain after trauma and also with reports of gait instability and urinary incontinence. CT head obtained due to dizziness with no ICH. Symptoms improved with IV analgesia. MRI T/L spine pending for cord compression or nerve impingement. Signed out pending MRI read. If positive for cord injury or acute fracture will need spine consult. If negative discharge home with short course of analgesia and muscle relaxants and PCP follow up.     Clinical Summary:  Midline thoracic back pain  (primary encounter diagnosis)  Fall, initial encounter  Dizziness  Hx substance use  Disposition: pending MRI read    PRESENT ON ADMISSION:  Are any of the following four conditions present or suspected on admission: decubitus ulcer, infection from an intravascular device, infection due to an indwelling catheter, surgical site infection or pneumonia? No.    PATIENT'S GENERAL CONDITION:  Fair: Vital signs are stable and within normal limits. Patient is conscious but may be uncomfortable. Indicators are favorable.    SCRIBE STATEMENT  I, Muling Lin, SCRIBE,  am personally taking down the notes in the presence of Dr. Gaston Islam.  Electronically signed by - Amie Critchley, [place], Scribe  11/30/2014  15:09      SCRIBE DISCLAIMER   I, Villa Herb, MD, personally performed the services described in this documentation, as scribed by the trained medical scribe above in my presence, and it is both accurate and complete.     Electronically signed by: Villa Herb, MD, Resident      This patient was seen, evaluated, and care plan was developed with the resident.  I agree with the findings and plan as outlined in our combined note. I personally independently visualized the images and tracings as noted above.      Tenna Delaine, MD      Electronically signed by: Tenna Delaine, MD, Attending  Physician

## 2014-11-30 NOTE — ED Nursing Note (Signed)
Pt received in RR bed 2, arrived in [facility], states fell a few days ago, now having mid back pain, N/T to bilat legs from thigh to ankle. HA's with emesis. Reports waking up this am with urinary incontinence while sleeping and no void since that time. Monitor x3 placed. Awaiting MD eval

## 2014-11-30 NOTE — ED Progress Note (Signed)
Emergency Department Triage Note     Subjective: Carl Mata is a 39yr old male who presents to the ED with a chief complaint of back pain after a fall, abnormal sensation in his bilateral lower extremities, episode of incontinence this AM.     Patient's phone number confirmed - .     Objective (pertinent exam findings):    BP 125/80 mmHg  Pulse 71  Temp(Src) 36.5 C (97.7 F) (Oral)  Resp 18  Ht 1.727 m (5\' 8" )  Wt 69.3 kg (152 lb 12.5 oz)  BMI 23.24 kg/m2  SpO2 99%     General: No acute distress,.  Neuro: Abnormal - endorses decreased sensation to light touch in perianal area    Assessment: Carl Mata is a 38yr old male with history of prior spine surgery presenting with back pain, decreased sensation.      Plan: Initial studies to evaluate this problem will include labs and imaging. Further workup will then proceed in patient care area c/d. ?  Seen and initially evaluated by:  Estanislado Spire, MD, Attending Physician, Department of Emergency Medicine       This patient was seen by a physician in triage. Please see the ED Initial Note for full details of this patient's care. This note covers the brief initial evaluation performed to obtain initial studies to expedite the patient's care. It is not intended to be a comprehensive document of the patient's ED visit.    This patient was instructed that this preliminary assessment and any studies obtained do not constitute a full workup of the patient's chief complaint. The patient was instructed to wait in the assigned area after the triage evaluation to be reevaluated by a physician after initial studies are completed.

## 2014-11-30 NOTE — ED Triage Note (Addendum)
Pt tripped and fell over log 2-3 days ago.  Had CT done @ [facility] that noc, findings WNL.  Pt w/ "numbness below my waist".  Also getting HA's w/ emesis.  BLE pain, has "pins and needles" sensation to BLE.  Pt limping.  Pt c/o dizziness.  Pt also reports intermittent urinary incontinence.

## 2014-11-30 NOTE — ED Nursing Note (Signed)
Report to Andrew RN.

## 2014-11-30 NOTE — ED Nursing Note (Signed)
Assumed care of pt. Pt is awake, alert and oriented c/o middle back pain despite pain medication administration. Pt able to move all extremities without difficulty. Pt updated with plan of care and verbalizes understanding.

## 2014-11-30 NOTE — ED Nursing Note (Signed)
Patient is now waiting for MRI results. Pt is calm and resting comfortably on bed.

## 2014-11-30 NOTE — Discharge Instructions (Signed)
You had imaging today that was normal of your head. You could still feel dizzy or have headaches due to striking your head and could have a concussion.     Your back imaging showed no acute fracture or injury to the nerves. You should discuss physical therapy with your regular doctor. You can take a short course of medicines to help with your muscle spasm and back pain.     GENERAL PAIN MEDICATION PRECAUTIONS    You may have been prescribed medications for pain today. Some of these may contain a narcotic (Vicodin, Norco, Percocet, etc.) Take these pain medications as prescribed. You also may have been prescribed a muscle relaxant or anxiolytic (benzodiazepine) such as valium (diazepam) or ativan (lorazepam). Do not drink alcohol, drive, or operate heavy machinery while taking either narcotic or benzodiazepine medications. All narcotics and benzodiazepines have a risk of dependence, so please use with caution. If you were prescribed Vicodin, Norco, or Percocet, do not take additional products containing Tylenol (acetaminophen), as this can cause an acetaminophen overdose and can damage your liver. Do not take more than 4000mg  of acetaminophen daily. These can also make you constipated so consider taking a stool softener.    You may also have been prescribed an anti-inflammatory pain medication (NSAID) such as ibuprofen (Motrin, Advil) or naproxen (Aleve). Take the NSAID as prescribed, with food or milk.  Stop taking the NSAID if you develop abdominal pain, vomiting blood or dark/tarry or bloody stools. Be sure to drink plenty of fluids, at least 1-2 liters of water per day while taking an NSAID unless you have congestive heart failure or other condition that requires you to limit your water intake.

## 2014-11-30 NOTE — ED Nursing Note (Signed)
Pt resting quietly in gurney, no acute distress, states 8/10 back pain persists, VSS, GCS-15, unlabored respirations.

## 2014-11-30 NOTE — ED Nursing Note (Signed)
Pt making calls to friends via bedside phone. No acute distress, medicated for pain per MD order. MRI contacted, testing should be available in 2 hours. Pt states that he spoke to parole agent and has told her that ankle bracelet will need to removed for MRI.

## 2014-12-04 ENCOUNTER — Emergency Department
Admission: EM | Admit: 2014-12-04 | Discharge: 2014-12-04 | Discharge status: Against Medical Advice | Payer: MEDICAID | Attending: Emergency Medicine | Admitting: Emergency Medicine

## 2014-12-04 DIAGNOSIS — F1510 Other stimulant abuse, uncomplicated: Secondary | ICD-10-CM | POA: Diagnosis present

## 2014-12-04 DIAGNOSIS — R32 Unspecified urinary incontinence: Secondary | ICD-10-CM | POA: Insufficient documentation

## 2014-12-04 DIAGNOSIS — R51 Headache: Secondary | ICD-10-CM | POA: Insufficient documentation

## 2014-12-04 DIAGNOSIS — R200 Anesthesia of skin: Secondary | ICD-10-CM | POA: Insufficient documentation

## 2014-12-04 DIAGNOSIS — R42 Dizziness and giddiness: Secondary | ICD-10-CM | POA: Insufficient documentation

## 2014-12-04 DIAGNOSIS — Z9181 History of falling: Secondary | ICD-10-CM | POA: Insufficient documentation

## 2014-12-04 DIAGNOSIS — R112 Nausea with vomiting, unspecified: Secondary | ICD-10-CM | POA: Insufficient documentation

## 2014-12-04 LAB — CBC WITH DIFFERENTIAL
BASOPHILS % AUTO: 0.5 %
BASOPHILS ABS AUTO: 0 10*3/uL (ref 0–0.2)
EOSINOPHIL % AUTO: 2.6 %
EOSINOPHIL ABS AUTO: 0.2 10*3/uL (ref 0–0.5)
HEMATOCRIT: 47.7 % (ref 40–52)
HEMOGLOBIN: 16.5 g/dL (ref 14.0–18.0)
LYMPHOCYTE ABS AUTO: 2.1 10*3/uL (ref 1.0–4.8)
LYMPHOCYTES % AUTO: 25 %
MCH: 33.4 pg — AB (ref 27–33)
MCHC: 34.6 % (ref 32–36)
MCV: 96.6 UM3 (ref 80–100)
MONOCYTES % AUTO: 6.9 %
MONOCYTES ABS AUTO: 0.6 10*3/uL (ref 0.1–0.8)
MPV: 8.1 UM3 (ref 6.8–10.0)
NEUTROPHIL ABS AUTO: 5.4 10*3/uL (ref 1.80–7.70)
NEUTROPHILS % AUTO: 65 %
PLATELET COUNT: 290 10*3/uL (ref 130–400)
RDW: 13.9 U (ref 0–14.7)
RED CELL COUNT: 4.93 10*6/uL (ref 4.5–5.9)
WHITE BLOOD CELL COUNT: 8.3 10*3/uL (ref 4.5–11.0)

## 2014-12-04 LAB — BASIC METABOLIC PANEL
CALCIUM: 9.4 mg/dL (ref 8.6–10.5)
CARBON DIOXIDE TOTAL: 28 meq/L (ref 24–32)
CHLORIDE: 100 meq/L (ref 95–110)
CREATININE BLOOD: 0.98 mg/dL (ref 0.44–1.27)
GLUCOSE: 73 mg/dL (ref 70–99)
POTASSIUM: 3.7 meq/L (ref 3.3–5.0)
SODIUM: 139 meq/L (ref 135–145)
UREA NITROGEN, BLOOD (BUN): 12 mg/dL (ref 8–22)

## 2014-12-04 LAB — ELECTROCARDIOGRAM WITH RHYTHM STRIP: QTC: 407

## 2014-12-04 NOTE — ED Triage Note (Signed)
Pt presents with continued numbness and tingling to BLE, dizziness, HA's, vomiting, urinary incontinence. Pt reports unable to "get comfortable". Laying down helps with pain.   Was seen on 6/23 after trip and fall on 6/20. + LOC at incident. C/o n/t to BLE and urinary incontinence. Had head CT and L-T spine MRI and discharged on pain meds, out at this time. Hx of T12 burst fx in 2014. Unable to schedule follow up until 7/1.

## 2014-12-04 NOTE — ED Nursing Note (Signed)
Pt is verbalizing threatening behaviour towards MD. [employer] contacted.

## 2014-12-04 NOTE — ED Nursing Note (Signed)
Pt escorted to wr by [employer].

## 2014-12-04 NOTE — ED Nursing Note (Signed)
No Answer" note: The patient was called using the ED PA system. The patient was not found after a search of the ED Waiting Room Areas.

## 2014-12-04 NOTE — ED Progress Note (Signed)
Emergency Department Triage Note     Subjective: Carl Mata is a 39yr old male who presents to the ED with a chief complaint of persistent numbness/tingling to BLE, dizziness, HA, n/v, and urinary incontinence after recent fall.      Objective (pertinent exam findings):    BP 113/68 mmHg  Pulse 84  Temp(Src) 36.6 C (97.9 F) (Oral)  Resp 18  Ht 1.727 m (5\' 8" )  Wt 67.5 kg (148 lb 13 oz)  BMI 22.63 kg/m2  SpO2 98%     General: No acute distress, well appearing.  Neuro: normal gait    Assessment: Carl Mata is a 39yr old male with persistent numbness/tingling to BLE, dizziness, HA, n/v, and urinary incontinence after recent fall.     Several serious diagnoses could explain the patient's symptoms, however at this time the patient has stable vital signs, appears well and is in no acute distress indicating that the patient is not at risk for death or immediate deterioration.   It appears to be safe for the patient to await an available room in the emergency department, however my evaluation is incomplete and directed at starting the patient's workup.  The patient will be further evaluated and managed in the main emergency department.       Plan: Initial studies to evaluate this problem will include labs and oral analgesia as needed. Further workup will then proceed in patient care area c/d. ?    The patient will be brought back to a room when available for further evaluation.  Seen and initially evaluated by:  Claudina Lick, MD, Attending Physician, Department of Emergency Medicine       This patient was seen by a physician in triage. Please see the ED Initial Note for full details of this patient's care. This note covers the brief initial evaluation performed to obtain initial studies to expedite the patient's care. It is not intended to be a comprehensive document of the patient's ED visit.    This patient was instructed that this  preliminary assessment and any studies obtained do not constitute a full workup of the patient's chief complaint. The patient was instructed to wait in the assigned area after the triage evaluation to be reevaluated by a physician after initial studies are completed.

## 2014-12-04 NOTE — ED Progress Note (Signed)
Patient seen by myself in hallway. He states that he has ongoing pain and no pain meds. I explained to him that his MRI L-spine was negative on 6/23. He asked me what we were going to do for him and I explained that I was unsure given that I was the Physician in Triage and it would be up to the team in C/D Pod. Patient became very angry, said he had been waiting long enough and began to curse loudly in the hallway. I ask if he would not curse and respect the other patients who were sick and in the hallway near by and in a threatening tone he said "you better back off right now." He was subsequently ordered to leave the ED by [employer]. He ambulated out of the ED without difficulty.     N. Zenda Alpers, MD.

## 2015-02-09 ENCOUNTER — Emergency Department
Admission: EM | Admit: 2015-02-09 | Discharge: 2015-02-10 | Disposition: A | Discharge status: Home / Self Care | Payer: MEDICAID | Attending: Emergency Medicine | Admitting: Emergency Medicine

## 2015-02-09 ENCOUNTER — Encounter: Payer: Self-pay | Admitting: Emergency Medicine

## 2015-02-09 DIAGNOSIS — J45909 Unspecified asthma, uncomplicated: Secondary | ICD-10-CM | POA: Insufficient documentation

## 2015-02-09 DIAGNOSIS — F329 Major depressive disorder, single episode, unspecified: Secondary | ICD-10-CM

## 2015-02-09 DIAGNOSIS — F17210 Nicotine dependence, cigarettes, uncomplicated: Secondary | ICD-10-CM | POA: Insufficient documentation

## 2015-02-09 DIAGNOSIS — M549 Dorsalgia, unspecified: Secondary | ICD-10-CM | POA: Insufficient documentation

## 2015-02-09 DIAGNOSIS — F32A Depression, unspecified: Secondary | ICD-10-CM

## 2015-02-09 DIAGNOSIS — F1010 Alcohol abuse, uncomplicated: Principal | ICD-10-CM | POA: Insufficient documentation

## 2015-02-09 DIAGNOSIS — F39 Unspecified mood [affective] disorder: Secondary | ICD-10-CM | POA: Insufficient documentation

## 2015-02-09 DIAGNOSIS — F1510 Other stimulant abuse, uncomplicated: Secondary | ICD-10-CM | POA: Insufficient documentation

## 2015-02-09 LAB — CBC WITH DIFFERENTIAL
BASOPHILS % AUTO: 0.7 %
BASOPHILS ABS AUTO: 0.1 10*3/uL (ref 0–0.2)
EOSINOPHIL % AUTO: 0.9 %
EOSINOPHIL ABS AUTO: 0.1 10*3/uL (ref 0–0.5)
HEMATOCRIT: 46.4 % (ref 40–52)
HEMOGLOBIN: 15.8 g/dL (ref 14.0–18.0)
LYMPHOCYTE ABS AUTO: 2 10*3/uL (ref 1.0–4.8)
LYMPHOCYTES % AUTO: 21.9 %
MCH: 33.2 pg — AB (ref 27–33)
MCHC: 34 % (ref 32–36)
MCV: 97.6 UM3 (ref 80–100)
MONOCYTES % AUTO: 7 %
MONOCYTES ABS AUTO: 0.6 10*3/uL (ref 0.1–0.8)
MPV: 8.2 UM3 (ref 6.8–10.0)
NEUTROPHIL ABS AUTO: 6.5 10*3/uL (ref 1.80–7.70)
NEUTROPHILS % AUTO: 69.5 %
PLATELET COUNT: 280 10*3/uL (ref 130–400)
RDW: 13.3 U (ref 0–14.7)
RED CELL COUNT: 4.75 10*6/uL (ref 4.5–5.9)
WHITE BLOOD CELL COUNT: 9.3 10*3/uL (ref 4.5–11.0)

## 2015-02-09 LAB — BASIC METABOLIC PANEL
CALCIUM: 9.2 mg/dL (ref 8.6–10.5)
CARBON DIOXIDE TOTAL: 22 meq/L — AB (ref 24–32)
CHLORIDE: 107 meq/L (ref 95–110)
CREATININE BLOOD: 1.11 mg/dL (ref 0.44–1.27)
GLUCOSE: 88 mg/dL (ref 70–99)
POTASSIUM: 4 meq/L (ref 3.3–5.0)
SODIUM: 138 meq/L (ref 135–145)
UREA NITROGEN, BLOOD (BUN): 15 mg/dL (ref 8–22)

## 2015-02-09 LAB — HEPATIC FUNCTION PANEL
ALANINE TRANSFERASE (ALT): 29 U/L (ref 6–63)
ALBUMIN: 4.5 g/dL (ref 3.4–4.8)
ALKALINE PHOSPHATASE (ALP): 90 U/L (ref 35–115)
ASPARTATE TRANSAMINASE (AST): 35 U/L (ref 15–43)
BILIRUBIN DIRECT: 0.3 mg/dL — AB (ref 0.0–0.2)
BILIRUBIN TOTAL: 2.5 mg/dL — AB (ref 0.3–1.3)
PROTEIN: 7.3 g/dL (ref 6.3–8.3)

## 2015-02-09 LAB — ETHANOL, PLASMA: ETHANOL, PLASMA: 15 mg/dL — AB

## 2015-02-09 LAB — UR DRUGS OF ABUSE SCREEN
AMPHETAMINE SCREEN, URINE: POSITIVE ng/mL — AB
BARBITURATES SCREEN, URINE: NEGATIVE ng/mL
BENZODIAZEPINES SCREEN, URINE: NEGATIVE ng/mL
COCAINE METABOLITE SCRN, URINE: NEGATIVE ng/mL
OPIATES SCREEN, URINE: NEGATIVE ng/mL

## 2015-02-09 LAB — LIPASE: LIPASE: 16 U/L (ref 13–51)

## 2015-02-09 LAB — ACETAMINOPHEN

## 2015-02-09 LAB — SALICYLATE,BLOOD: SALICYLATE,BLOOD: NEGATIVE mg/dL

## 2015-02-09 NOTE — Allied Health Progress (Signed)
CLINICAL SOCIAL SERVICES   CRISIS SERVICES PSYCHIATRIC DIAGNOSTIC INTERVIEW  Note Date and Time: 02/09/2015    23:43  Date of Admission: 02/09/2015  8:37 PM    Date of Service: 02/09/15 Referred By: medical team   Patient Name: Carl Mata Ethnicity: cauc   DOB: 1975/07/28  Age: 39yr      Referral Source:Emergency Department   Reason for Referral: Psychiatric evaluation  Interpreter Assisting with Interview: no  Persons Interviewed: EMS, patient        HISTORY OF PRESENTING ILLNESS  This 39 yr old male presented to the ED stating that he wants to kill himself by jumping in front of a car or train.  He states that he tried to jump in front of a train "but they stopped, I told them to hit me."  Patient admits that he has been on a two day bender of meth use.  Patient states he is homeless and unemployed.  He did not have any answer on how he supports himself.    Patient admits to a history of psychiatric problems but could not say what his diagnosis is.  Patient did admit that he is on parole. Patient states "it was for underaged drinking." Looked up patient in the criminal website and he is a registered sex offender.  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  He has received OP mental health services through the [place].  He is not linked at this time.  PAST MEDICAL HISTORY  See medical eval  HISTORY OF SUBSTANCE ABUSE  History of alcohol abuse as well as methamphetamine abuse  PSYCHOSOCIAL HISTORY  Marital Status: Single  Living Situation: homeless  Patient resides with: self  Patient's Journalist, newspaper (e.g. 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  Family's / Caregiver's Perceptions Regarding Illness, Treatment, and Prognosis: unkn  MENTAL STATUS EXAM  Alert: yes  Oriented: Person, Place, Date and Time  Appearance: disheveled, malodorous  Memory: did not assess  Behaviour: calm  Attitude: cooperative  Eye Contact: fair  Affect: appropriate  Mood: appropriate-Patient says he is depressed and suicidal, however his mood is incongruent  Speech: appropriate  Ability to Communicate: good  Concentration: fair  Comprehension: fair  Insight: impaired  Judgement: impaired  Decision Making Capacity: fair  Impulse Control: impaired  Intellectual Functioning: normal  Thought Process: intact  Thought Content: hallucinations:  Did not endorse  Current Suicidal Ideation: yes Plan:jump in front of a train or car    Current Homicidal Ideation: Patient says that he will harm others that get in his way.    Plan:   Comments:     ASSESSMENT   This 39 yr old male presented to the ED stating that he has tried to jump in front of a train and cars today.  He also says that he is feeling like he will harm others that get in his way.  In interview, patient is smiling, pleasant, however continues to endorse thoughts of self harm and harm to others.  He has a criminal history (registered sex offender).  He may be malingering for shelter and food.  However due to his statements that he wants to harm himself and others, he will be placed on a hold as DTS and DTO.  Ability to cope with current situation: no  Ability to utilize resources: n/a  Willing to agree to plan for patient's safety: n/a  Adequate support system: no  Patient meets criteria for 51/50: yes: Danger to self and Danger to others  [place] Issue: no - no specific individual     DSM V  Diagnosis   Depressive Disorder;Methamphetamine abuse     PLAN/DISPOSITION  Consulted with medical team: yes  Notified: Family &/or legal representative(s): unable to locate or not applicable  Referrals Provided: Recommend Psychiatric Consult Liaison Service  Counseling Provided by [facility] Social Worker re: Substance abuse  Patient Disposition: Patient may be discharged when medically ready to:   Psychiatric Hospitalization ( involuntary):  tbd    Signature: Jethro Bastos, SW      Pager # [phone]  Licensed Clinical Social Worker

## 2015-02-09 NOTE — ED Nursing Note (Signed)
Pt reports that he just over it, doing anything he can to end his life. Pt reports that he is homeless and has been drinking a alcohol and using meth. Pt denies any IV drug use. Pt coming to the ER to seek help.
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
## 2015-02-09 NOTE — ED Nursing Note (Signed)
Pt provided with sandwich and juice

## 2015-02-09 NOTE — ED Nursing Note (Signed)
PES made aware of patient

## 2015-02-09 NOTE — ED Nursing Note (Signed)
MD at bedside. 

## 2015-02-10 ENCOUNTER — Encounter: Payer: Self-pay | Admitting: INTERNAL MEDICINE

## 2015-02-10 DIAGNOSIS — F39 Unspecified mood [affective] disorder: Secondary | ICD-10-CM | POA: Insufficient documentation

## 2015-02-10 DIAGNOSIS — Z765 Malingerer [conscious simulation]: Secondary | ICD-10-CM

## 2015-02-10 MED ORDER — LORAZEPAM 1 MG TABLET
1.0000 mg | ORAL_TABLET | Freq: Once | ORAL | Status: AC
Start: 2015-02-10 — End: 2015-02-10
  Administered 2015-02-10: 1 mg via ORAL
  Filled 2015-02-10: qty 1

## 2015-02-10 MED ORDER — HYDROCODONE 7.5 MG-ACETAMINOPHEN 325 MG/15 ML ORAL SOLUTION
10.0000 mL | Freq: Once | ORAL | Status: AC
Start: 2015-02-10 — End: 2015-02-10
  Administered 2015-02-10: 15 mL via ORAL
  Filled 2015-02-10: qty 15

## 2015-02-10 MED ORDER — HALOPERIDOL LACTATE 5 MG/ML INJECTION SOLUTION
5.0000 mg | Freq: Once | INTRAMUSCULAR | Status: DC
Start: 2015-02-10 — End: 2015-02-10

## 2015-02-10 MED ORDER — LORAZEPAM 2 MG/ML INJECTION SOLUTION
2.0000 mg | Freq: Once | INTRAMUSCULAR | Status: DC
Start: 2015-02-10 — End: 2015-02-10

## 2015-02-10 MED ORDER — DIPHENHYDRAMINE 50 MG/ML INJECTION SOLUTION
50.0000 mg | Freq: Once | INTRAMUSCULAR | Status: DC
Start: 2015-02-10 — End: 2015-02-10

## 2015-02-10 MED ORDER — MELATONIN 3 MG TABLET
3.0000 mg | ORAL_TABLET | Freq: Once | ORAL | Status: AC
Start: 2015-02-10 — End: 2015-02-10
  Administered 2015-02-10: 3 mg via ORAL
  Filled 2015-02-10: qty 1

## 2015-02-10 MED ORDER — TRAMADOL 50 MG TABLET
50.0000 mg | ORAL_TABLET | Freq: Once | ORAL | Status: AC
Start: 2015-02-10 — End: 2015-02-10
  Administered 2015-02-10: 50 mg via ORAL
  Filled 2015-02-10: qty 1

## 2015-02-10 NOTE — Discharge Instructions (Signed)
Continue home medications. Please seek psychiatry and substance abuse help.  Return to this emergency department for worsening or new symptoms.  See attached handouts for return precautions and home care.
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
## 2015-02-10 NOTE — ED Nursing Note (Signed)
AA&OX3. Respirations even non-labored. Ambulatory. Pt vu of dc instructions! No other complaints verbalized. NAD noted.

## 2015-02-10 NOTE — Case Management (ED) (Signed)
Emergency Room Clinical Case Management Psych Assess Note  Name: Carl Mata                MRN: [id number]  Date of Birth: March 22, 1976                   Age:31yr             Gender:male  Date: 02/10/2015                                                             Initial Note Time: 15:19                                                                                                                  Insurance: [facility]                                                      Date /Time of Admit: 02-09-15  2037  Date/Time of 5150: 02-10-15  0015  Date/Time of Medical Clearance: 02-10-15  0844  Medical Update if needed: none    Chief Complaint: DTS, DTO  Mental Status: a ox3  Pertinent History: amphetamine abuse, ETOH abuse, Mood DO, recent incarceration  Care Conference: yes  Living Situation: homeless  Contact Person: no one identified  Community Mental Health linkage: [facility], has ankle bracelet  RSO  Functional status: independent  Placement efforts: Confirmed active referral with [facility].   Discharge Plan: inpatient involuntary psychiatric treatment.  Following psych reassessment, patient no longer meets 5150 criteria. DC to community. Patient self arranged.    Barriers to Discharge: none        Loni Beckwith RN BS ACM   Clinical Case Manager  Phone: [phone]  Pager: [phone]

## 2015-02-10 NOTE — ED Progress Note (Signed)
EM progress note    I assumed care of this patient at 7:30a from Dr. Stephens Shire while the patient was undergoing evaluation for DTO and DS.  I evaluated the patient, discussed the case with the previous team, and reviewed the relevant studies.  For details of H&P, original assessment and plan, see original note for this visit.    09:22  -pt requesting to leave. Evaluated and interviewed by Psychiatry resident (Dr. Peggye Pitt) and 5150 hold lifted.  PES note reviewed.    Pt denies SI/HI. States he felt bad after using meth and not sleeping and not eating. Now feels better.    Final Diagnosis:   Methamphetamine abuse  Resolved SI and HI  Back pain    Disposition: discharge        Condition: good  Temp: 36.7 C (98.1 F) (09/03 0356)  Temp src: Oral (09/03 0356)  Pulse: 82 (09/03 0356)  BP: 145/76 (09/03 0356)  Resp: 18 (09/03 0356)  SpO2: 95 % (09/03 0356)  Height: 167.6 cm ( ) (09/02 2023)  Weight: 74.8 kg (164 lb 14.5 oz) (09/02 2023)        Report electronically signed by: Hal Hope, MD, MPH  Attending physician.

## 2015-02-10 NOTE — ED Nursing Note (Signed)
Pt very tense. Cursing and talking loud. States he wants someone to put him down. Security called to speak with pt. Pt just feels as though he can sign a piece of paper and walk out. It has been explained to pt that due to 5150 hold it can not be done that way. Pt does not agree and continues to disrespect staff with words.

## 2015-02-10 NOTE — Consults (Addendum)
PSYCHIATRY CONSULT  Note Date and Time: 02/10/2015    14:18  Date of Admission: 02/09/2015  8:37 PM    Date of Service: 02/10/2015 Name of Requesting Attending: Dr. Jimmie Molly        REASON FOR CONSULTATION:  SI    HISTORY OF PRESENT ILLNESS:   Carl Mata is a 39yr old male with history of methamphetamine abuse and mood disorder NOS who was BIB self last night reporting SI. Utox +meth.    Today, patient reports he came to the ED and said he had SI because did not feel well after not sleeping or eating while on methamphetamine. He says "I wanted a place to rest and was on drugs when I came in, but I am fine now. I can take care of myself. I'm Carl Mata. We get riled up and then we are ok." He denies any SI, intent, plan, HI, AH, or VH. He states that if he is discharged, he will go to his regular places on the street to get food and shelter. He reports he has enough clothing. He sees a therapist through the [facility] He denies any suicide attempts or history of taking any mental health medications. He denies access to guns. He reports pain is well-controlled right now and he does not have any needs. He does not want to quit methamphetamine at this time. He is medically cleared and requests to be discharged.     ROS: Negative except as stated in HPI.    Suicide Risk Assessment  1. Suicidal thoughts/behaviors :  Low, denies, prior unintentional OD but not attempts 2. Psychiatric diagnoses (including cognitive disorders): Moderate, substance use 3. Systemic illness: Moderate, pain 4. Psychosocial/childhood/genetic factors: Moderate, homeless 5. Specific psychological symptoms: Low, denies 6. Demographic features: Moderate, middle-aged male and 7. Overall Risk Assessment: Moderate chronic risk and low acute risk.     PAST PSYCHIATRIC HISTORY  Diagnosis: Methamphetamine Abuse, Mood disorder NOS  Inpatient: Hospitalized x1 a few years ago after OD  Outpatient: [facility]  Medications: none for mental health currently or  historically, took Tramadol for pain in the past. No current medications.  Suicide Attempts: Denies prior self-harm or suicide attempts. Unintentional OD on pain medication x1 a few years ago (states it was not an attempt on his life and that he took too many pills accidently because he was in pain and did not count the pills correctly).    SUBSTANCE ABUSE HISTORY  Tobacco: denies   Alcohol: denies  Drugs: methamphetamine (Utox +meth)    MEDICAL HISTORY  Patient Active Problem List    Diagnosis Date Noted    Mood disorder 02/10/2015    Methamphetamine abuse 12/04/2014     Overview Note:     Patient states last use use about 1.5 weeks ago in 11/2014.      Midline thoracic back pain 11/30/2014    Fall, initial encounter 11/30/2014    Dizziness 11/30/2014    Chronic back pain 07/28/2013    Back pain 05/27/2013    Episodic mood disorder 05/14/2013    Wound infection after surgery 04/22/2013    Stable burst fx of  T12 fracture 04/09/2013    Fall 12/07/2012     Overview Note:     C-spine cleared.       Elbow contusion 11/28/2012    Contusion 10/31/2012    Back sprain 10/31/2012    Cough 10/06/2012    Abdominal pain 09/24/2012    Right medial knee pain 09/20/2012  Allergies   Allergen Reactions    Nsaids (Non-Steroidal Anti-Inflammatory Drug) Anaphylaxis      Past Medical History   Diagnosis Date    Unspecified asthma(493.90)     Unspecified mental or behavioral problem      bipolar     Past Surgical History   Procedure Laterality Date    Fusion thoracic posterior N/A 04/04/2013     Procedure: FUSION THORACIC POSTERIOR;  Surgeon: Fernande Bras, MD;  [facility];  Service: Neurosurgery    Laminectomy thoracic posterior N/A 04/04/2013     Procedure: LAMINECTOMY THORACIC POSTERIOR;  Surgeon: Fernande Bras, MD;  [facility];  Service: Neurosurgery    No current facility-administered medications for this encounter.      Current Outpatient Prescriptions   Medication Sig    TRAMADOL HCL  (TRAMADOL PO)         There is no immunization history on file for this patient.    FAMILY PSYCHIATRIC HISTORY: Denies    SOCIAL HISTORY: Homeless but says he is well-established with plenty of resources.    OBJECTIVE  Medications:  Scheduled Medications  IV Medications  No current facility-administered medications for this encounter.   PRN Medications    Current Vitals  Weight: Weight: 74.8 kg (164 lb 14.5 oz) (02/09/15 2023)     Vitals:   Current  Minimum Maximum   BP BP: 145/76  BP: (116-145)/(67-76)    Temp Temp: 36.7 C (98.1 F)  Temp Min: 36.7 C (98.1 F)  Temp Max: 37 C (98.6 F)    Pulse Pulse: 82 Pulse Min: 82  Pulse Max: 106    Resp Resp: 18 Resp Min: 16  Resp Max: 18    O2 Sat SpO2: 95 % SpO2 Min: 95 % SpO2 Max: 98 %     24 Hour Intake/Output:       LAB TESTS/STUDIES: I personally reviewed the following.    CHEM 7   Lab Results   Lab Name Value Date/Time    NA 138 02/09/2015 10:33 PM    K 4.0 02/09/2015 10:33 PM    CL 107 02/09/2015 10:33 PM    CO2 22 (L) 02/09/2015 10:33 PM    CR 1.11 02/09/2015 10:33 PM    BUN 15 02/09/2015 10:33 PM     CBC   Lab Results   Lab Name Value Date/Time    WBC 9.3 02/09/2015 10:33 PM    HGB 15.8 02/09/2015 10:33 PM    HCT 46.4 02/09/2015 10:33 PM    PLT 280 02/09/2015 10:33 PM      Lab Results   Lab Name Value Date/Time    TP 7.3 02/09/2015 10:33 PM    ALB 4.5 02/09/2015 10:33 PM    TBIL 2.5 (H) 02/09/2015 10:33 PM    ALP 90 02/09/2015 10:33 PM    AST 35 02/09/2015 10:33 PM    ALT 29 02/09/2015 10:33 PM    BILID 0.3 (H) 02/09/2015 10:33 PM    Coagulation -   Lab Results   Lab Name Value Date/Time    INR 0.95 11/30/2014 02:37 PM    APTT 29.9 11/30/2014 02:37 PM    Toxicology -   Lab Results   Lab Name Value Date/Time    BARBSSCRNU Negative 02/09/2015 10:33 PM    BENZOSSCRNU Negative 02/09/2015 10:33 PM    COCAINESCRNU Negative 02/09/2015 10:33 PM    OPIATESSCRU Negative 02/09/2015 10:33 PM    AMPHETSCRNU PRESUMPTIVE POSITIVE (Abnl) 02/09/2015 10:33 PM  Blood Alcohol level  -   Lab Results   Lab Name Value Date/Time    ETOH 15 (H) 02/09/2015 10:33 PM    TSH - No results found for: TSH Thyroxine - No results found for: FT4 Ammonia - No results found for: AMM Creatine Kinase - No results found for: CK No results found for: B12, FOLATE, RPR, HIV1AND2SCR Valproic Acid level - No results found for: VALPROATE Lithium level - No results found for: LITHIUM    MENTAL STATUS EXAM:  Appearance: Weathered man with casually dressed in shorts/shirt with fair g&h, dirty socks, multiple tattoos.  Cognition: grossly intact  Behavior: Sitting on bed, charging parole ankle bracelet. Frustrated at first but remains cooperative.             Impulse Control: fair  Psychomotor: No PMA/PMR.  Speech: Loud volume, but regular rate and rhythm. Irritable prosody. Not pressured.  Eye Contact: Appropriate, but intense at times.  Mood: "Fine, wanting to leave."   Affect: Euthymic but irritable.  Thought Process: Linear, organized, logical.  Thought Content: Focused on wanting to leave. Able to state plan of self-care. Future-oriented. Denies SI, HI, AH, and VH.   Insight: fair   Judgement: fair    ASSESSMENT:  Carl Mata is a 39yr old male with history of methamphetamine abuse and mood disorder NOS who was BIB self last night reporting SI. Utox +meth. Today, patient says he reported SI for a place to rest and because he was on drugs. He denies SI, intent, or plan, HI, AH, or VH. He is future-oriented and is able to describe a plan of self-care. Chronic risk of suicide is increased by substance use, hx of pain, homelessness, and gender, but acute risk is low as patient denies any symptoms, denies SI, has no prior attempts, does not have a gun, has well-controlled pain, and has outpatient treatment. Patient is currently pre-contemplative about methamphetamine cessation, which would further reduce chronic suicide risk. He does not meet criteria for 5150 Hold and can be discharged.    Diagnosis: Malingering,  Methamphetamine Use Disorder    RECOMMENDATIONS:  -- Lift 5150 Hold.  -- Patient can be discharged to self.  -- No medications indicated.    Lyman Bishop, MD  [facility]  Psychiatry-Family Medicine, PGY-2  PI #: [phone]  Pager #: [phone]    This patient was seen, evaluated, and care plan was developed with the resident.  I agree with the assessment and plan as outlined in the resident's note.  Report electronically signed by Radonna Ricker, MD. Attending

## 2015-02-10 NOTE — Allied Health Progress (Signed)
CLINICAL SOCIAL SERVICES   CRISIS SERVICES PROGRESS NOTE  Note Date and Time: 02/10/2015    08:55  Date of Admission: 02/09/2015  8:37 PM    Patient Name: Carl Mata Referral Source: SW transfer    DOB: 06/27/1975  Age: 17yr     Brief Note:  Pt medically cleared, packets faxed to [facility] and [facility].      Signature: Freddi Che, LCSW       Pager # [phone]   Licensed Clinical Social Worker

## 2015-02-10 NOTE — ED Nursing Note (Signed)
Pt resting quietly in bed.

## 2015-02-10 NOTE — ED Nursing Note (Signed)
Pt will be discharged. Belongings given to pt at this time.

## 2015-02-10 NOTE — ED Nursing Note (Signed)
Lying in gurney,awake,alert,oriented X4,denies any pain or discomfort at this time,resp even and unlabored,per patient,".placed on 5 day hold",still having suicidal tendencies,will continue to monitor closely.

## 2015-02-10 NOTE — ED Nursing Note (Signed)
No acute events noted this shift.Calm and cooperative last night.Able to obtain sleep at long intervals.Continue on 5150 hold,awaiting for disposition.

## 2015-02-10 NOTE — ED Nursing Note (Signed)
Assumed patient care at 0205 hrs from [facility],received by gurney.

## 2015-02-10 NOTE — ED Nursing Note (Signed)
Received report from RN, assumed care. Pt awake. Walking around in room. States feeling better than yesterday and feel he shouldn't be here.

## 2015-02-10 NOTE — ED Nursing Note (Signed)
Dr made aware and at bedside.

## 2015-02-10 NOTE — ED Nursing Note (Signed)
Placed on 5150 hold,medically cleared,awaiting for possible Psych facility placement.

## 2015-02-10 NOTE — ED Nursing Note (Signed)
Pt transferred to A5.

## 2015-02-10 NOTE — ED Nursing Note (Signed)
Social worker at bedside talking with pt. Still loud in conversation.

## 2015-02-13 LAB — ELECTROCARDIOGRAM WITH RHYTHM STRIP: QTC: 438

## 2015-03-06 ENCOUNTER — Emergency Department
Admission: EM | Admit: 2015-03-06 | Discharge: 2015-03-06 | Discharge status: Against Medical Advice | Payer: MEDICAID | Attending: Emergency Medicine | Admitting: Emergency Medicine

## 2015-03-06 DIAGNOSIS — R45851 Suicidal ideations: Principal | ICD-10-CM | POA: Insufficient documentation

## 2015-03-06 LAB — CBC WITH DIFFERENTIAL
BASOPHILS % AUTO: 0.7 %
BASOPHILS ABS AUTO: 0.1 10*3/uL (ref 0–0.2)
EOSINOPHIL % AUTO: 1 %
EOSINOPHIL ABS AUTO: 0.1 10*3/uL (ref 0–0.5)
HEMATOCRIT: 46.7 % (ref 40–52)
HEMOGLOBIN: 15.6 g/dL (ref 14.0–18.0)
LYMPHOCYTE ABS AUTO: 2.4 10*3/uL (ref 1.0–4.8)
LYMPHOCYTES % AUTO: 33.9 %
MCH: 32.6 pg (ref 27–33)
MCHC: 33.5 % (ref 32–36)
MCV: 97.4 UM3 (ref 80–100)
MONOCYTES % AUTO: 4.7 %
MONOCYTES ABS AUTO: 0.3 10*3/uL (ref 0.1–0.8)
MPV: 8.6 UM3 (ref 6.8–10.0)
NEUTROPHIL ABS AUTO: 4.2 10*3/uL (ref 1.80–7.70)
NEUTROPHILS % AUTO: 59.7 %
PLATELET COUNT: 256 10*3/uL (ref 130–400)
RDW: 13.5 U (ref 0–14.7)
RED CELL COUNT: 4.79 10*6/uL (ref 4.5–5.9)
WHITE BLOOD CELL COUNT: 7.1 10*3/uL (ref 4.5–11.0)

## 2015-03-06 LAB — HEPATIC FUNCTION PANEL
ALANINE TRANSFERASE (ALT): 44 U/L (ref 6–63)
ALBUMIN: 4.3 g/dL (ref 3.4–4.8)
ALKALINE PHOSPHATASE (ALP): 68 U/L (ref 35–115)
ASPARTATE TRANSAMINASE (AST): 38 U/L (ref 15–43)
BILIRUBIN DIRECT: 0.1 mg/dL (ref 0.0–0.2)
BILIRUBIN TOTAL: 1 mg/dL (ref 0.3–1.3)
PROTEIN: 6.8 g/dL (ref 6.3–8.3)

## 2015-03-06 LAB — BASIC METABOLIC PANEL
CALCIUM: 9.6 mg/dL (ref 8.6–10.5)
CARBON DIOXIDE TOTAL: 23 meq/L — AB (ref 24–32)
CHLORIDE: 110 meq/L (ref 95–110)
CREATININE BLOOD: 0.84 mg/dL (ref 0.44–1.27)
GLUCOSE: 98 mg/dL (ref 70–99)
POTASSIUM: 4.1 meq/L (ref 3.3–5.0)
SODIUM: 142 meq/L (ref 135–145)
UREA NITROGEN, BLOOD (BUN): 11 mg/dL (ref 8–22)

## 2015-03-06 LAB — LIPASE: LIPASE: 21 U/L (ref 13–51)

## 2015-03-06 LAB — SALICYLATE,BLOOD: SALICYLATE,BLOOD: NEGATIVE mg/dL

## 2015-03-06 LAB — ETHANOL, PLASMA: ETHANOL, PLASMA: 149 mg/dL — AB

## 2015-03-06 LAB — ACETAMINOPHEN

## 2015-03-06 NOTE — ED Nursing Note (Signed)
No Answer" note: The patient was called using the ED PA system. The patient was not found after a search of the ED Waiting Room Areas.

## 2015-03-06 NOTE — Allied Health Consult (Signed)
Advised by Triage Rn that patient had walked out of the ED, denying suicidal ideation or plan, now that he was clinically and legally sober. He declined offer for assessment or resources by LCSW.

## 2015-03-06 NOTE — ED Nursing Note (Addendum)
Pt to triage desk.  Pt is alert and oriented x 4.  GCS 15.  Steady gait.  Pt inquiring about wait time.  Pt wants to leave.  Encouraged patient to stay.  Pt states he is homeless and hungry. PES notified that pt is wanting to leave.

## 2015-03-06 NOTE — ED Triage Note (Signed)
Pt walked to triage, slumped over the wheelchair.  Pt continuously ranting "I'll kill you fucking bastard! You fucking need to die!  I'm going to kill myself!"   Pt with strong smell of alcohol. Pt not commuting with [employer] fund[employer].    Fingerstick blood sugar - 99.

## 2015-03-06 NOTE — ED Nursing Note (Signed)
Pt calm and cooperative. Resting without complaint at this time.

## 2015-03-06 NOTE — ED Nursing Note (Signed)
Pt taken to hallway 5.

## 2015-03-06 NOTE — ED Progress Note (Signed)
Emergency Department Triage Note     Subjective: Carl Mata is a 39yr old male who presents to the ED with a chief complaint of suicidal ideation.      Objective (pertinent exam findings):    BP 113/80  Pulse 87  Temp 36.7 C (98.1 F) (Oral)  SpO2 97%     General: Smells of ETOH.  Psych: Labile mood, expresses SI.    Assessment: Carl Mata is a 39yr old male with suicidal ideation.      Plan: Initial studies to evaluate this problem will include labs. Further workup will then proceed in patient care area c/d. ?    Seen and initially evaluated by:  Jeralyn Bennett, MD, Attending Physician, Department of Emergency Medicine       This patient was seen by a physician in triage. Please see the ED Initial Note for full details of this patient's care. This note covers the brief initial evaluation performed to obtain initial studies to expedite the patient's care. It is not intended to be a comprehensive document of the patient's ED visit.    This patient was instructed that this preliminary assessment and any studies obtained do not constitute a full workup of the patient's chief complaint. The patient was instructed to wait in the assigned area after the triage evaluation to be reevaluated by a physician after initial studies are completed.

## 2015-03-06 NOTE — Allied Health Progress (Signed)
Received referral for psychiatric evaluation. Due to rn report of patient smelling strongly of alcohol, evaluation will commence once BAL is determined to be below 100.

## 2015-03-06 NOTE — ED Nursing Note (Signed)
Pt not in his gurney at this time.

## 2015-03-08 ENCOUNTER — Emergency Department
Admission: EM | Admit: 2015-03-08 | Discharge: 2015-03-09 | Disposition: A | Discharge status: Home / Self Care | Payer: MEDICAID | Attending: Emergency Medicine | Admitting: Emergency Medicine

## 2015-03-08 DIAGNOSIS — F39 Unspecified mood [affective] disorder: Principal | ICD-10-CM | POA: Insufficient documentation

## 2015-03-08 DIAGNOSIS — F1510 Other stimulant abuse, uncomplicated: Secondary | ICD-10-CM | POA: Insufficient documentation

## 2015-03-08 DIAGNOSIS — Z981 Arthrodesis status: Secondary | ICD-10-CM | POA: Insufficient documentation

## 2015-03-08 DIAGNOSIS — M549 Dorsalgia, unspecified: Secondary | ICD-10-CM | POA: Insufficient documentation

## 2015-03-08 DIAGNOSIS — G8929 Other chronic pain: Secondary | ICD-10-CM

## 2015-03-08 NOTE — ED Triage Note (Signed)
Pt c/o all over body pain, pt changes his story during triage. Pt states he is trying to hurt himself by walking out in traffic or taking drugs. Pt is unable to clearly answer questions if he has an active plan. Pt states he was released from [facility] this morning. Pt states he is not currently taking any medications and has never spoken to a doctor about his psych issues. PES notified.

## 2015-03-09 MED ORDER — HYDROCODONE 10 MG-ACETAMINOPHEN 325 MG TABLET
1.0000 | ORAL_TABLET | Freq: Once | ORAL | Status: AC
Start: 2015-03-09 — End: 2015-03-09
  Administered 2015-03-09: 1 via ORAL
  Filled 2015-03-09: qty 1

## 2015-03-09 NOTE — Discharge Instructions (Signed)
Thank you for choosing  [facility] for your emergency health care needs. It has been our privilege to take care of you today.  You have been treated for your symptoms and discharged home. Please take all medicines that are prescribed to you as directed.  It is crucial, if you have a primary care physician, to follow up with him or her in the time frame recommended as many health conditions that seem self-limited initially may actually worsen over time.  If you do not have a primary care physician, we will outline the various resources available for you to find one.     If at any time you feel that your condition is worsening, call your doctor or return to the emergency department for reevaluation.    Please realize that the results of some studies that you had done during your stay with [place] (such as xrays and cultures) are only preliminarily resulted.  Results of these studies may change as more information becomes available or as the studies are reevaluated by other members of our health care team in the next few days. We will attempt to contact you with any important changes or additions to the studies that were obtained today.     Please return to the ER if you experience any of the following:     difficulty controlling bladder/bowel function, if you have weakness in your leg, develop a fever, have worsening numbness, severe pain, or any urgent medical concerns.

## 2015-03-09 NOTE — ED Initial Note (Signed)
EMERGENCY DEPARTMENT PHYSICIAN NOTE - Carl Mata       Date of Service:   03/08/2015  8:20 PM Patient's PCP: No Pcp No Pcp   Note Started: 03/09/2015 00:59 DOB: 26-Sep-1975             Chief Complaint   Patient presents with    Assaultive or Suicidal Behavior       The history provided by the patient.  Interpreter used: No    Carl Mata is a 39yr old male, with a past medical history significant for T12 burst fracture status post T12 laminectomy, chronic back pain, and unspecified mood disorder, who presents to the ED with a chief complaint of back pain. Pain is chronic, but feels like it may have worsened a few days ago. No new trauma or inciting events. No fevers or bowel/bladder dysfunction. No IVDU. Daily user of methamphetamine.    Of note the triage chief complaint of suicidal ideations with plan differs greatly from what the patient expresses.    A full history, including pertinent past medical and social history was reviewed.    HISTORY:  There are no active hospital problems to display for this patient.   Allergies   Allergen Reactions    Nsaids (Non-Steroidal Anti-Inflammatory Drug) Anaphylaxis      Past Medical History:    Unspecified asthma(493.90)                                    Unspecified mental or behavioral problem                   Past Surgical History:    Fusion thoracic posterior                       N/A 04/04/2013    Laminectomy thoracic posterior                  N/A 04/04/2013   Social History    Marital status: MARRIED             Spouse name:                       Years of education:                 Number of children:               Occupational History    None on file    Social History Main Topics    Smoking status: Current Every Day Smoker                                                     Packs/day: 0.50      Years: 15.00          Types: Cigarettes       Smokeless status: Not on file                       Alcohol use: Yes                Comment: 4-5 beers a day    Drug use:  Yes  Special: Methamphetamine       Comment: active meth use    Sexual activity: Not on file          Other Topics            Concern    None on file    Social History Narrative    As of 04/07/2013:     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 Reportedly on parole in [place]. Used to work as a Designer, fashion/clothing.    Lives in [place], currently "on the street," has a girlfriend but does not live with her. Doesn't know where he will live next. Says his GF's worker is looking into this.    H/o alcohol dependence (reports being sober for 1 year), + tobacco, + drug use (meth; reports last use >1 year ago).    ---------------------------     Review of patient's family history indicates:    No FH spine problems [OTHER]   Other                        Review of Systems   Constitutional: Negative for fever and unexpected weight change.   HENT: Negative for congestion and sore throat.    Respiratory: Negative for cough, chest tightness and shortness of breath.    Cardiovascular: Negative for chest pain.   Gastrointestinal: Negative for abdominal pain, constipation, diarrhea, nausea and vomiting.   Genitourinary: Negative for difficulty urinating, dysuria, frequency and urgency.   Musculoskeletal: Positive for back pain.   Skin: Negative.    Neurological: Negative for dizziness, light-headedness and headaches.   Psychiatric/Behavioral: Positive for dysphoric mood. Negative for self-injury and suicidal ideas.   All other systems reviewed and are negative.      TRIAGE VITAL SIGNS:  Temp: 37.1 C (98.8 F) (03/08/15 2014)  Temp src: Oral (03/08/15 2014)  Pulse: 110 (03/08/15 2014)  BP: 115/77 (03/08/15 2014)  Resp: 18 (03/08/15 2014)  SpO2: 98 % (03/08/15 2014)  Weight: (not recorded)    Physical Exam   Constitutional: He is oriented to person, place, and time. He appears well-developed and well-nourished. No distress.   HENT:   Head: Normocephalic and atraumatic.   Mouth/Throat:  Oropharynx is clear and moist.   Eyes: EOM are normal. Pupils are equal, round, and reactive to light. Right eye exhibits no discharge. Left eye exhibits no discharge. No scleral icterus.   Neck: Normal range of motion. Neck supple. No JVD present. No tracheal deviation present.   Cardiovascular: Normal rate, regular rhythm, normal heart sounds and intact distal pulses.    Pulmonary/Chest: Effort normal and breath sounds normal. No respiratory distress.   Abdominal: Soft. There is no tenderness.   Musculoskeletal: Normal range of motion. He exhibits no edema.   Neurological: He is alert and oriented to person, place, and time. He has normal strength. He exhibits normal muscle tone. Coordination and gait normal. GCS eye subscore is 4. GCS verbal subscore is 5. GCS motor subscore is 6.   Reflex Scores:       Patellar reflexes are 2+ on the right side and 2+ on the left side.       Achilles reflexes are 2+ on the right side and 2+ on the left side.  Skin: Skin is warm and dry. He is not diaphoretic.   Psychiatric: He has a normal mood and affect. His speech is normal and behavior is normal. Thought content normal.  He expresses impulsivity. He expresses no homicidal and no suicidal ideation.   Nursing note and vitals reviewed.      INITIAL ASSESSMENT & PLAN, MEDICAL DECISION MAKING, ED COURSE  Carl Mata is a 39yr male who presents with a chief complaint of back pain.     Differential includes, but is not limited to: muscle spasm, disc protrusion, nerve root compression, cauda equina syndrome, epidural abscess, and malingering    The results of the ED evaluation were notable for the following:    Consults: A Consult was obtained from the PES service to evaluate for suicidal ideations. They recommend no 5150 criteria.      Chart Review: I reviewed the patient's prior medical records. Pertinent information that is relevant to this encounter: multiple ED evaluations with variable complaints during the same encounter  suggestive of malingering.    Patient Summary:   Carl Mata is a 39yr old male, with a past medical history significant for T12 burst fracture status post T12 laminectomy, chronic back pain, and unspecified mood disorder, now with worsening back pain.  Patient without red flags for back pain including but not limited to: age of onset less than 20 yrs or more than 15yrs, recent history of violent trauma, constant progressive, non mechanical pain (no relief with bed rest), thoracic pain , past medical history of malignant tumour, prolonged use of corticosteroids, IV drug abuse, immunosuppression, HIV, systematically unwell, unexplained weight loss, widespread neurological symptoms (including cauda equine syndrome), structural deformity, or fever. Normal gait. Pain controlled with oral medications. PES consulted regarding suicidal ideations and they recommend no 5150. No acute medical issues identified. Medically clear for disposition per our PES consultant.    SMART MEDICAL CLEARANCE    Medical clearance pathway for ED patients requiring clearance prior to transfer to inpatient psychiatric facilities with minimal lab testing.   Suspect New Onset Psychiatric Condition?  no  Medical Conditions that Require Screening? no  Diabetes (FSBS less than 60 or greater than 250) No data found.     Possibility of pregnancy (age 22-50)  Other complaints that require screening  Abnormal? no   Vital Signs? Temp: 36.6 C (97.9 F) (03/09/2015 12:56 AM)  Temp src: Oral (03/09/2015 12:56 AM)  Pulse: 73 (03/09/2015 12:56 AM)  BP: 109/66 (03/09/2015 12:56 AM)  Resp: 18 (03/09/2015 12:56 AM)  SpO2: 100 % (03/09/2015 12:56 AM)  Height: 1.676 m ( ) (02/09/2015  8:23 PM)  Weight: 74.8 kg (164 lb 14.5 oz) (02/09/2015  8:23 PM)   Temp:  greater than 38.0C (100.82F)   HR:  less than 50 or greater than 110   BP:  less than 100 systolic or greater than 180/110 (2 consecutive readings)   RR:  less than 8 or greater than 22   O2 Sat:  less than 95%  on room air  Mental Status Abnormal? no   Cannot answer name, month/year and location (minimum A/O x 3)  Physical Exam Abnormal? no  Risky Presentation? no   80yr (Age less than 12 or greater than 55 are exclusion from using SMART  Medical Clearance   Possibility of ingestion?   Eating disorders or not tolerating oral intake?   Potential for requiring medication to treat alcohol withdrawal (daily use for two weeks or more) ?   Ill-appearing, significant injury, prolonged struggle or "found down" ?  Therapeutic Levels Needed? no  ( some examples include Phenytoin, Valproic Acid, Lithium, Digoxin, Warfarin)    LAST VITAL SIGNS:  Temp:  36.6 C (97.9 F) (03/09/15 0056)  Temp src: Oral (03/09/15 0056)  Pulse: 73 (03/09/15 0056)  BP: 109/66 (03/09/15 0056)  Resp: 18 (03/09/15 0056)  SpO2: 100 % (03/09/15 0056)  Weight: (not recorded)    Clinical Impression: Depression. Methamphetamine abuse.    Disposition: Observation    PRESENT ON ADMISSION:  Are any of the following four conditions present or suspected on admission: decubitus ulcer, infection from an intravascular device, infection due to an indwelling catheter, surgical site infection or pneumonia? No.    PATIENT'S GENERAL CONDITION:  Fair: Vital signs are stable and within normal limits. Patient is conscious but may be uncomfortable. Indicators are favorable.    Electronically signed by: Dolores Lory, MD, Resident    Dolores Lory, MD  PGY1  [phone]  [facility]  Department of Emergency Medicine  Pager  [phone]    This patient was seen, evaluated, and care plan was developed with the resident.  I agree with the findings and plan as outlined in our combined note. I personally independently visualized the images and tracings as noted above.      Mosetta Putt, MD    Electronically signed by: Mosetta Putt, MD, Attending Physician

## 2015-03-09 NOTE — ED Nursing Note (Signed)
Patient discharged from ED with AVS, Rx, related instructions and all belongings. Patient is in NAD, is awake/alert skin, is pink/warm/dry. Pt leaves the ED ambulatory with self.

## 2015-03-09 NOTE — Allied Health Progress (Signed)
CLINICAL SOCIAL SERVICES   CRISIS SERVICES PROGRESS NOTE  Note Date and Time: 03/09/2015    03:09  Date of Admission: 03/08/2015  8:20 PM    Patient Name: Carl Mata Referral Source: MD   DOB: 02/12/1976  Age: 68yr     Pt does not meet 51/50 hold criteria, full evaluation to follow.       Signature: Dorena Dew, LCSW      Pager # [phone]  Licensed Clinical Social Worker

## 2015-03-09 NOTE — Allied Health Progress (Signed)
CLINICAL SOCIAL SERVICES   CRISIS SERVICES PSYCHIATRIC DIAGNOSTIC INTERVIEW  Note Date and Time: 03/09/2015    03:11  Date of Admission: 03/08/2015  8:20 PM    Date of Service: 03/09/2015 Referred By: MD   Patient Name: Carl Mata Ethnicity: White   DOB: [birth year]  Age: 39yr      Referral Source: Emergency Department Attending  Reason for Referral: Psychiatric evaluation  Interpreter Assisting with Interview: none, English speaking  Persons Interviewed: pt, AVATAR, Chart Review, medical team    Next of Kin:   Other contacts:     HISTORY OF PRESENTING ILLNESS  Pt is a 39 yo White male BIB [place] (6), according to RN report, upon arrival pt was not able to answer questions clearly, changed his story regarding why he was at the ED and complained of body pain all over.  Per RN report, pt also reported that he is suicidal with plans to walk into traffic, not on medication, was released from [facility] yesterday morning and that he has not spoken to a psychiatrist about his psych issues, this is when PES was requested. After pt received pain medication, food and some rest, pt told this writer that the whole thing was a misunderstanding.  Pt reports that since being homeless, he has to walk everywhere which may have caused his chronic injury to be exasperated at which time he asked a security gaurd in a grocery store to call EMS because he was in so much pain.  Pt reports that he told the triage RN that his pain was so bad that he felt like running into traffic.  "When I'm in pain, my mind goes and that pain was so bad, the only way I could think to explain it was that way."  Pt reports that his statements were taken out of context and he has no ideation, intent or plan for suicide.    PAST PSYCHIATRIC HISTORY(include family)  According to AVATAR, pt has a dx hx of Amphetamine Abuse, Mood D/O NOS, Psychotic Disorder NOS and ASPD.  Pt is not linked anywhere, but it was confirmed that pt did get discharged from [facility]  yesterday morning.  Pt also was just hospitalized at [facility] from 02/16/15-02/18/15 and also received some treatment while incarcerated, pt reports that he was EOP (highest level of care through [facility]) as well as CCCMS (lowest level of care) while incarcerated.  According to chart review, SW has been involved with pt twice this month already; on 9/27 PES was paged due to pt smelling strongly of alcohol but after pt metabolized, he left the ED denying SI and refusing referrals.  On 9/3 pt was held for DTS after being BIB EMS after attempting to walk in front of a train.  Pt denies taking any psychotropics because "some of those psych meds counteract with my ADHD and send it into over drive."  PAST MEDICAL HISTORY  Pt endorsed back surgery in 2014 after falling 9 feet while trying to place an [facility] unit at his sober living facility and reports that his reason for coming in today was due to aggravating his old injury somehow causing his pain to be a 8/10.  HISTORY OF SUBSTANCE ABUSE  Pt openly admits that he has a Methamphetamine addiction and that is why he is seeking a residential program through his [employer] fast streamer.  In 2014 according to pt, he was able to stay clean and sober for one year.  PSYCHOSOCIAL HISTORY  Marital Status: Single  Living Situation: homeless  Patient resides with: self   Patient's Journalist, newspaper (e.g. Conservator, payee, parent, etc.): none  Patient's Contact Info:     Family involved: pt reports that the bulk of his family is in [place]  Support system: pt's Civil Service fast streamer is supportive and pt is also linked with POC  Patient's education/occupation: disabled   Building services engineer: [place]  Insurance: Health and safety inspector Involved: POC     MENTAL STATUS EXAM  Alert: yes  Oriented: fully oriented  Appearance: pt's appearance is congruent to his chronological age, sitting up on gurney drinking apple juice wearing a hospital gown, missing teeth on the right side of his mouth, a sleeve of tattoos on his  left arm and a couple on his chest, long fingernails and short cut hair  Memory: Short-term Intact:  yes  Behavior: calm  Attitude: cooperative  Eye Contact: good  Affect: congruent with content of conversation  Mood: appropriate  Speech: appropriate  Ability to Communicate: good  Concentration: good  Comprehension: good  Insight: good  Judgment: good  Decision Making Capacity: good  Impulse Control: good  Intellectual Functioning: normal  Thought Process: intact  Thought Content: hallucinations:  denies  Current Suicidal Ideation: no Plan:    Current Homicidal Ideation: no  Plan:      ASSESSMENT  Pt is a 39 yo White male BIB EMS for full body pain, once here, pt reportedly told RN that he planned on running into traffic but after pain subsided reported that he was misunderstood and never had SI/HI VH/AH.  Pt made it very clear that he only came to the ED for his pain, reported that he only has six months left on parole and is looking forward to going back to [place] to be with his family who will send for him once he is off parole.  Pt appeared optimistic about his plan to call or go see his parole officer to check on the status of his residential treatment referral.  Pt also took referrals for community residential treatment facilities as a back up plan.  Pt verbalized an understanding of homeless resources i.e.; shelters, food, ect. And demonstrated the ability to utilize said resources.  Pt is able to care for himself, has a safety plan and does not meet 51/50 hold criteria today.  Ability to cope with current situation: intact   Ability to utilize resources: good  Willing to agree to plan for patient's safety: yes  Adequate support system: yes  Patient meets criteria for 51/50: no  [place] Issue: no     DSM-V Diagnosis  F39  Mood Disorder                PLAN/DISPOSITION  Pt will call or go to see his Civil Service fast streamer later this A.M. To inquire about a residential treatment facility she referred him to, if  the timeline isn't great, he will follow up on the substance abuse resource sheet given to him by this Clinical research associate.  Consulted with medical team: yes  Notified: pt is aware that he is not on a hold  Referrals Provided: Substance abuse counseling / treatment: residential treatment program  Counseling Provided by [facility] Social Worker re: Substance abuse  Patient Disposition: Patient may be discharged when medically ready to:   Home    Signature: Dorena Dew, [place]   Pager # [phone]  Licensed Clinical Social Worker

## 2015-03-09 NOTE — ED Nursing Note (Signed)
Assumed patient care, patient resting on gurney c/o aching sharp shooting pain from low back to neck pain 8/10 and all over pain. Calm and cooperative at this time. Denies any SI/HI. VSS will continue to monitor

## 2015-04-15 ENCOUNTER — Emergency Department
Admission: EM | Admit: 2015-04-15 | Discharge: 2015-04-16 | Payer: Federal, State, Local not specified - Other | Attending: Emergency Medicine | Admitting: Emergency Medicine

## 2015-04-15 ENCOUNTER — Emergency Department (EMERGENCY_DEPARTMENT_HOSPITAL): Payer: Federal, State, Local not specified - Other

## 2015-04-15 DIAGNOSIS — Z981 Arthrodesis status: Secondary | ICD-10-CM

## 2015-04-15 DIAGNOSIS — Y9389 Activity, other specified: Secondary | ICD-10-CM | POA: Insufficient documentation

## 2015-04-15 DIAGNOSIS — S3992XA Unspecified injury of lower back, initial encounter: Secondary | ICD-10-CM

## 2015-04-15 DIAGNOSIS — Y998 Other external cause status: Secondary | ICD-10-CM | POA: Insufficient documentation

## 2015-04-15 DIAGNOSIS — M62830 Muscle spasm of back: Principal | ICD-10-CM | POA: Insufficient documentation

## 2015-04-15 DIAGNOSIS — M545 Low back pain, unspecified: Secondary | ICD-10-CM | POA: Insufficient documentation

## 2015-04-15 DIAGNOSIS — W06XXXA Fall from bed, initial encounter: Secondary | ICD-10-CM | POA: Insufficient documentation

## 2015-04-15 DIAGNOSIS — M542 Cervicalgia: Secondary | ICD-10-CM

## 2015-04-15 DIAGNOSIS — S22081G Stable burst fracture of T11-T12 vertebra, subsequent encounter for fracture with delayed healing: Secondary | ICD-10-CM

## 2015-04-15 DIAGNOSIS — S0990XA Unspecified injury of head, initial encounter: Secondary | ICD-10-CM

## 2015-04-15 DIAGNOSIS — Y92143 Cell of prison as the place of occurrence of the external cause: Secondary | ICD-10-CM | POA: Insufficient documentation

## 2015-04-15 DIAGNOSIS — F99 Mental disorder, not otherwise specified: Secondary | ICD-10-CM | POA: Insufficient documentation

## 2015-04-15 DIAGNOSIS — S199XXA Unspecified injury of neck, initial encounter: Secondary | ICD-10-CM

## 2015-04-15 DIAGNOSIS — J45909 Unspecified asthma, uncomplicated: Secondary | ICD-10-CM | POA: Insufficient documentation

## 2015-04-15 DIAGNOSIS — R0789 Other chest pain: Secondary | ICD-10-CM

## 2015-04-15 LAB — BASIC METABOLIC PANEL
CALCIUM: 9.4 mg/dL (ref 8.6–10.5)
CARBON DIOXIDE TOTAL: 25 meq/L (ref 24–32)
CHLORIDE: 103 meq/L (ref 95–110)
CREATININE BLOOD: 0.94 mg/dL (ref 0.44–1.27)
GLUCOSE: 88 mg/dL (ref 70–99)
POTASSIUM: 4.3 meq/L (ref 3.3–5.0)
SODIUM: 141 meq/L (ref 135–145)
UREA NITROGEN, BLOOD (BUN): 11 mg/dL (ref 8–22)

## 2015-04-15 LAB — CBC WITH DIFFERENTIAL
BASOPHILS % AUTO: 0.8 %
BASOPHILS ABS AUTO: 0.1 10*3/uL (ref 0–0.2)
EOSINOPHIL % AUTO: 2.3 %
EOSINOPHIL ABS AUTO: 0.2 10*3/uL (ref 0–0.5)
HEMATOCRIT: 46.4 % (ref 40–52)
HEMOGLOBIN: 16.5 g/dL (ref 14.0–18.0)
LYMPHOCYTE ABS AUTO: 2.4 10*3/uL (ref 1.0–4.8)
LYMPHOCYTES % AUTO: 24.8 %
MCH: 33.9 pg — AB (ref 27–33)
MCHC: 35.5 % (ref 32–36)
MCV: 95.3 UM3 (ref 80–100)
MONOCYTES % AUTO: 8.4 %
MONOCYTES ABS AUTO: 0.8 10*3/uL (ref 0.1–0.8)
MPV: 8.8 UM3 (ref 6.8–10.0)
NEUTROPHIL ABS AUTO: 6.1 10*3/uL (ref 1.80–7.70)
NEUTROPHILS % AUTO: 63.7 %
PLATELET COUNT: 281 10*3/uL (ref 130–400)
RDW: 13.4 U (ref 0–14.7)
RED CELL COUNT: 4.86 10*6/uL (ref 4.5–5.9)
WHITE BLOOD CELL COUNT: 9.5 10*3/uL (ref 4.5–11.0)

## 2015-04-15 LAB — HEPATIC FUNCTION PANEL
ALANINE TRANSFERASE (ALT): 18 U/L (ref 6–63)
ALBUMIN: 4 g/dL (ref 3.4–4.8)
ALKALINE PHOSPHATASE (ALP): 69 U/L (ref 35–115)
ASPARTATE TRANSAMINASE (AST): 23 U/L (ref 15–43)
BILIRUBIN DIRECT: 0.2 mg/dL (ref 0.0–0.2)
BILIRUBIN TOTAL: 1.2 mg/dL (ref 0.3–1.3)
PROTEIN: 6.7 g/dL (ref 6.3–8.3)

## 2015-04-15 LAB — UR DRUGS OF ABUSE SCREEN
AMPHETAMINE SCREEN, URINE: NEGATIVE ng/mL
BARBITURATES SCREEN, URINE: NEGATIVE ng/mL
BENZODIAZEPINES SCREEN, URINE: NEGATIVE ng/mL
COCAINE METABOLITE SCRN, URINE: NEGATIVE ng/mL
OPIATES SCREEN, URINE: NEGATIVE ng/mL

## 2015-04-15 LAB — URINALYSIS-COMPLETE
BILIRUBIN URINE: NEGATIVE
GLUCOSE URINE: NEGATIVE mg/dL
KETONES: NEGATIVE mg/dL
LEUK. ESTERASE: NEGATIVE
NITRITE URINE: NEGATIVE
OCCULT BLOOD URINE: NEGATIVE mg/dL
PH URINE: 7 (ref 4.8–7.8)
PROTEIN URINE: NEGATIVE mg/dL
SPECIFIC GRAVITY: 1.021 (ref 1.002–1.030)
UROBILINOGEN.: 2 mg/dL (ref ?–2.0)

## 2015-04-15 LAB — LIPASE: LIPASE: 28 U/L (ref 13–51)

## 2015-04-15 MED ORDER — FENTANYL (PF) 50 MCG/ML INJECTION SOLUTION
50.0000 ug | INTRAMUSCULAR | Status: DC | PRN
Start: 2015-04-15 — End: 2015-04-16
  Administered 2015-04-15 (×4): 100 ug via INTRAVENOUS
  Filled 2015-04-15 (×5): qty 2

## 2015-04-15 MED ORDER — MIDAZOLAM (PF) 1 MG/ML INJECTION SOLUTION
1.0000 mg | Freq: Once | INTRAMUSCULAR | Status: AC
Start: 2015-04-15 — End: 2015-04-15
  Administered 2015-04-15: 1 mg via INTRAVENOUS
  Filled 2015-04-15: qty 2

## 2015-04-15 MED ORDER — FENTANYL (PF) 50 MCG/ML INJECTION SOLUTION
50.0000 ug | Freq: Once | INTRAMUSCULAR | Status: AC
Start: 2015-04-15 — End: 2015-04-15
  Administered 2015-04-15: 50 ug via INTRAVENOUS

## 2015-04-15 MED ORDER — ONDANSETRON HCL (PF) 4 MG/2 ML INJECTION SOLUTION
4.0000 mg | Freq: Once | INTRAMUSCULAR | Status: AC
Start: 2015-04-15 — End: 2015-04-15
  Administered 2015-04-15: 4 mg via INTRAVENOUS
  Filled 2015-04-15: qty 2

## 2015-04-15 NOTE — ED Triage Note (Signed)
[facility]. Larey SeatFell out of 5 ft bunk trying t5o climb out. -loc. Pt c/o back pain.  Has hx of back surgery for degenerative disc. +csm's. Pt denies abd pain, denies chest pain. Arrived in full spinal precautions.

## 2015-04-15 NOTE — ED Nursing Note (Signed)
Pt BIBA s/p fall off of 7 ft bunk bed. States was getting off of bunk when feet got tangled in blanket. Fell with back onto stationary table. -LOC. Small abrasion L eye. Pt c/o numbness and tingling to LLE. Not able to wiggle foot but able to wiggle great toe and lift leg off of gurney. Pt with hx of back surg in 2014. Pt also states he was incontinent of urine.

## 2015-04-15 NOTE — ED Nursing Note (Signed)
[facility] Pt back from CT and on monitor.  Pt in c-collar and in spinal precautions.  Pt states he is having severe back pain that he rates 9/10. PT able to move lower extremities and sensation is intact.

## 2015-04-15 NOTE — ED Nursing Note (Signed)
Received report from Trinna PostAlex, Charity fundraiserN. asssumed care of patient.

## 2015-04-15 NOTE — ED Nursing Note (Addendum)
Patient awake and alert, C Collar still in place, complaining of lower back fiery pain prior to fentanyl admin. Patient GCS 15, denies n/t throughout, no CP, no resp distress. CMS intact throughout. Prison guards at bedside.

## 2015-04-15 NOTE — ED Initial Note (Signed)
EMERGENCY DEPARTMENT PHYSICIAN NOTE - Carl Mata       Date of Service:   04/15/2015  4:57 PM Patient's PCP: No Pcp No Pcp   Note Started: 04/15/2015 17:00 DOB: [birth year]             Chief Complaint   Patient presents with    Back Pain Acute     Code: 933 Trauma Activation    The history provided by the patient.  Interpreter used: No    Carl Mata is a 39yr old male BIBA, with a past medical history significant for prior spinal fusions of tailbone and hardware placement, unspecified mental disorder, who presents to the ED with a chief complaint of back pain that began this afternoon. Patient reports he was trying to climb out of his bunk bed 5 feet above the ground when he fell, landing on his back onto a table. Denies hitting his head or LOC, saddle anesthesia, but endorses numbness and tingling to left leg. Patient did not get up afterward and endorses bladder incontinence a few moments after the fall. Currently describes back pain as aching, located to entire back, 10/10 in quality, constant and unchanged since onset, worsened with direct pressure and improved by nothing.   Endorses associated pain to his chest worsened with inspiration.     A full history, including pertinent past medical and social history was reviewed.    HISTORY:  There are no active hospital problems to display for this patient.   Allergies   Allergen Reactions    Nsaids (Non-Steroidal Anti-Inflammatory Drug) Anaphylaxis      Past Medical History:    Unspecified asthma(493.90)                                    Unspecified mental or behavioral problem                   Past Surgical History:    Fusion thoracic posterior                       N/A 04/04/2013    Laminectomy thoracic posterior                  N/A 04/04/2013   Social History    Marital status: MARRIED             Spouse name:                       Years of education:                 Number of children:               Occupational History    None on file    Social  History Main Topics    Smoking status: Current Every Day Smoker                                                     Packs/day: 0.50      Years: 15.00          Types: Cigarettes       Smokeless status: Not on file  Alcohol use: Yes                Comment: 4-5 beers a day    Drug use: Yes                Special: Methamphetamine       Comment: active meth use    Sexual activity: Not on file          Other Topics            Concern    None on file    Social History Narrative    As of 04/07/2013:     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 Reportedly on parole in [place]. Used to work as a Designer, fashion/clothingroofer.    [place] in [place], currently "on the street," has a girlfriend but does not live with her. Doesn't know where he will live next. Says his GF's worker is looking into this.    H/o alcohol dependence (reports being sober for 1 year), + tobacco, + drug use (meth; reports last use >1 year ago).    ---------------------------     Review of patient's family history indicates:    No FH spine problems [OTHER]   Other                          Review of Systems   Constitutional: Negative for chills and fever.   HENT: Negative for congestion, sore throat and voice change.    Eyes: Negative for photophobia and visual disturbance.   Respiratory: Negative for cough and shortness of breath.    Gastrointestinal: Negative for abdominal pain, diarrhea, nausea and vomiting.        No bowel incontinence   Genitourinary: Negative for dysuria and flank pain.        +Bladder incontinence   Musculoskeletal: Positive for back pain. Negative for joint swelling and neck stiffness.   Skin: Negative for rash.   Neurological: Positive for numbness. Negative for weakness and headaches.   All other systems reviewed and are negative.      TRIAGE VITAL SIGNS:  Temp: 37.1 C (98.8 F) (04/15/15 1656)  Temp src: Oral (04/15/15 1656)  Pulse: 78 (04/15/15 1656)  BP: 113/79 (04/15/15  1656)  Resp: 18 (04/15/15 1656)  SpO2: 99 % (04/15/15 1656)  Weight: (not recorded)      Physical Exam   Constitutional: He is oriented to person, place, and time. He appears well-developed and well-nourished. No distress. Cervical collar and backboard in place.   Patient appears uncomfortable, writhing in pain with rapid breaths, but not in respiratory distress.   HENT:   Head: Normocephalic and atraumatic.   Right Ear: External ear normal.   Left Ear: External ear normal.   Mouth/Throat: Oropharynx is clear and moist. No oropharyngeal exudate.   No cephalohematomas.  Abrasion just under the left eye.  No hemotympanum bilaterally.  No signs of intraoral trauma.  No malocclusion.   Eyes: Conjunctivae and EOM are normal. Pupils are equal, round, and reactive to light.   Pupils 3 mm bilaterally and equally reactive to light.   Neck: Normal range of motion. Neck supple.   C spine: No midline tenderness to palpation. No abrasions, step offs, or gross signs of trauma.   Unable to assess ROM d/t cervical collar placement.     Cardiovascular: Normal rate, regular rhythm and normal heart sounds.  Exam reveals no gallop  and no friction rub.    No murmur heard.  Pulses:       Radial pulses are 2+ on the right side, and 2+ on the left side.        Femoral pulses are 2+ on the right side, and 2+ on the left side.       Dorsalis pedis pulses are 2+ on the right side, and 2+ on the left side.        Posterior tibial pulses are 2+ on the right side, and 2+ on the left side.   Pulmonary/Chest: Effort normal and breath sounds normal. No respiratory distress. He has no wheezes. He has no rales. He exhibits tenderness.   No chest wall crepitus, but endorses tenderness to palpation.   Abdominal: Soft. Bowel sounds are normal. He exhibits no distension. There is no tenderness. There is no rebound and no guarding.   Genitourinary:   Genitourinary Comments: Good rectal tone, no gross blood.   Musculoskeletal: Normal range of motion. He  exhibits tenderness.   C spine: +Midline tenderness to palpation. No abrasions, step offs, or gross signs of trauma.   T spine: +Midline tenderness to palpation. No abrasions, step offs, or gross signs of trauma.  L spine: +Midline tenderness to palpation. No abrasions, step offs, or gross signs of trauma.  Pelvis is stable and non-tender to palpation.    RUE: No deformities, abrasions, or tenderness to palpation. No gross signs of trauma.  LUE: No deformities, abrasions, or tenderness to palpation. No gross signs of trauma.  RLE: No deformities, abrasions, or tenderness to palpation. No gross signs of trauma. FROM.  LLE: No deformities, abrasions, or tenderness to palpation. No gross signs of trauma. FROM.     Neurological: He is alert and oriented to person, place, and time. No cranial nerve deficit.   RLE: Intact sensation. Full ROM of toes.  LLE: Decreased sensation ranging from thigh to toes. Unable to wiggle toes.    Skin: Skin is warm and dry. No rash noted. He is not diaphoretic.   Psychiatric: He has a normal mood and affect. His behavior is normal.   Nursing note and vitals reviewed.      INITIAL ASSESSMENT & PLAN, MEDICAL DECISION MAKING, ED COURSE  LENOX BINK is a 24yr male who presents with a chief complaint of back pain.     Differential includes, but is not limited to: spinal fracture, dislocation, hardware malfunction, cauda equina, C spine injury, intracranial hemorrhage.     The results of the ED evaluation were notable for the following:    Pertinent lab results:   CBC unremarkable  BMP unremarkable  LFT unremarkable   Lipase within normal limits  UA unremarkable with no signs of infection or blood  Utox negative    Pertinent imaging results (reviewed and interpreted independently by me):   Radiology reads:   CHEST 1 VIEW  No acute cardiopulmonary findings.     T-SPINE 2 VIEWS  Unchanged T12 burst fracture and posterior spine fusion hardware from T10  through L2. No evidence of acute fracture  or traumatic spondylolisthesis.    L-SPINE 2 OR 3 VIEWS  Unchanged T10-L2 posterior spine fusion without evidence of complication.  Unchanged chronic T12 burst fracture. No evidence of acute fracture or  traumatic spondylolysis.    CT HEAD WITHOUT CONTRAST  No acute intracranial process.     CT C-SPINE WITHOUT CONTRAST  No acute traumatic cervical spine injury.    MR T-SPINE WITHOUT  CONTRAST  MR L-SPINE WITHOUT CONTRAST    Pertinent medications:   Fentanyl 50 mcg IV  Fentanyl 100 mcg IV x2  Versed 1 mg IV  Zofran 4 mg IV      Chart Review: I reviewed the patient's prior medical records. Pertinent information that is relevant to this encounter recent visit for chronic back pain; prior dx meth use.      Patient Summary: Carl Mata is a 39yr old male BIBA, with a past medical history significant for prior spinal fusions of tailbone and hardware placement, unspecified mental disorder, who presents to the ED with a chief complaint of back pain. On physical examination, patient has C/T/L midline tenderness to palpation with decreased sensation of his left leg.  Obtained CT head/neck for midline c spine ttp and mechanism which showed no acute process. Plain films of T/L spine revealed prior T12 burst fracture and spine fusion, but no acute fractures. Lab work was unremarkable. His symptoms were treated with Versed, Fentanyl, and Zofran. Given patient's new neurologic deficit (RLE loss of sensation) we obtained MRI T/L which was unremarkable       LAST VITAL SIGNS:  Temp: 37.1 C (98.8 F) (04/15/15 1656)  Temp src: Oral (04/15/15 1656)  Pulse: 78 (04/15/15 1656)  BP: 113/79 (04/15/15 1656)  Resp: 18 (04/15/15 1656)  SpO2: 99 % (04/15/15 1656)  Weight: (not recorded)      Clinical Impression: back spasm      Disposition: Discharge. Follow up with pmd. ED discharge instructions were reviewed and provided. It was discussed that radiology reports are preliminary and the patient will be contacted for any changes in  interpretation.      PRESENT ON ADMISSION:  Are any of the following four conditions present or suspected on admission: decubitus ulcer, infection from an intravascular device, infection due to an indwelling catheter, surgical site infection or pneumonia? No.    PATIENT'S GENERAL CONDITION:  Good: Vital signs are stable and within normal limits. Patient is conscious and comfortable. Indicators are excellent.     SCRIBE STATEMENT  I, Suzie Portela, SCRIBE,  am personally taking down the notes in the presence of Dr. Fara Olden, MD.  Electronically signed by - Suzie Portela, SCRIBE, Scribe  04/15/2015  17:00      SCRIBE DISCLAIMER   I, Candelaria Stagers, MD, personally performed the services described in this documentation, as scribed by the trained medical scribe above in my presence, and it is both accurate and complete.     Electronically signed by: Candelaria Stagers, MD, Resident      This patient was seen, evaluated, and care plan was developed with the resident.  I agree with the findings and plan as outlined in our combined note. I personally independently visualized the images and tracings as noted above.      Robyn Haber, MD      Electronically signed by: Robyn Haber, MD, Attending Physician

## 2015-04-15 NOTE — ED Nursing Note (Signed)
Pt taken to MRI on gurney

## 2015-04-15 NOTE — Consults (Signed)
933 trauma code activation     Patient seen and evaluated by the trauma service within one hour of presentation. We will follow the patient's ED course.  When appropriate, we will be ready to admit the patient.  If the patient requires admission to a service other than Trauma or Acute Care Surgery, please page [phone] for clearance by a Trauma attending.  Thank you.       Dolores LoryAmar Rayane Gallardo, MD  [phone]  04/15/2015  17:11

## 2015-04-15 NOTE — ED Nursing Note (Signed)
Report to Alex, RN.

## 2015-04-16 DIAGNOSIS — M545 Low back pain, unspecified: Secondary | ICD-10-CM | POA: Insufficient documentation

## 2015-04-16 MED ORDER — HYDROCODONE 5 MG-ACETAMINOPHEN 325 MG TABLET
1.0000 | ORAL_TABLET | Freq: Once | ORAL | Status: AC
Start: 2015-04-16 — End: 2015-04-16
  Administered 2015-04-16: 2 via ORAL
  Filled 2015-04-16: qty 2

## 2015-04-16 NOTE — Discharge Instructions (Signed)
Thank you for choosing [facility] for your emergency health care needs. It has been our privilege to take care of you today. Your primary complaints have been evaluated based on your history, lab tests, imaging tests and you have been treated for your symptoms and discharged home. Please take all medicines that are prescribed to you as directed (see below).  It is crucial, if you have a primary care physician, to follow up with him or her in the time frame recommended as many health conditions that seem self-limited initially may actually worsen over time.  If you do not have a primary care physician, we will outline the various resources available for you to find one.    If at any time you feel that your condition is worsening, call your doctor or return to the Emergency Department for reevaluation. CALL 911 IF YOU THINK YOU ARE HAVING A MEDICAL EMERGENCY. Return to the Emergency Department if you are unable to obtain the recommended follow-up treatment or you are not better as expected. You can call the Medical Center with questions at [phone].    Please realize that the results of some studies that you had done during your stay with us (such as x-rays and cultures) have only preliminarily results at this time.  Results of these studies may change as more information becomes available or as the studies are re-evaluated by other members of our health care team in the next few days. We will attempt to contact you with any important changes or additions to the studies that were obtained today, particularly if any of these results require a change in your treatment.

## 2015-04-16 NOTE — ED Nursing Note (Signed)
Patient discharged from ED with AVS, Rx, related instructions and all belongings. Patient is in NAD, is awake/alert skin, is pink/warm/dry. Pt leaves the ED ambulatory with law enforcement.

## 2015-04-16 NOTE — ED Nursing Note (Signed)
Informed MD dougherty regarding patients guards requesting to leave, MD to be at bedside shortly to discharge patient.

## 2015-12-01 ENCOUNTER — Emergency Department (EMERGENCY_DEPARTMENT_HOSPITAL): Payer: MEDICAID

## 2015-12-01 ENCOUNTER — Emergency Department
Admission: EM | Admit: 2015-12-01 | Discharge: 2015-12-02 | Disposition: A | Discharge status: Home / Self Care | Payer: MEDICAID | Attending: Emergency Medicine | Admitting: Emergency Medicine

## 2015-12-01 DIAGNOSIS — M545 Low back pain, unspecified: Secondary | ICD-10-CM | POA: Insufficient documentation

## 2015-12-01 DIAGNOSIS — G894 Chronic pain syndrome: Secondary | ICD-10-CM

## 2015-12-01 DIAGNOSIS — W1839XA Other fall on same level, initial encounter: Secondary | ICD-10-CM | POA: Insufficient documentation

## 2015-12-01 DIAGNOSIS — K639 Disease of intestine, unspecified: Secondary | ICD-10-CM

## 2015-12-01 DIAGNOSIS — R29898 Other symptoms and signs involving the musculoskeletal system: Secondary | ICD-10-CM | POA: Insufficient documentation

## 2015-12-01 DIAGNOSIS — M542 Cervicalgia: Secondary | ICD-10-CM

## 2015-12-01 DIAGNOSIS — R55 Syncope and collapse: Secondary | ICD-10-CM

## 2015-12-01 DIAGNOSIS — F319 Bipolar disorder, unspecified: Secondary | ICD-10-CM | POA: Insufficient documentation

## 2015-12-01 DIAGNOSIS — F17210 Nicotine dependence, cigarettes, uncomplicated: Secondary | ICD-10-CM | POA: Insufficient documentation

## 2015-12-01 DIAGNOSIS — R935 Abnormal findings on diagnostic imaging of other abdominal regions, including retroperitoneum: Secondary | ICD-10-CM

## 2015-12-01 DIAGNOSIS — M549 Dorsalgia, unspecified: Secondary | ICD-10-CM

## 2015-12-01 DIAGNOSIS — R42 Dizziness and giddiness: Secondary | ICD-10-CM

## 2015-12-01 DIAGNOSIS — G8911 Acute pain due to trauma: Secondary | ICD-10-CM

## 2015-12-01 DIAGNOSIS — Y929 Unspecified place or not applicable: Secondary | ICD-10-CM | POA: Insufficient documentation

## 2015-12-01 DIAGNOSIS — G8929 Other chronic pain: Secondary | ICD-10-CM | POA: Insufficient documentation

## 2015-12-01 DIAGNOSIS — R930 Abnormal findings on diagnostic imaging of skull and head, not elsewhere classified: Secondary | ICD-10-CM

## 2015-12-01 DIAGNOSIS — Z981 Arthrodesis status: Secondary | ICD-10-CM

## 2015-12-01 LAB — URINALYSIS-COMPLETE
BILIRUBIN URINE: NEGATIVE
GLUCOSE URINE: NEGATIVE mg/dL
KETONES: NEGATIVE mg/dL
LEUK. ESTERASE: NEGATIVE
NITRITE URINE: NEGATIVE
PH URINE: 6.5 (ref 4.8–7.8)
RBC: 18 /HPF — AB (ref 0–5)
UROBILINOGEN.: NEGATIVE mg/dL (ref ?–2.0)

## 2015-12-01 LAB — HEPATIC FUNCTION PANEL
ALANINE TRANSFERASE (ALT): 24 U/L (ref 6–63)
ALBUMIN: 4.2 g/dL (ref 3.4–4.8)
ALKALINE PHOSPHATASE (ALP): 76 U/L (ref 35–115)
ASPARTATE TRANSAMINASE (AST): 23 U/L (ref 15–43)
BILIRUBIN DIRECT: 0.2 mg/dL (ref 0.0–0.2)
BILIRUBIN TOTAL: 1 mg/dL (ref 0.3–1.3)
PROTEIN: 6.8 g/dL (ref 6.3–8.3)

## 2015-12-01 LAB — BASIC METABOLIC PANEL
CALCIUM: 9.4 mg/dL (ref 8.6–10.5)
CARBON DIOXIDE TOTAL: 25 meq/L (ref 24–32)
CHLORIDE: 108 meq/L (ref 95–110)
CREATININE BLOOD: 1.01 mg/dL (ref 0.44–1.27)
GLUCOSE: 97 mg/dL (ref 70–99)
POTASSIUM: 3.9 meq/L (ref 3.3–5.0)
SODIUM: 141 meq/L (ref 135–145)
UREA NITROGEN, BLOOD (BUN): 16 mg/dL (ref 8–22)

## 2015-12-01 LAB — TYPE AND SCREEN
ANTIBODY SCREEN (ORTHO GEL): NEGATIVE
PATIENT BLOOD TYPE: A POS

## 2015-12-01 LAB — UR DRUGS OF ABUSE SCREEN
AMPHETAMINE SCREEN, URINE: POSITIVE ng/mL — AB
BARBITURATES SCREEN, URINE: NEGATIVE ng/mL
BENZODIAZEPINES SCREEN, URINE: NEGATIVE ng/mL
COCAINE METABOLITE SCRN, URINE: NEGATIVE ng/mL
OPIATES SCREEN, URINE: NEGATIVE ng/mL

## 2015-12-01 LAB — CBC NO DIFFERENTIAL
HEMATOCRIT: 45 % (ref 40–52)
HEMATOCRIT: 38.6 % — AB (ref 40–52)
HEMATOCRIT: 39.6 % — AB (ref 40–52)
HEMOGLOBIN: 15.5 g/dL (ref 14.0–18.0)
HEMOGLOBIN: 13.8 g/dL — AB (ref 14.0–18.0)
HEMOGLOBIN: 13.9 g/dL — AB (ref 14.0–18.0)
MCH: 31.9 pg (ref 27–33)
MCH: 33.4 pg — AB (ref 27–33)
MCH: 32.3 pg (ref 27–33)
MCHC: 34.5 % (ref 32–36)
MCHC: 35.8 % (ref 32–36)
MCHC: 35 % (ref 32–36)
MCV: 92.7 UM3 (ref 80–100)
MCV: 93.2 UM3 (ref 80–100)
MCV: 92.4 UM3 (ref 80–100)
MPV: 7.9 UM3 (ref 6.8–10.0)
MPV: 8 UM3 (ref 6.8–10.0)
MPV: 7.7 UM3 (ref 6.8–10.0)
PLATELET COUNT: 325 10*3/uL (ref 130–400)
PLATELET COUNT: 275 10*3/uL (ref 130–400)
PLATELET COUNT: 282 10*3/uL (ref 130–400)
RDW: 14 U (ref 0–14.7)
RDW: 13.9 U (ref 0–14.7)
RDW: 14.1 U (ref 0–14.7)
RED CELL COUNT: 4.86 10*6/uL (ref 4.5–5.9)
RED CELL COUNT: 4.14 10*6/uL — AB (ref 4.5–5.9)
RED CELL COUNT: 4.29 10*6/uL — AB (ref 4.5–5.9)
WHITE BLOOD CELL COUNT: 11.5 10*3/uL — AB (ref 4.5–11.0)
WHITE BLOOD CELL COUNT: 8.6 10*3/uL (ref 4.5–11.0)
WHITE BLOOD CELL COUNT: 10 10*3/uL (ref 4.5–11.0)

## 2015-12-01 LAB — BLD GAS VENOUS
BASE EXCESS, VEN: 2 meq/L (ref <=2)
FIO2(%), VEN: 21 %
HCO3, VEN: 26 meq/L (ref 20–28)
O2 SAT, VEN: 66 % — AB (ref 70–100)
PCO2, VEN: 39 mmHg (ref 35–50)
PH, VEN: 7.42 — AB (ref 7.3–7.4)
PO2, VEN: 34 mmHg (ref 30–55)

## 2015-12-01 LAB — ETHANOL, PLASMA: ETHANOL, PLASMA: NEGATIVE mg/dL

## 2015-12-01 LAB — LIPASE: LIPASE: 20 U/L (ref 13–51)

## 2015-12-01 LAB — APTT STUDIES: APTT: 30.9 s (ref 24.1–36.7)

## 2015-12-01 LAB — INR: INR: 0.96 (ref 0.87–1.18)

## 2015-12-01 MED ORDER — NACL 0.9% IV BOLUS - DURATION REQ
1000.0000 mL | Freq: Once | INTRAVENOUS | Status: AC
Start: 2015-12-01 — End: 2015-12-01
  Administered 2015-12-01: 1000 mL via INTRAVENOUS

## 2015-12-01 MED ORDER — FENTANYL (PF) 50 MCG/ML INJECTION SOLUTION
25.0000 ug | Freq: Once | INTRAMUSCULAR | Status: AC
Start: 2015-12-01 — End: 2015-12-01
  Administered 2015-12-01: 25 ug via INTRAVENOUS
  Filled 2015-12-01: qty 2

## 2015-12-01 MED ORDER — IOHEXOL 350 MG IODINE/ML INTRAVENOUS SOLUTION
125.0000 mL | INTRAVENOUS | Status: AC
Start: 2015-12-01 — End: 2015-12-01
  Administered 2015-12-01: 125 mL via INTRAVENOUS

## 2015-12-01 MED ORDER — FENTANYL (PF) 50 MCG/ML INJECTION SOLUTION
50.0000 ug | INTRAMUSCULAR | Status: DC | PRN
Start: 2015-12-01 — End: 2015-12-02
  Administered 2015-12-01 (×2): 50 ug via INTRAVENOUS
  Filled 2015-12-01 (×2): qty 2

## 2015-12-01 MED ORDER — FENTANYL (PF) 50 MCG/ML INJECTION SOLUTION
50.0000 ug | Freq: Once | INTRAMUSCULAR | Status: AC
Start: 2015-12-01 — End: 2015-12-02
  Administered 2015-12-02: 50 ug via INTRAVENOUS
  Filled 2015-12-01: qty 2

## 2015-12-01 MED ORDER — HYDROCODONE 5 MG-ACETAMINOPHEN 325 MG TABLET
2.0000 | ORAL_TABLET | Freq: Once | ORAL | Status: AC
Start: 2015-12-01 — End: 2015-12-01
  Administered 2015-12-01: 2 via ORAL
  Filled 2015-12-01: qty 2

## 2015-12-01 NOTE — ED Initial Note (Signed)
EMERGENCY DEPARTMENT PHYSICIAN NOTE - Carl Mata#*       Date of Service:   12/01/2015  7:00 PM Patient's PCP: No Pcp No Pcp (Inactive)   Note Started: 12/01/2015 19:33 DOB: 1975/09/27             Chief Complaint   Patient presents with    *911:Blunt/Critical Trauma Level I     GLF, lower back, numbness, tingling, incontinence        911 Trauma Activation    The history provided by the patient.  Interpreter used: No    Carl Mata#* is a 40yr old male, with a past medical history significant for thoracic spine injury after fall from 10 feet s/p thoracic spinal fusion and laminectomy in 2014, bipolar disorder, methamphetamine abuse, who presents to the ED BIB by his friend s/p unwitnessed GLF after syncopal episode that began earlier this afternoon. Patient states that he was outside in the hot weather all day and working in the shade when he felt a prodrome of lightheadedness, suddenly passed out, and later woke up on the ground. Unknown down time. Reports that he was able to ambulate with a wobbly gait after waking up, but he asked his friend to bring him to the ED after experiencing acute on chronic lower back pain with associated numbness and weakness to his bilateral lower extremities. Pain is severe, sharp, constant, worse with movement. No alleviating factors. States that he has never had a syncopal episode in the past. Incontinent to stool. Denies chest pain, shortness of breath, palpitations, nausea, vomiting, weakness, paresthesias. No history of seizures. Denies having a seizure today. No blood thinners.    A full history, including pertinent past medical and social history was reviewed.    HISTORY:  There are no active hospital problems to display for this patient.   Allergies   Allergen Reactions    Nsaids (Non-Steroidal Anti-Inflammatory Drug) Anaphylaxis      Past Medical History:  No date: Unspecified asthma(493.90)  No date: Unspecified mental or behavioral problem Past Surgical  History:  04/04/2013: Fusion, spine, thoracic, using posterior techn* N/A  04/04/2013: Laminectomy, thoracic, posterior approach N/A   Social History    Marital status: MARRIED             Spouse name:                       Years of education:                 Number of children:               Occupational History    None on file    Social History Main Topics    Smoking status: Current Every Day Smoker                                                     Packs/day: 0.50      Years: 15.00          Types: Cigarettes       Smokeless status: Not on file                       Alcohol use: Yes  Comment: 4-5 beers a day    Drug use: Yes                Special: Methamphetamine       Comment: active meth use    Sexual activity: Not on file          Other Topics            Concern    None on file    Social History Narrative    As of 04/07/2013:     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 [place] parole in [place]. Used to work as a Designer, fashion/clothingroofer.    Lives in [place], currently "on the street," has a girlfriend but does not live with her. Doesn't know where he will live next. Says his GF's worker is looking into this.    H/o alcohol dependence (reports being sober for 1 year), + tobacco, + drug use (meth; reports last use >1 year ago).    ---------------------------     Review of patient's family history indicates:    No FH spine problems [OTHER]   Other                        Review of Systems   Constitutional: Negative for chills and fever.   HENT: Negative for congestion and sore throat.    Eyes: Negative for visual disturbance.   Respiratory: Negative for cough, chest tightness and shortness of breath.    Cardiovascular: Negative for chest pain and palpitations.   Gastrointestinal: Negative for abdominal pain, diarrhea, nausea and vomiting.        Stool incontinence   Genitourinary: Negative for decreased urine volume and dysuria.   Musculoskeletal: Positive for  back pain. Negative for neck pain.   Skin: Negative for rash.   Neurological: Positive for syncope, light-headedness and numbness. Negative for dizziness, seizures, weakness and headaches.   Psychiatric/Behavioral: Negative for confusion.   All other systems reviewed and are negative.    TRIAGE VITAL SIGNS:  Temp: 36.7 C (98.1 F) (12/01/15 1907)  Temp src: Oral (12/01/15 1907)  Pulse: 96 (12/01/15 1902)  BP: 134/80 (12/01/15 1902)  Resp: 13 (12/01/15 1902)  SpO2: 99 % (12/01/15 1902)  Weight: 63.1 kg (139 lb 1.8 oz) (12/01/15 1907)    Physical Exam   Constitutional: He is oriented to person, place, and time. He appears well-developed and well-nourished. No distress.   HENT:   Head: Normocephalic and atraumatic.   Mouth/Throat: Oropharynx is clear and moist. No oropharyngeal exudate.   No cephalohematomas  No hemotympanum bilaterally  TMs clear bilaterally  No intraoral trauma or dental malocclusion   Eyes: Conjunctivae and EOM are normal. Pupils are equal, round, and reactive to light.   Neck: Neck supple.   No C spine tenderness, deformities, step offs   Cardiovascular: Normal rate, regular rhythm, normal heart sounds and intact distal pulses.  Exam reveals no gallop and no friction rub.    No murmur heard.  Intact radial, femoral, DP pulses   Pulmonary/Chest: Effort normal and breath sounds normal. No respiratory distress. He has no wheezes. He has no rales. He exhibits no tenderness.   No chest wall tenderness, deformities, crepitus  No signs of trauma to the chest wall  Airway intact  Bilateral breath sounds   Abdominal: Soft. Bowel sounds are normal. He exhibits no distension and no mass. There is tenderness. There is no rebound  and no guarding.   LLQ exquisitely tender to palpation   Genitourinary:   Genitourinary Comments: Normal rectal tone; good gluteal squeeze  Incontinent to stool  Brown stool on exam; no gross blood   Musculoskeletal: Normal range of motion. He exhibits tenderness. He exhibits no edema  or deformity.   L spine tenderness  No L spine tenderness  Deformities to the lower T spine and upper L spine  Midline surgical scar to T and L spine  Pelvis stable and non-tender   Neurological: He is alert and oriented to person, place, and time. No cranial nerve deficit.   BLE: 2/5 strength to plantarflexion and dorsiflexion  BUE: 5/5 strength  BLE: no sensation to the medial and lateral shin, knees, and great toes   Skin: Skin is warm and dry. No rash noted. He is not diaphoretic.   Scar to the right knee   Psychiatric: He has a normal mood and affect. His behavior is normal.   Nursing note and vitals reviewed.    INITIAL ASSESSMENT & PLAN, MEDICAL DECISION MAKING, ED COURSE  Louay T Rodrigue#* is a 75yr male who presents with a chief complaint of acute on chronic lower back pain, numbness to BLE.     This patient's differential diagnosis is broad and complex and includes but is not limited to intracranial injury, cervical spine injury, pulmonary injury, intra-abdominal injury, orthopedic injury.    The results of the ED evaluation were notable for the following:    Pertinent lab results:   CBC leukocytosis 11.5; repeat anemic H/H 13.8/38.6; repeat anemic H/H 13.9/39.6  EtOH negative  APTT WNL  INR WNL  Lipase WNL  LFT unremarkable  BMP unremarkable  Type and Screen A+/antibody negative  VBG elevated pH 7.42    Pertinent imaging results (reviewed and interpreted independently by me):   CHEST 1 VIEW  CT C-SPINE WITHOUT CONTRAST  CT ABDOMEN + PELVIS WITH CONTRAST  CT HEAD WITHOUT CONTRAST  CT L-SPINE (2D RECONS L-SPINE FROM ABD/PELVIS)  CT CHEST W CONTRAST  CT T-SPINE (2D RECONS T-SPINE FROM CHEST)    Imaging unremarkable for acute fracture    Radiology reads:   CHEST 1 VIEW  No acute cardiopulmonary process.    CT C-SPINE WITHOUT CONTRAST  No acute fracture or traumatic malalignment of the cervical spine.     CT ABDOMEN + PELVIS WITH CONTRAST  No acute solid organ injury. No acute intraabdominal process.    CT HEAD  WITHOUT CONTRAST  No acute intracranial process.     CT L-SPINE (2D RECONS L-SPINE FROM ABD/PELVIS)  No acute fracture or traumatic malalignment of lumbar spine.    Postsurgical changes of spinal fusion from T10-L2 with unchanged chronic  T12 fracture.    CT CHEST W CONTRAST  No acute intrathoracic process.     Posterior fusion hardware of the lower thoracic and upper lumbar spine is  intact.     No change in appearance of an old T12 compression fracture.     CT T-SPINE (2D RECONS T-SPINE FROM CHEST)  NO ACUTE FRACTURE OR TRAUMATIC MALALIGNMENT OF THORACIC SPINE.    POSTERIOR FUSION HARDWARE T10-L2 WITH UNCHANGED CHRONIC T12 FRACTURE.    Consults: A Consult was obtained from the trauma service to evaluate for disposition.        Pertinent medications:   Fentanyl 25 mcg IV, 50 mcg IV  IV NS 1 L IV    Chart Review: I reviewed the patient's prior medical records. Pertinent information that  is relevant to this encounter: evaluated in 9/29 and 04/14/16 for midline low back pain without sciatica.      REEVALUATION & ED COURSE:  I performed serial evaluations of the patient. Pain improved with IV analgesia.    ATTENDING PHYSICIAN MEDICAL DECISION MAKING:  I saw the patient independently and performed my own focused exam and history. Throughout the patient's ED course, I performed repeated bedside assessments, monitored ongoing vital signs, evaluated and interpreted the diagnostic data, and assessed for response to therapy. The findings of the diagnostic studies performed during this visit were discussed in detail with the patient or their designated representative whenever possible.    Patient Summary:  The patient is a 40 year old man with past medical history and presentation as described above.  We evaluated the patient for the above mentioned differential diagnoses.     Patient non toxic appearing on initial evaluation -- activated as a 911 trauma given neuro findings in the setting of back pain    The patient's work  up was significant for imaging as described above and without acute signs of fracture    Treated with medications for pain.     Suspect symptoms 2/2 acute on chronic back pain. Doubt acute fracture. Initially concerned for spinal cord injury vs nerve injury--further management deferred to trauma team     Patients symptoms improved after treatment.      Plan to admit patient to Trauma for continued work up and management. All questions answered.         LAST VITAL SIGNS:  Temp: 36.7 C (98.1 F) (12/01/15 1907)  Temp src: Oral (12/01/15 1907)  Pulse: 80 (12/01/15 2000)  BP: 125/90 (12/01/15 2000)  Resp: 9 (12/01/15 2000)  SpO2: 97 % (12/01/15 2000)  Weight: 63.1 kg (139 lb 1.8 oz) (12/01/15 1907)      Clinical Impression:   Back pain  Fall   Leg weakness    Disposition: Admit      PATIENT'S GENERAL CONDITION:  Fair: Vital signs are stable and within normal limits. Patient is conscious but may be uncomfortable. Indicators are favorable.     SCRIBE STATEMENT  I, Roland Rebuyon, SCRIBE,  am personally taking down the notes in the presence of Dr. Meriam SpragueShakira Bandolin.  Electronically signed by Sophronia Simas- Roland Rebuyon, SCRIBE, Scribe  12/01/2015  19:33    SCRIBE DISCLAIMER   I, Obie DredgeNorkamari S Bandolin, MD, personally performed the services described in this documentation, as scribed by the trained medical scribe above in my presence, and it is both accurate and complete.     Electronically signed by: Obie DredgeNorkamari S Bandolin, MD, Resident      This patient was seen, evaluated, and care plan was developed with the resident.  I agree with the findings and plan as outlined in our combined note. I personally independently visualized the images and tracings as noted above.      Isa Rankinaniel Kingsley Diezel Mazur, MD      Electronically signed by: Isa Rankinaniel Kingsley Helayna Dun, MD, Attending Physician

## 2015-12-01 NOTE — ED Nursing Note (Signed)
Pt to CT via RN transport with monitor.

## 2015-12-01 NOTE — Allied Health Progress (Signed)
CLINICAL SOCIAL SERVICES   CRISIS SERVICES CRITICAL CARE NOTE  Note Date and Time: 12/01/2015    19:57  Date of Admission: 12/01/2015  7:00 PM    Date of Service: 12/01/2015 Referred By: Hospital    Patient Name: Carl GreenhouseMatthew T Mata#* Ethnicity: White    DOB: 07-07-1975  Age: [age]yr      Reason for Referral: 911 activation; ID patient/notify NOK  Next of Kin:  Phone:    Emergency Contact(s):  Name:  Phone:      Reason for medical treatment: GFL w/ Syncopal  Interpreter Assisting with Interview: none, English   Persons Interviewed: pt, medical team, chart review     HISTORY OF PRESENTING PROBLEM  Patient is a 40 yo White male BIB friend after he apparently had an unwitnessed GFL with syncopal episode.  Per pt, he'd been outside all day but in the shade when he woke up on the ground.  Pt reported he could walk slowly after the incident, made it half way to the hospital and then had a friend bring him the rest of the way.  Pt arrived complaining of chronic but more severe back pain (hx of back surgery) bilateral leg pain and incontinence to urine.  Once examined by MD, pt was found to be incontinent with stool as well.  DEMOGRAPHICS:   [address]  [place] [id number]  Phone: [phone] (home)    PSYCHOSOCIAL HISTORY  Pt lis homeless and has a girlfriend   ASSESSMENT  Per MD, final reads are negative for acute trauma  INTERVENTION   Consulted with the medical team.     Assisted with ID of patient.   Offered to Notify support, pt declined   DISPOSITION / PLAN  Plan to discharge with strict return precautions.  Continued Social Services support will be provided as needed throughout patient's ED stay.        Signature: Dorena DewRegina Marieme Mcmackin, [place]       Pager # [phone]  Licensed Clinical Social Worker

## 2015-12-01 NOTE — ED Triage Note (Signed)
911 from [facility] to resus.  GLF, lower back, numbness, tingling, urinary incontinence.

## 2015-12-01 NOTE — Progress Notes (Signed)
CT C spine preliminary reading is negative for any fractures or malalignment.    Exam of patient without cervical spine TTP. Patient with full range of motion without numbness, tingling or pain.   Collar removed and C spine precautions cleared.     Electronically signed by:    Tura Roller, MD  PGY-1   [facility]  Pager:

## 2015-12-01 NOTE — H&P (Addendum)
TRAUMA ADMISSION HISTORY AND PHYSICAL  Age: 40yr Trauma Type: 911   Note Date and Time: 12/01/2015    19:10  Date of Admission: 12/01/2015  7:00 PM    Date of Service: 12/01/2015  Patient's PCP: No Pcp No Pcp (Inactive)      TIME AND DATE CONSULTED BY ED FOR ADMISSION:  n/a  TIME AND DATE SUBSEQUENTLY SEEN BY TRAUMA:  n/a     CODE STATUS: Prior                  CHIEF COMPLAINT  40yr-old male w/ a history offor T12 burst fracture s/p surgical fixture in 04/2013, multiple ED visits for SI, incontinence, and acute on chronic back pain, BIB a friend s/p syncopal episode w/ GLF w/ bilateral leg weakness and numbness following the event. Patient was standing outside in the sun and "woke up on the ground" incontinent of stool with severe pain in his back and legs. Episode occurred around 5. Episode was not witnessed.     No history of seizures. No cardiac history.     Ambulatory following the fall.     HISTORY OF PRESENT ILLNESS  Mechanism of injury:  fall  Location:  body part injured: Back Sharp 8  Duration of symptoms (time): PTA    Context    Seatbelted: N/A  Loss of consciousness: yes -   Unknown  Hypotension prior to arrival:  No  Helmeted: N/A    Mode of transport:  Private car    PAST MEDICAL HISTORY   PMHx:   Past Medical History:   Diagnosis Date    Unspecified asthma(493.90)     Unspecified mental or behavioral problem     bipolar       PSHx:   Past Surgical History:   Procedure Laterality Date    FUSION THORACIC POSTERIOR N/A 04/04/2013    Procedure: FUSION THORACIC POSTERIOR;  Surgeon: Fernande Bras, MD;  [facility];  Service: Neurosurgery    LAMINECTOMY THORACIC POSTERIOR N/A 04/04/2013    Procedure: LAMINECTOMY THORACIC POSTERIOR;  Surgeon: Fernande Bras, MD;  [facility];  Service: Neurosurgery       Allergies:    Nsaids (Non-Steroidal Anti-Inflammatory Drug)    Anaphylaxis    Medications:   Prior to Admission Medications   Prescriptions Last Dose Informant Patient Reported? Taking?   TRAMADOL  HCL (TRAMADOL PO)   Yes No   Sig:       Facility-Administered Medications: None     FAMILY HISTORY   Evaluated and determined to be non-contributory to injury    SOCIAL HISTORY     Social History     Social History    Marital status: MARRIED     Spouse name: N/A    Number of children: N/A    Years of education: N/A     Social History Main Topics    Smoking status: Current Every Day Smoker     Packs/day: 0.50     Years: 15.00     Types: Cigarettes    Smokeless tobacco: Not on file    Alcohol use Yes      Comment: 4-5 beers a day    Drug use: Yes     Special: Methamphetamine      Comment: active meth use    Sexual activity: Not on file     Other Topics Concern    Not on file     Social History Narrative    As of  04/07/2013:     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 Reportedly on parole in [place]. Used to work as a Designer, fashion/clothing.    Lives in [place], currently "on the street," has a girlfriend but does not live with her. Doesn't know where he will live next. Says his GF's worker is looking into this.    H/o alcohol dependence (reports being sober for 1 year), + tobacco, + drug use (meth; reports last use >1 year ago).    ---------------------------     The patient's past medical, family, and social history was reviewed and confirmed.    ROS Good historian  Constitutional: Negative   Head: No head trauma or headache  Eyes: No changes in vision  Ears, Nose, Mouth, Throat: No facial pain or trauma  CV: No chest pain or chest trauma  Resp: No SOB  GI: No abdominal pain, N/V  GU: No GU trauma or dysuria   Back: Positive back trauma and  back pain  Neuro: No dizziness, light-headedness, numbness, tingling, or weakness  MSK:  Denies pain or deformity    VITAL SIGNS  Current:  Temp: 36.7 C (98.1 F)  BP: (!) 122/95  Pulse: 97  Resp: 16  SpO2: 100 %      Weight: 63.1 kg (139 lb 1.8 oz)  Body surface area is 1.71 meters squared.  Body mass index is 22.45  kg/(m^2).    Last Recorded:  Temp: 36.7 C (98.1 F) (12/01/15 1907)   Temp src: Oral (12/01/15 1907)  Pulse: 97 (12/01/15 1906)   BP: (!) 122/95  Resp: 16  SpO2: 100 %        Weight: 63.1 kg (139 lb 1.8 oz)  Body mass index is 22.45 kg/(m^2).  Body surface area is 1.71 meters squared.    GLASGOW COMA SCALE  Eye Opening: Spontaneous - 4  Verbal Response: Oriented - 5  Motor Response: Obeys Command - 6  Total: 15    PHYSICAL EXAM  Head:No evidence of trauma  Eyes: No evidence of trauma, PERRL and EOMI  Ear, Nose, Throat: No Facial Injury and No Dental Injury  Neck:Non-tender without stepoffs and Trachea midline. In c-collar.  Respiratory: Equal Breath Sounds and Chest wall non tender  Cardiovascular: Regular Rate and Rythm  Abdominal: Guarding. Diffusely tender. Greatest in RLQ.   Pelvis:  Pubic symphysis stable  Genital Exam: Genital Exam Normal  Rectal Exam: Normal Tone and Heme Negative. Incontinent of stool.   Back: Non-Tender Without Stepoffs  Neurological:   Sensation: Reports no sensation in bilateral knees, medial/lateral shin, and great toe. Intact diffusely in UE.   Motor: 5/5 grip, arm flexion/extension. 2/5 DF/PF, 2/5 hip flexion, unable to raise legs off bed 2/2 pain.   Reflexes:  CN intact.   Extremities:   UE's: unremarkable. Non-tender. Full ROM. Non-tender.  Positive straight leg raise bilaterally.   RLE: No signs of obvious trauma. Well healed surgical scar of right knee. Full passive ROM. Strength and sensation documented above.   LLE: No obvious signs of trauma.   Pulses: 2+ bilateral radial, 2+ bilateral pedal, 2+ bilateral femoral    LAB RESULTS  Recent labs for the past 72 hours     12/01/15 2039 12/01/15 2010 12/01/15 1900    HEMATOCRIT 39.6* 38.6* 45.0      Recent labs for the past 24 hours     12/01/15 2039    WHITE BLOOD CELL COUNT 10.0  PT/INR (24 hours):   Recent labs for the past 24 hours     12/01/15 1900    PROTHROMBIN TIME --    INR 0.96      BMP (24 hours):   Recent labs for the  past 24 hours     12/01/15 1900    SODIUM 141    POTASSIUM 3.9    CHLORIDE 108    CARBON DIOXIDE TOTAL 25    CREATININE BLOOD 1.01    E-GFR, AFRICAN AMERICAN >60    E-GFR, NON-AFRICAN AMERICAN >60    UREA NITROGEN, BLOOD (BUN) 16    GLUCOSE 97    CALCIUM 9.4     Recent labs for the past 24 hours     12/01/15 1900    PROTEIN 6.8    ALBUMIN 4.2    BILIRUBIN TOTAL 1.0    ALKALINE PHOSPHATASE (ALP) 76    ASPARTATE TRANSAMINASE (AST) 23    ALANINE TRANSFERASE (ALT) 24    BILIRUBIN DIRECT 0.2       No results found for this basename: CAIONWB:*,CARBOXHGBART:* in the last 24 hours     TOX SCREEN   -Drugs of abuse  Recent labs for the past 72 hours     12/01/15 2205    BARBITURATES SCREEN, URINE Negative    BENZODIAZEPINES SCREEN, URINE Negative    COCAINE METABOLITE SCRN, URINE Negative    OPIATES SCREEN, URINE Negative    UR DRUGS BY GC --         -Serum blood alcohol - non forensic  Lab Results   Component Value Date    ETOH 149 (H) 03/06/2015       IMAGING  All studies preliminary unless noted:  CHEST 1 VIEW    CT C-SPINE WITHOUT CONTRASTNo acute fracture or traumatic malalignment of the cervical spine. (Fellow Final Read note in PACs)    CT ABDOMEN + PELVIS WITH CONTRAST: No acute solid organ injury. No acute intraabdominal process.    CT HEAD WITHOUT CONTRAST: No acute intracranial process. (Fellow Final Read note in PACs)    CT L-SPINE (2D RECONS L-SPINE FROM ABD/PELVIS): No acute fracture or traumatic malalignment of lumbar spine. (Final)    Postsurgical changes of spinal fusion from T10-L2 with unchanged chronic  T12 fracture.    CT CHEST W CONTRAST: No acute intrathoracic process.     Posterior fusion hardware of the lower thoracic and upper lumbar spine is  intact.     No change in appearance of an old T12 compression fracture.  (Final)    CT T-SPINE (2D RECONS T-SPINE FROM CHEST): NO ACUTE FRACTURE OR TRAUMATIC MALALIGNMENT OF THORACIC SPINE.    POSTERIOR FUSION HARDWARE T10-L2 WITH UNCHANGED  CHRONIC T12 FRACTURE. (Final)    CHEST 1 VIEW      LAB TESTS/STUDIES  I personally reviewed the imaging, labs, and clinical course with the trauma service.    ASSESSMENT / PLANS    Assessment: 40 year old male with chronic back pain who presents s/p GLF complaining on acute on chronic back pain. Pain controlled. Symptoms resolved by repeat examination 2 hours later. Symptoms remain resolved and patient ambulated without issues (1:20am). Patient tolerated PO. Patient has safe place to be discharged. Final reads negative for acute trauma. Patient discharged with strict return precautions. Patient mentation WNL. Cleared with trauma fellow prior to discharge.     Blunt Head Trauma - Patient initially endorsed LOC, however, on further question patient denied and remembers fall. Denies syncope.  CT head negative. Mentation WNL.     Blunt Cspine Trauma - Patient was placed in c collar on arrival. C spine cleared following preliminary read. Full ROM. Exam WNL. No Midline tenderness.     Blunt Chest Trauma - No chest trauma. CT chest unremarkable for acute pathology.     Blunt Tspine Trauma - No T spine tenderness     Blunt Abdominal Trauma -No abdominal trauma.     Blunt L Spine Trauma - No L spine tenderness.     Extremity Trauma - No extremity trauma. Non-tender lower extremities on repeat examination.     Incidental Findings: None.    DISPO -- HOME     PRESENT ON ADMISSION:  Are any of the following five conditions present or suspected on admission: decubitus ulcer, infection from an intravascular device, infection due to an indwelling catheter, surgical site infection or pneumonia? No.    Report Electronically Signed By:   Fermin Schwabobert Rominski, MD  PGY-1, Emergency Medicine  P: [phone]  Pager: [phone]    Trauma Attending    40 yo male with hx above.  Seems to have had a syncopal episode.  W/u neg for injury.  Symptoms resolved and pt was ambulatory.  Pt was d/c'd home.    Oran Reinavid V. Titus MouldShatz, MD  Professor of Surgery  PI  [phone]#[id number]  [phone] (pager)

## 2015-12-01 NOTE — ED Nursing Note (Signed)
Pt brought back by WC to R01, states he got knocked out/passed out/felt dizzy, and was afraid to call 911.  Pt arrives without c-collar, placed per Dr. Chad CordialBandolin.

## 2015-12-01 NOTE — ED Nursing Note (Signed)
Pt states he fell/passed out at approx 1730, states he could walk at the time, but very slowly.  Pt did not want to call 911, states he made it part way to ED, then friend dropped him off.  Pt is A&Ox4, VSS, pt able to lift BLE very slightly, denies sensation.  See trauma survey.

## 2015-12-02 MED ORDER — HYDROCODONE 5 MG-ACETAMINOPHEN 325 MG TABLET
1.0000 | ORAL_TABLET | Freq: Three times a day (TID) | ORAL | 0 refills | Status: AC | PRN
Start: 2015-12-02 — End: 2015-12-05

## 2015-12-02 MED ORDER — HYDROCODONE 10 MG-ACETAMINOPHEN 325 MG TABLET
1.0000 | ORAL_TABLET | Freq: Once | ORAL | Status: AC
Start: 2015-12-02 — End: 2015-12-02
  Administered 2015-12-02: 1 via ORAL
  Filled 2015-12-02: qty 1

## 2015-12-02 NOTE — ED Nursing Note (Signed)
Patient tolerating POs. Pain to RLE continues. Patient alert and oriented with no signs of distress.

## 2015-12-02 NOTE — ED Nursing Note (Signed)
Pt ambulated around pod. Tolerated well. Ate crackers and juice. Tolerated well. Trauma at bedside assessing pt.

## 2015-12-02 NOTE — ED Nursing Note (Signed)
Discharge instructions given to pt. Verbalized understanding. No further questions or concerns. Pt pink, warm and dry. Respirations even and unlabored. No signs of distress. Pt ambulated out of room independently with steady gait. All pt belongings sent home with pt.

## 2015-12-02 NOTE — ED Nursing Note (Signed)
Report from Danielle, RN. Care transferred.

## 2015-12-02 NOTE — Discharge Instructions (Signed)
You were evaluated in the ED for your pain and paresthesia. You pain was controlled. Your imaging was unchanged from priors. You were re-examined and the pain had resolved. You walked without issues. You were provided juice and crackers.    Return to the ED if you develop fevers, chills, severe pain, severe weakness, or have another fall.

## 2015-12-03 ENCOUNTER — Emergency Department
Admission: EM | Admit: 2015-12-03 | Discharge: 2015-12-04 | Discharge status: Against Medical Advice | Payer: MEDICAID | Attending: Emergency Medicine | Admitting: Emergency Medicine

## 2015-12-03 DIAGNOSIS — Z029 Encounter for administrative examinations, unspecified: Principal | ICD-10-CM | POA: Insufficient documentation

## 2015-12-03 NOTE — ED Triage Note (Signed)
Hx thoracic spine injury after fall from 10 feet s/p thoracic spinal fusion and laminectomy in 2014, bipolar disorder, methamphetamine abuse here for eval lower backpain,no trauma reported he was seen last week for same problem

## 2015-12-04 NOTE — ED Nursing Note (Signed)
Attempt to call pt to rapid care via intercom, no answer

## 2015-12-04 NOTE — ED Nursing Note (Signed)
No Answer" note: The patient was called using the ED PA system. The patient was not found after a search of the ED Waiting Room Areas.

## 2015-12-14 ENCOUNTER — Emergency Department
Admission: EM | Admit: 2015-12-14 | Discharge: 2015-12-14 | Disposition: A | Discharge status: Home / Self Care | Payer: MEDICAID | Attending: Emergency Medicine | Admitting: Emergency Medicine

## 2015-12-14 DIAGNOSIS — J45909 Unspecified asthma, uncomplicated: Secondary | ICD-10-CM

## 2015-12-14 DIAGNOSIS — F1510 Other stimulant abuse, uncomplicated: Secondary | ICD-10-CM | POA: Insufficient documentation

## 2015-12-14 DIAGNOSIS — F15151 Other stimulant abuse with stimulant-induced psychotic disorder with hallucinations: Secondary | ICD-10-CM | POA: Insufficient documentation

## 2015-12-14 DIAGNOSIS — F319 Bipolar disorder, unspecified: Secondary | ICD-10-CM | POA: Insufficient documentation

## 2015-12-14 DIAGNOSIS — F250 Schizoaffective disorder, bipolar type: Secondary | ICD-10-CM | POA: Insufficient documentation

## 2015-12-14 DIAGNOSIS — F17210 Nicotine dependence, cigarettes, uncomplicated: Secondary | ICD-10-CM | POA: Insufficient documentation

## 2015-12-14 DIAGNOSIS — F15951 Other stimulant use, unspecified with stimulant-induced psychotic disorder with hallucinations: Principal | ICD-10-CM | POA: Insufficient documentation

## 2015-12-14 HISTORY — DX: Bipolar disorder, unspecified: F31.9

## 2015-12-14 HISTORY — DX: Schizoaffective disorder, unspecified: F25.9

## 2015-12-14 LAB — CBC WITH DIFFERENTIAL
BASOPHILS % AUTO: 0.9 %
BASOPHILS ABS AUTO: 0.1 10*3/uL (ref 0–0.2)
EOSINOPHIL % AUTO: 1.9 %
EOSINOPHIL ABS AUTO: 0.2 10*3/uL (ref 0–0.5)
HEMATOCRIT: 42.5 % (ref 40–52)
HEMOGLOBIN: 15.1 g/dL (ref 14.0–18.0)
LYMPHOCYTE ABS AUTO: 2.6 10*3/uL (ref 1.0–4.8)
LYMPHOCYTES % AUTO: 31.3 %
MCH: 33.3 pg — AB (ref 27–33)
MCHC: 35.6 % (ref 32–36)
MCV: 93.6 UM3 (ref 80–100)
MONOCYTES % AUTO: 7 %
MONOCYTES ABS AUTO: 0.6 10*3/uL (ref 0.1–0.8)
MPV: 7.7 UM3 (ref 6.8–10.0)
NEUTROPHIL ABS AUTO: 4.9 10*3/uL (ref 1.80–7.70)
NEUTROPHILS % AUTO: 58.9 %
PLATELET COUNT: 291 10*3/uL (ref 130–400)
RDW: 14.2 U (ref 0–14.7)
RED CELL COUNT: 4.54 10*6/uL (ref 4.5–5.9)
WHITE BLOOD CELL COUNT: 8.4 10*3/uL (ref 4.5–11.0)

## 2015-12-14 LAB — BASIC METABOLIC PANEL
CALCIUM: 9.4 mg/dL (ref 8.6–10.5)
CARBON DIOXIDE TOTAL: 25 meq/L (ref 24–32)
CHLORIDE: 106 meq/L (ref 95–110)
CREATININE BLOOD: 0.87 mg/dL (ref 0.44–1.27)
GLUCOSE: 94 mg/dL (ref 70–99)
POTASSIUM: 3.5 meq/L (ref 3.3–5.0)
SODIUM: 137 meq/L (ref 135–145)
UREA NITROGEN, BLOOD (BUN): 15 mg/dL (ref 8–22)

## 2015-12-14 LAB — HEPATIC FUNCTION PANEL
ALANINE TRANSFERASE (ALT): 18 U/L (ref 6–63)
ALBUMIN: 3.9 g/dL (ref 3.4–4.8)
ALKALINE PHOSPHATASE (ALP): 79 U/L (ref 35–115)
ASPARTATE TRANSAMINASE (AST): 21 U/L (ref 15–43)
BILIRUBIN DIRECT: 0.2 mg/dL (ref 0.0–0.2)
BILIRUBIN TOTAL: 1 mg/dL (ref 0.3–1.3)
PROTEIN: 6.4 g/dL (ref 6.3–8.3)

## 2015-12-14 LAB — ACETAMINOPHEN

## 2015-12-14 LAB — CREATINE KINASE: CREATINE KINASE: 244 U/L (ref 0–250)

## 2015-12-14 LAB — ETHANOL, PLASMA: ETHANOL, PLASMA: NEGATIVE mg/dL

## 2015-12-14 LAB — SALICYLATE,BLOOD: SALICYLATE,BLOOD: NEGATIVE mg/dL

## 2015-12-14 MED ORDER — LORAZEPAM 1 MG TABLET
1.0000 mg | ORAL_TABLET | Freq: Once | ORAL | Status: AC
Start: 2015-12-14 — End: 2015-12-14
  Administered 2015-12-14: 1 mg via ORAL
  Filled 2015-12-14: qty 1

## 2015-12-14 MED ORDER — ACETAMINOPHEN 500 MG TABLET
1000.0000 mg | ORAL_TABLET | Freq: Once | ORAL | Status: AC
Start: 2015-12-14 — End: 2015-12-14
  Administered 2015-12-14: 1000 mg via ORAL
  Filled 2015-12-14 (×2): qty 2

## 2015-12-14 MED ORDER — NACL 0.9% IV BOLUS - DURATION REQ
2000.0000 mL | Freq: Once | INTRAVENOUS | Status: AC
Start: 2015-12-14 — End: 2015-12-14
  Administered 2015-12-14: 2000 mL via INTRAVENOUS

## 2015-12-14 NOTE — ED Nursing Note (Signed)
Resumed care from Casimiro NeedleMichael, RN after 1-hour break relief. Pt requesting PIV to be removed and wants to leave, endorses that he does not want to wait for AVS.

## 2015-12-14 NOTE — ED Nursing Note (Addendum)
Patient discharged from ED related instructions and all belongings REFUSE. Patient is in NAD, is awake/alert skin, is pink/warm/dry. Pt leaves the ED ambulatory with self. Refuse vitals.

## 2015-12-14 NOTE — ED Initial Note (Signed)
EMERGENCY DEPARTMENT PHYSICIAN NOTE - [facility]SAY       Date of Service:   12/14/2015  1:35 AM Patient's PCP: No Pcp No Pcp (Inactive)   Note Started: 12/14/2015 01:51 DOB: 06-30-1975             Chief Complaint   Patient presents with    Assaultive or Suicidal Behavior           The history provided by the patient.  Interpreter used: No    Carl Mata is a 40yr old male, with a past medical history significant for Bipolar 1 disorder and Schizoaffective disorder, who presents to the ED for psych evluation that began today. Patient reports that he has been using increasing amounts of Methamphetamine for the last 3 weeks. Reports that he is having worsening auditory/visual hallucinations which are causing him to have headaches, suicidal ideations and thoughts of self-injury. Denies a specific plan but says that he could jump in front of a car/train. Also endorses occasional EtOH use but denies any symptoms of withdrawals in the past. Patient concerned about his worsening psych condition which prompted him to come to the ED tonight.     Symptoms have been persistent and gradually worsening since onset since onset. No alleviating or aggravating factors. No other associated symptoms. Patient denies HI, fever, diaphoresis, chills, SOB, cough, chest pain, abdominal pain, bloody/dark stools, nausea, vomiting, diarrhea, headaches, dizziness, light-headedness, numbness, or weakness.    Patient reports history of psych facility treatment in the past    A full history, including pertinent past medical and social history was reviewed.    HISTORY:  There are no active hospital problems to display for this patient.   Allergies   Allergen Reactions    Nsaids (Non-Steroidal Anti-Inflammatory Drug) Anaphylaxis      Past Medical History:  No date: Bipolar 1 disorder  No date: Schizoaffective disorder  No date: Unspecified asthma(493.90)  No date: Unspecified mental or behavioral problem Past Surgical History:  04/04/2013: Fusion,  spine, thoracic, using posterior techn* N/A  04/04/2013: Laminectomy, thoracic, posterior approach N/A   Social History    Marital status: MARRIED             Spouse name:                       Years of education:                 Number of children:               Occupational History    None on file    Social History Main Topics    Smoking status: Current Every Day Smoker                                                     Packs/day: 0.50      Years: 15.00          Types: Cigarettes       Smokeless status: Not on file                       Alcohol use: Yes                Comment: 4-5 beers a day    Drug use: Yes  Special: Methamphetamine       Comment: active meth use    Sexual activity: Not on file          Other Topics            Concern    None on file    Social History Narrative    As of 04/07/2013:     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 [place] on parole in [place]. Used to work as a Designer, fashion/clothingroofer.    [place] currently "[place] the street," has a girlfriend but does not live with her. Doesn't know where he will live next. Says his GF's worker is looking into this.    H/o alcohol dependence (reports being sober for 1 year), + tobacco, + drug use (meth; reports last use >1 year ago).    ---------------------------     Review of patient's family history indicates:    No FH spine problems [OTHER]   Other                              Review of Systems   Constitutional: Negative for chills, diaphoresis and fever.   Respiratory: Negative for cough and shortness of breath.    Cardiovascular: Negative for chest pain.   Gastrointestinal: Negative for abdominal pain, blood in stool, diarrhea, nausea and vomiting.   Neurological: Negative for dizziness, weakness, light-headedness, numbness and headaches.   Psychiatric/Behavioral: Positive for hallucinations, self-injury and suicidal ideas.       TRIAGE VITAL SIGNS:  Temp: 36.9 C (98.4 F) (12/14/15  0134)  Temp src: Oral (12/14/15 0357)  Pulse: 81 (12/14/15 0134)  BP: 151/82 (12/14/15 0134)  Resp: 15 (12/14/15 0134)  SpO2: 97 % (12/14/15 0134)  Weight: (not recorded)    Physical Exam   Constitutional: He is oriented to person, place, and time. No distress.   Patient in NAD  Disheveled appearing   HENT:   Head: Normocephalic and atraumatic.   Mouth/Throat: Oropharynx is clear and moist.   Eyes: Conjunctivae and EOM are normal. Pupils are equal, round, and reactive to light.   Neck: Normal range of motion. Neck supple.   Cardiovascular: Normal rate, regular rhythm, normal heart sounds and intact distal pulses.  Exam reveals no gallop and no friction rub.    No murmur heard.  Pulses:       Radial pulses are 2+ on the right side, and 2+ on the left side.        Dorsalis pedis pulses are 2+ on the right side, and 2+ on the left side.   Pulmonary/Chest: Effort normal. No respiratory distress. He has no wheezes. He has no rales.   Abdominal: Soft. Bowel sounds are normal. He exhibits no distension. There is no tenderness. There is no rebound and no guarding.   Musculoskeletal: Normal range of motion. He exhibits no edema or tenderness.   Neurological: He is alert and oriented to person, place, and time. No cranial nerve deficit.   Skin: Skin is warm and dry. No rash noted. He is not diaphoretic.   Psychiatric: His speech is normal. He is actively hallucinating. He exhibits a depressed mood. He expresses suicidal ideation. He expresses no homicidal ideation. He expresses suicidal plans. He expresses no homicidal plans.   Nursing note and vitals reviewed.        INITIAL ASSESSMENT & PLAN, MEDICAL DECISION MAKING, ED COURSE  Carl Mata is a 34yr male who presents with a chief complaint of psych evaluation.     Differential includes, but is not limited to: decompensated psychiatric illness, psychosocial stressors, substance use/abuse, medication noncompliance.         The results of the ED evaluation were notable for  the following:    Pertinent medications:   Ativan 1 mg PO    Consults: A Consult was obtained from the [facility] service to evaluate for 5150 hold. They recommend no hold and discussion with drug counselors.      Chart Review: I reviewed the patient's prior medical records. Pertinent information that is relevant to this encounter is note from 02/09/15 for similar chief complaint as tonights.    Lab Results   Lab Name Value Date/Time    WBC 8.4 12/14/2015 02:50 AM    HGB 15.1 12/14/2015 02:50 AM    HCT 42.5 12/14/2015 02:50 AM    PLT 291 12/14/2015 02:50 AM     BASIC METABOLIC PANEL   Recent labs for the past 72 hours     12/14/15 0250    GLUCOSE 94    UREA NITROGEN, BLOOD (BUN) 15    CREATININE BLOOD 0.87    SODIUM 137    POTASSIUM 3.5    CHLORIDE 106    CARBON DIOXIDE TOTAL 25    CALCIUM 9.4     Lab Results   Lab Name Value Date/Time    AST 21 12/14/2015 02:50 AM    ALT 18 12/14/2015 02:50 AM    ALP 79 12/14/2015 02:50 AM    ALB 3.9 12/14/2015 02:50 AM    TP 6.4 12/14/2015 02:50 AM    TBIL 1.0 12/14/2015 02:50 AM     CK: 244  Acetaminophen: <10  Salicylate: Negative  Ethanol: Negative    Patient Summary: Carl Mata is a 40yr old man with methamphetamine abuse and ?schizoaffective disorder, who presents for worsening hallucinations/SI in the setting of methamphetamine abuse.  His ED course was notable for normal labs and normal vitals throughout.  He was seen by [facility] and they are not recommending a hold at this time.  They are recommending substance abuse counseling this day shift following metabolizing of his methamphetamine abuse.  He was given fluids and ativan with improvement in his symptoms.  At time of signout, he was awaiting discussion with substance abuse counseling, but anticipate he will be discharged with f/u with PCP.     LAST VITAL SIGNS:  Temp: 36.6 C (97.9 F) (12/14/15 0357)  Temp src: Oral (12/14/15 0357)  Pulse: 76 (12/14/15 0357)  BP: 132/86 (12/14/15 0357)  Resp: 16 (12/14/15 0357)  SpO2: 98  % (12/14/15 0357)  Weight: (not recorded)      Clinical Impression:   Methamphetamine abuse with drug induced psychosis     Disposition: Pending      PATIENT'S GENERAL CONDITION:  Good: Vital signs are stable and within normal limits. Patient is conscious and comfortable. Indicators are excellent.     SCRIBE STATEMENT  I, Barnett Hatter, SCRIBE,  am personally taking down the notes in the presence of Dr. Donnal Debar.  Electronically signed by - Barnett Hatter, SCRIBE, Scribe  12/14/2015  01:51      SCRIBE DISCLAIMER   I, Brantley Persons, MD, personally performed the services described in this documentation, as scribed by the trained medical scribe above in my presence, and it is both accurate and complete.     Electronically signed by: Francee Piccolo  Ledell NossJ Bays, MD, Resident      This patient was seen, evaluated, and care plan was developed with the resident.  I agree with the findings and plan as outlined in our combined note. I personally independently visualized the images and tracings as noted above.      Romona CurlsErik Gopal Alfard Cochrane, MD      Electronically signed by: Romona CurlsErik Gopal Havah Ammon, MD, Attending Physician

## 2015-12-14 NOTE — ED Triage Note (Addendum)
Pt biba after calling [facility] with thoughts of hurting himself. Pt admits to 3 weeks of meth. Pt states SI. Pt VSS. Pt a/o x4. Pt cooperative. Pt straight back to room and [facility]ES notified for assessment. NAD

## 2015-12-14 NOTE — ED Nursing Note (Addendum)
Pt c/o 7/10 "pounding" headache. When RN asked what he usually takes for pain, pt states "I'll take whatever they give me." MD Zollie BeckersWalter aware that pt c/o headache, new orders placed. RN offered Tylenol to pt as ordered, pt states "That won't work." Refusing medication as ordered, documented on MAR. Pending consult w/ Tommy.

## 2015-12-14 NOTE — Allied Health Progress (Signed)
CLINICAL SOCIAL SERVICES   [facility] Referral  Note Date & Time: 12/14/2015 01:41  Date of Admission: 12/14/2015  1:35 AM    Date of Service: 7/72017 Referred By: medical team   Patient Name: Darryl LentMatthew T Mickiewicz Ethnicity: The patient's reported ethnicity is , and he identifies as Not Hispanic or Latino.        DOB: 04-13-1976  Age: [age]yr      INTERVENTION/PLAN  Consulted with Triage RN; [facility].       Signature: Clent JacksCraig Paxton Binns, LCSW    Pager [phone]  Licensed Clinical Social Worker

## 2015-12-14 NOTE — ED Nursing Note (Signed)
Pt sleeping no s/s of distress, promote rest/sleep.

## 2015-12-14 NOTE — ED Nursing Note (Signed)
Report Given to Alex NGO

## 2015-12-14 NOTE — ED Nursing Note (Signed)
Assumed care of pt, report received from Alex,RN. Pt is sleeping at this time, respirations equal and unlabored. NAD noted.

## 2015-12-14 NOTE — Allied Health Progress (Signed)
CLINICAL SOCIAL SERVICES   CRISIS SERVICES PROGRESS NOTE  Note Date and Time: 12/14/2015    02:57  Date of Admission: 12/14/2015  1:35 AM    Patient Name: Carl Mata Referral Source: Medical Staff    DOB: 06/21/1975  Age: 89yr     Pt does not meet the criteria for a psychiatric hold but will be connected with our drug intervention specialist Rafael Bihari(Tommy Trevino) to explore substance abuse resources in the morning.  Medical staff advised and agrees with the plan.  Full assessment to follow.        Signature: Emilio MathDuane Morningstar Toft, [place]     Pager # [phone]  Licensed Clinical Social Worker

## 2015-12-14 NOTE — ED Nursing Note (Signed)
Assumed care of patient. Introduced self to patient as Engineer, building servicesprimary RN. Updated pt on plan of care. Patient has no complaints. Is resting on gurney. Skin is warm and dry. rr even and unlabored. VSS

## 2015-12-14 NOTE — ED Nursing Note (Signed)
Pt Sleeping in bed. NAD noted. Will continue to monitor.

## 2015-12-14 NOTE — ED Nursing Note (Signed)
Pt supine on ED gurney, pt appears to be asleep, eyes closed and respirations even and unlabored. NAD noted at this time, will continue to monitor.

## 2015-12-14 NOTE — Allied Health Progress (Addendum)
CLINICAL SOCIAL SERVICES   CRISIS SERVICES PSYCHIATRIC DIAGNOSTIC INTERVIEW  Note Date and Time: 12/14/2015    03:01  Date of Admission: 12/14/2015  1:35 AM    Date of Service: 12/14/2015 Referred By: medical team   Patient Name: Carl Mata Ethnicity: Caucasian    DOB: 1975/09/20  Age: 40yr      Referral Source:Emergency Department Attending: MD Mickie KayLaurin   Reason for Referral: Psychiatric evaluation  Interpreter Assisting with Interview: [name]   Persons [facility]ed: [name] ([facility] Pt, Chart Review, Medical Staff     Next of Kin: [name] (Sister)  [phone]  Other contacts: Civil Service fast streamerarole Officer for 290 offense in which he must registered every 30 days     HISTORY OF PRESENTING ILLNESS  Pt BIBA conveying to triage medical staff that he called 911 because he was having thoughts of hurting himself after using methamphetamine over the last three weeks.  When approached by this writer, pt complained that he was experiencing full body pain, that he had not sleep or eaten in 3 weeks, and that he had been using about an "8-ball of methamphetamine a day." Pt then proclaimed, "I just [facility] to go [name]."  Pt went on and stated, "[place] anyone or myself," "I am not safe around people or out there,"  "all I care about is getting high," "I want to get off the river" "as long as I am not around anyone I will be fine," and "I can't take it anymore.Marland Kitchen.Marland Kitchen.Marland Kitchen.I am walking everywhere."  Pt then explained (when asked), that he was suffering from v/h that entailed visions of women being raped and murdered and visions of his ex-girlfriend "who I still care about."  Pt described his a/h as "trees talking to me."       When asked about current suicide ideation, pt advised he was experiencing s/i and had one previous s/a approximately 6 months ago when he took an unknown quanity of an unknown type of pill.   When asked about a current suicide plan, pt paused and then advised he would likely  jump off a bridge, walk in front of a train or walk out into traffic.  When pt was told I was not putting him on a psychiatric hold but instead  would refer him to our drug specialist to explore AOD resources, pt was initially very irritated but did not threaten suicide and instead protested stating "no one cares about anyone" and "I am going to continue using drugs because no one cares." Once pt expressed his frustration and disappointment that this Clinical research associatewriter was not referring [name], he agreed that a meeting with the [name] in the morning could be beneficial.      PAST PSYCHIATRIC HISTORY(include family)  Per [name], pt has never been linked [place] an [place] health [place].  This could be explained by pts ongoing parole status which would be the entity affording him mental health services.  While pt stated he was no longer on parole, he later admitted he was on parole and had to register every 30 days because he was homeless again indicating he would likely only qualify for mental health services via the parole funding sources.  Pt most recent psychiatric hospitalizations included   -[facility]  [facility] and discharged the same day.  Pt then went to another ED the next  day and was referred to 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  Pt also has a documented history of cocaine and opiate abuse.      PSYCHOSOCIAL HISTORY  Marital Status: Single  Living Situation: homeless  Patient resides with: unknown   Patient's Journalist, newspaper (e.g. Conservator, payee, parent, etc.): Pt must register as a sex offended every 30 days due to his currently homelessness   Patient's Contact Info:   [address]  [place] [address]  Phone: [phone] (home)    Family involved: unknown  Support system: Parole   Patient's education/occupation: Currently unemployed   Building services engineer: Reported no income to this Clinical research associate but stated he gets all the money he wants   Insurance: Medi-Cal/[employer]  Agencies Involved: Parole   Access to guns: Pt advising to can access weapons but doe not have a gun   Patient's Perceptions Regarding Illness, Treatment, and Prognosis: Pt admits to having a drug problem and wanting help because of the side-effects of using the drugs so long (paranoia, seeing shadows, not eating or sleeping for weeks)  Family's / Caregiver's Perceptions Regarding Illness, Treatment, and Prognosis: unknown   MENTAL STATUS EXAM  Alert: yes  Oriented: Person, Place and Time  Appearance: appropriate  Memory: Long-term Intact:  yes  Behaviour: unremarkable and calm but became agitated when told I was not putting him on a hold and would instead refer him to our drug specialist in the morning   Attitude: cooperative  Eye Contact: good  Affect: appropriate  Mood: irritable  Speech: appropriate  Ability to Communicate: good  Concentration: good  Comprehension: good  Insight: fair  Judgement: fair  Decision Making Capacity: fair  Impulse Control: fair-Pt with impulse control issue due to drug addiction but reports no hx of violence   Intellectual Functioning: normal  Thought Process: intact  Thought Content: hallucinations:  Pt reports both a/h and v/h stating he has visions of women being raped and murdered and visions of his girlfriend "who I still care about."  Pt reporting the a/h as "trees talking to me."  Current Suicidal Ideation:  yes Plan: Conditional plan that includes jumping from a bride or getting him by a train or car    Current Homicidal Ideation: no  Plan:  Pt reports no hx of violence       ASSESSMENT   Pt is a 40 year old Caucasian male who self-reports he has been using large qualities of methamphetamine daily over the last three weeks and consequently been unable to sleep, eat or relax and feels that he cannot [place]  When approaching pt, pt immediately made it clear he was passionate about getting into [facility] and explained that he was suffering from v/h (seeing women raped and murder) and a/h (trees are talking to me).  When told pt has a significant drug problem (which we both agreed he already knew) and was  suffering the consequence's primarily associated with chronic methamphetamine use and not chronic and persistent mental health, pt agreed that he would appreciate the safety of the hospital until he can visit with our drug specialist between 8:00A and 9:30A.   Pt does not meet the criteria for a psychiatric hold and has instead agreed to a discharge/safety plan of [facility] Special[facility] and then calling [facility] (as needed or desired).  Pt primary concern about leaving [place] today without some sort of inpatient treatment is his inability to control his drug use and the corresponding anxiety and paranoia (my words).        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 ED AOD Counselor (Tommy T) was made    2-I suspect if pt is not able  to getting into a residential type program (wwhich I prepared him for), he may again start threatening conditional suicidal ideation with a generalized plan to kill himself.  Pt [facility] but complained he could only stay 23 hours and then would be homeless again.      3-I would suggest having pt call [facility] (IF APPROPRIATE) after meeting with Tommy as part of his safety plan.  [facility] been [facility] the past.     Consulted with pts MD and he had also arrived at pt needing drug treatment versus psychiatric hospitalization.   Notified: Family &/or legal representative(s): unable to locate or not applicable  Referrals Provided: None  Counseling Provided by [facility] Social Worker re: Substance abuse  Patient Disposition: Patient may be discharged when medically ready to:   Home    Signature: Emilio MathDuane Jamiee Mil[place], [place]CS[place]    Pager # [phone]  Licensed Clinical Social Worker

## 2015-12-14 NOTE — ED Nursing Note (Signed)
[facility], pt appears to be asleep on ED gurney, respirations even and unlabored. NAD noted at this time. Will continue to monitor.

## 2015-12-14 NOTE — ED Nursing Note (Signed)
Breakfast tray provided to pt, when asked re: allergies, pt endorses that he is allergic to eggs. Late tray requested from dietary to bring an additional tray to accommodate food preference/allergy. Allergy list updated.

## 2015-12-14 NOTE — ED Nursing Note (Signed)
Transfer of care, report given to Alex, RN.

## 2015-12-14 NOTE — ED Nursing Note (Signed)
Pt is AAOx4, GCS 15, no s/s of distress, ambulatory, per Crisis team, pt not on hold; pt refuse to sign discharged paper, all belongings with Pt.

## 2015-12-14 NOTE — ED Nursing Note (Signed)
Pt calm, cooperative. Apple juice and crackers provided to pt. Pt up and ambulatory to bathroom with slow steady gait. NAD noted. Will continue to monitor.

## 2015-12-14 NOTE — Discharge Instructions (Signed)
Thank you for choosing [facility] for your emergency health care needs. It has been our privilege to take care of you today. Your primary complaints have been evaluated based on your history, lab tests, imaging tests and you have been treated for your symptoms and discharged home. Please take all medicines that are prescribed to you as directed (see below).  It is crucial, if you have a primary care physician, to follow up with him or her in the time frame recommended as many health conditions that seem self-limited initially may actually worsen over time.  If you do not have a primary care physician, we will outline the various resources available for you to find one.    If at any time you feel that your condition is worsening, call your doctor or return to the Emergency Department for reevaluation. CALL 911 IF YOU THINK YOU ARE HAVING A MEDICAL EMERGENCY. Return to the Emergency Department if you are unable to obtain the recommended follow-up treatment or you are not better as expected. You can call the [facility] with questions at [phone].    Please realize that the results of some studies that you had done during your stay with [place] (such as x-rays and cultures) have only preliminarily results at this time.  Results of these studies may change as more information becomes available or as the studies are re-evaluated by other members of our health care team in the next few days. We will attempt to contact you with any important changes or additions to the studies that were obtained today, particularly if any of these results require a change in your treatment.    GENERAL PAIN MEDICATION PRECAUTIONS    You may have been prescribed medications for pain today. Some of these may contain a narcotic (Vicodin, Norco, Percocet, etc.) Take these pain medications as prescribed. You also may have been prescribed a muscle relaxant or anxiolytic (benzodiazepine) such as valium (diazepam) or ativan  (lorazepam). Do not drink alcohol, drive, or operate heavy machinery while taking either narcotic or benzodiazepine medications. All narcotics and benzodiazepines have a risk of dependence, so please use with caution. If you were prescribed Vicodin, Norco, or Percocet, do not take additional products containing Tylenol (acetaminophen), as this can cause an acetaminophen overdose and can damage your liver. Do not take more than 4073m of acetaminophen daily.    You may also have been prescribed an anti-inflammatory pain medication (NSAID) such as ibuprofen (Motrin, Advil) or naproxen (Aleve). Take the NSAID as prescribed, with food or milk.  Stop taking the NSAID if you develop abdominal pain, vomiting blood or dark/tarry or bloody stools. Be sure to drink plenty of fluids, at least 1-2 liters of water per day while taking an NSAID unless you have congestive heart failure or other condition that requires you to limit your water intake.    You may fill these prescriptions at a pharmacy of your choice. Please take these as directed.    [place] Services    [facility]  [address] [place](will [place] hours)  [address] 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  You need to follow up with your primary care doctor and psychologist as soon as possible.

## 2015-12-14 NOTE — ED Nursing Note (Signed)
Patient came in for SI. Patient doesn't trust anyone around. Pt claims he did meth. Pt in NAD. Resting in bed. Calm and Cooperative. Will continue to monitor.

## 2015-12-14 NOTE — Allied Health Progress (Signed)
CLINICAL SOCIAL SERVICES   BRIEF INTERVENTION  Note Date & Time: 12/14/2015    10:27  Date of Admission: 12/14/2015  1:35 AM    Date of Service: 12/14/2015 Referred By: medical team   Patient Name: Carl Mata Ethnicity: The patient's reported ethnicity is , and he identifies as Not Hispanic or Latino.   DOB: 29-Mar-1976  Age: [age]yr      INTERVENTION  Patient was referred for consultation with the ER AOD Counselor Tommie Trevino - unfortunately Carl Mata is not on duty today and not expected to return until Monday December 17, 2015 at 0830. Patient was evaluated by the NOC shift PES staff - patient not on 5150 hold.  Patient reportedly is homeless and has been using drugs consistently for the past three weeks.  Patient was provided with information about the [facility] Respite program which he will be encouraged to utilize.  Patient can return on Monday to see the ER AOD counselor to obtain additional information about drug rehab programs.  Patient reports he doesn't think [facility] respite will help him so he stated, "Ill just do it on my own; Get me my discharge papers so I can get out of here."  Informed RN and MD of patients plan for discharge.    DISPO/PLAN  Patient has been medically cleared for discharge from the ER    Signature: Sarita Haveramela C. Kathan Kirker, LCSW    Pager #:[phone]  Licensed Clinical Child psychotherapistocial Worker

## 2015-12-14 NOTE — ED Nursing Note (Signed)
Pt provided w/ additional breakfast tray, NAD noted. Pt calm and cooperative.

## 2015-12-17 ENCOUNTER — Emergency Department
Admission: EM | Admit: 2015-12-17 | Discharge: 2015-12-18 | Disposition: A | Discharge status: Home / Self Care | Payer: MEDICAID | Attending: Emergency Medicine | Admitting: Emergency Medicine

## 2015-12-17 DIAGNOSIS — E860 Dehydration: Principal | ICD-10-CM | POA: Insufficient documentation

## 2015-12-17 DIAGNOSIS — F1510 Other stimulant abuse, uncomplicated: Secondary | ICD-10-CM | POA: Insufficient documentation

## 2015-12-17 DIAGNOSIS — R42 Dizziness and giddiness: Secondary | ICD-10-CM

## 2015-12-17 DIAGNOSIS — R000 Tachycardia, unspecified: Secondary | ICD-10-CM

## 2015-12-17 LAB — CBC WITH DIFFERENTIAL
BASOPHILS % AUTO: 0.9 %
BASOPHILS ABS AUTO: 0.1 10*3/uL (ref 0–0.2)
EOSINOPHIL % AUTO: 1.4 %
EOSINOPHIL ABS AUTO: 0.1 10*3/uL (ref 0–0.5)
HEMATOCRIT: 45.9 % (ref 40–52)
HEMOGLOBIN: 16 g/dL (ref 14.0–18.0)
LYMPHOCYTE ABS AUTO: 2 10*3/uL (ref 1.0–4.8)
LYMPHOCYTES % AUTO: 20.9 %
MCH: 32.8 pg (ref 27–33)
MCHC: 34.8 % (ref 32–36)
MCV: 94.2 UM3 (ref 80–100)
MONOCYTES % AUTO: 7 %
MONOCYTES ABS AUTO: 0.7 10*3/uL (ref 0.1–0.8)
MPV: 7.7 UM3 (ref 6.8–10.0)
NEUTROPHIL ABS AUTO: 6.5 10*3/uL (ref 1.80–7.70)
NEUTROPHILS % AUTO: 69.8 %
PLATELET COUNT: 311 10*3/uL (ref 130–400)
RDW: 13.9 U (ref 0–14.7)
RED CELL COUNT: 4.87 10*6/uL (ref 4.5–5.9)
WHITE BLOOD CELL COUNT: 9.3 10*3/uL (ref 4.5–11.0)

## 2015-12-17 LAB — TROPONIN I

## 2015-12-17 NOTE — ED Triage Note (Signed)
Pt states feels like head is going to explode, dizziness, nausea, decreased PO intake. States he is not hydrating adequately.  Pt states that he has been forgetting to eat because his imagination is running wild. Admits to paranoia.  Denies suicidal ideation. Denies homicidal ideation.  VSS.   GCS 15.       Past Medical History:   Diagnosis Date    Bipolar 1 disorder     Schizoaffective disorder     Unspecified asthma(493.90)     Unspecified mental or behavioral problem     bipolar       Past Surgical History:   Procedure Laterality Date    FUSION, SPINE, THORACIC, USING POSTERIOR TECHNIQUE N/A 04/04/2013    Procedure: FUSION THORACIC POSTERIOR;  Surgeon: Fernande BrasKee D Kim, MD;  [facility];  Service: Neurosurgery    LAMINECTOMY, THORACIC, POSTERIOR APPROACH N/A 04/04/2013    Procedure: LAMINECTOMY THORACIC POSTERIOR;  Surgeon: Fernande BrasKee D Kim, MD;  [facility];  Service: Neurosurgery

## 2015-12-18 LAB — BASIC METABOLIC PANEL
CALCIUM: 9.2 mg/dL (ref 8.6–10.5)
CARBON DIOXIDE TOTAL: 26 meq/L (ref 24–32)
CHLORIDE: 105 meq/L (ref 95–110)
CREATININE BLOOD: 1.15 mg/dL (ref 0.44–1.27)
GLUCOSE: 99 mg/dL (ref 70–99)
POTASSIUM: 4.6 meq/L (ref 3.3–5.0)
SODIUM: 139 meq/L (ref 135–145)
UREA NITROGEN, BLOOD (BUN): 15 mg/dL (ref 8–22)

## 2015-12-18 LAB — TROPONIN I

## 2015-12-18 LAB — SED RATE WESTERGREN
SED RATE WESTERGREN: 5 mm/h (ref <=15)
SED RATE WESTERGREN: 8 mm/h (ref <=15)

## 2015-12-18 LAB — URINALYSIS-COMPLETE
BILIRUBIN URINE: NEGATIVE
GLUCOSE URINE: NEGATIVE mg/dL
KETONES: NEGATIVE mg/dL
LEUK. ESTERASE: NEGATIVE
NITRITE URINE: NEGATIVE
OCCULT BLOOD URINE: NEGATIVE mg/dL
PH URINE: 6 (ref 4.8–7.8)
RBC: 14 /HPF — AB (ref 0–5)
SPECIFIC GRAVITY: 1.032 — AB (ref 1.002–1.030)
UROBILINOGEN.: NEGATIVE mg/dL (ref ?–2.0)
WBC: 1 /HPF (ref 0–5)

## 2015-12-18 LAB — LACTIC ACID, WHOLE BLD VENOUS: LACTIC ACID, WHOLE BLD VENOUS: 0.9 mmol/L (ref 0.9–1.7)

## 2015-12-18 LAB — C-REACTIVE PROTEIN
C-REACTIVE PROTEIN: 0.3 mg/dL (ref 0–0.8)
C-REACTIVE PROTEIN: 0.3 mg/dL (ref 0–0.8)

## 2015-12-18 LAB — CREATINE KINASE: CREATINE KINASE: 119 U/L (ref 0–250)

## 2015-12-18 MED ORDER — LORAZEPAM 1 MG TABLET
2.0000 mg | ORAL_TABLET | Freq: Once | ORAL | Status: AC
Start: 2015-12-18 — End: 2015-12-18
  Administered 2015-12-18: 2 mg via ORAL
  Filled 2015-12-18: qty 2

## 2015-12-18 MED ORDER — ACETAMINOPHEN 500 MG TABLET
1000.0000 mg | ORAL_TABLET | Freq: Once | ORAL | Status: AC
Start: 2015-12-18 — End: 2015-12-18
  Administered 2015-12-18: 1000 mg via ORAL
  Filled 2015-12-18: qty 2

## 2015-12-18 MED ORDER — NACL 0.9% IV BOLUS - DURATION REQ
1000.0000 mL | Freq: Once | INTRAVENOUS | Status: AC
Start: 2015-12-18 — End: 2015-12-18
  Administered 2015-12-18: 1000 mL via INTRAVENOUS

## 2015-12-18 MED ORDER — METOCLOPRAMIDE 5 MG/ML INJECTION SOLUTION
10.0000 mg | Freq: Once | INTRAMUSCULAR | Status: AC
Start: 2015-12-18 — End: 2015-12-18
  Administered 2015-12-18: 10 mg via INTRAVENOUS
  Filled 2015-12-18: qty 2

## 2015-12-18 MED ORDER — ONDANSETRON HCL (PF) 4 MG/2 ML INJECTION SOLUTION
4.0000 mg | Freq: Once | INTRAMUSCULAR | Status: AC
Start: 2015-12-18 — End: 2015-12-18
  Administered 2015-12-18: 4 mg via INTRAVENOUS
  Filled 2015-12-18: qty 2

## 2015-12-18 NOTE — ED Nursing Note (Signed)
Pt sleeping in triage, easily arouses to voice.  Ambulatory for reassessment. Continues to c/o headache, 7/10.  Otherwise appears calm, cooperative. Tachy 120's, bp stable. States he is here for "meth overdose."

## 2015-12-18 NOTE — ED Nursing Note (Signed)
Patient discharged from ED with AVS, Rx, related instructions and all belongings. Patient is in NAD, is awake/alert skin, is pink/warm/dry. Pt leaves the ED ambulatory with self.

## 2015-12-18 NOTE — ED Nursing Note (Signed)
Patient has been hearing voices not drinking water. Pt denies any SI. Patient would like to seek help to get him off the meth. Pt in NAD. Will continue to monitor.

## 2015-12-18 NOTE — ED Initial Note (Signed)
EMERGENCY DEPARTMENT PHYSICIAN NOTE - Carl Mata       Date of Service:   12/17/2015  9:27 PM Patient's PCP: No Pcp No Pcp (Inactive)   Note Started: 12/18/2015 01:04 DOB: June 07, 1976             Chief Complaint   Patient presents with    Dehydration           The history provided by the patient.  Interpreter used: No    Carl Mata is a [age]yr old male, with a past medical history significant for bipolar 1 disorder, schizoaffective, meth use, who presents to the ED with a chief complaint of "meth overdose". Patient states he has been doing meth for several weeks, last was earlier this afternoon. Denies IVDU. He reports he has had ongoing headache, blurred vision, abdominal pain, diarrhea, and nausea over the past few weeks. Symptoms have been constant and gradually worsening. States he is now having difficulty walking prompting his ED visit. No vomiting, fevers, or chills.    A full history, including pertinent past medical and social history was reviewed.    HISTORY:  There are no active hospital problems to display for this patient.   Allergies   Allergen Reactions    Eggs [Egg] Hives    Nsaids (Non-Steroidal Anti-Inflammatory Drug) Anaphylaxis      Past Medical History:  No date: Bipolar 1 disorder  No date: Schizoaffective disorder  No date: Unspecified asthma(493.90)  No date: Unspecified mental or behavioral problem Past Surgical History:  04/04/2013: Fusion, spine, thoracic, using posterior techn* N/A  04/04/2013: Laminectomy, thoracic, posterior approach N/A   Social History    Marital status: MARRIED             Spouse name:                       Years of education:                 Number of children:               Occupational History    None on file    Social History Main Topics    Smoking status: Current Every Day Smoker                                                     Packs/day: 0.50      Years: 15.00          Types: Cigarettes       Smokeless status: Not on file                       Alcohol  use: Yes                Comment: 4-5 beers a day    Drug use: Yes                Special: Methamphetamine       Comment: active meth use    Sexual activity: Not on file          Other Topics            Concern    None on file    Social History Narrative    As of 04/07/2013:  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 Reportedly on parole in [place]. Used to work as a Designer, fashion/clothing.    Lives in [place], currently "on the [address]," has a girlfriend but does not live with her. Doesn't know where he will live next. Says his GF's worker is looking into this.    H/o alcohol dependence (reports being sober for 1 year), + tobacco, + drug use (meth; reports last use >1 year ago).    ---------------------------     Review of patient's family history indicates:    No FH spine problems [OTHER]   Other                              Review of Systems   Constitutional: Negative for chills and fever.   Eyes: Positive for visual disturbance.   Respiratory: Negative for cough and shortness of breath.    Cardiovascular: Negative for chest pain.   Gastrointestinal: Positive for abdominal pain and nausea. Negative for vomiting.   Musculoskeletal: Positive for gait problem.   Neurological: Positive for headaches.   All other systems reviewed and are negative.      TRIAGE VITAL SIGNS:  Temp: 36.6 C (97.9 F) (12/17/15 2140)  Temp src: Oral (12/18/15 0030)  Pulse: 112 (12/17/15 2140)  BP: 141/88 (12/17/15 2140)  Resp: 16 (12/17/15 2140)  SpO2: 99 % (12/17/15 2140)  Weight: 61.1 kg (134 lb 11.2 oz) (12/17/15 2140)    Physical Exam   Constitutional: He is oriented to person, place, and time.   HENT:   Head: Normocephalic and atraumatic.   Neck: Normal range of motion. Neck supple.   No C spinal tenderness   Cardiovascular: Regular rhythm.  Tachycardia present.    Pulmonary/Chest: Effort normal. No respiratory distress.   Abdominal: Soft. There is tenderness (RLQ, LLQ, suprapubic).   Musculoskeletal:    Old surgical scar to T spine with tenderness  No L spinal tenderness   Neurological: He is alert and oriented to person, place, and time.   Skin: Skin is warm and dry.   Nursing note and vitals reviewed.        INITIAL ASSESSMENT & PLAN, MEDICAL DECISION MAKING, ED COURSE  Carl Mata is a 35yr male who presents with a chief complaint of meth use.     Differential includes, but is not limited to: meth intoxication, dehydration, doubt intracranial hemorrhage, surgical abdomen.      The results of the ED evaluation were notable for the following:    Pertinent lab results:   CBC: unremarkable   BMP: unremarkable   CRP: WNL  SRW: WNL  CK: WNL  Trop: negative x2  UA: unremarkable   Last Point of Care Test Results        EKG (reviewed and interpreted independently by me): The rhythm is sinus tachycardia, rate 106, axis normal, intervals normal, no ST/T changes, unchanged from prior.     Pertinent medications:   Medications   NaCl 0.9% Bolus 1,000 mL (0 mL IV Stopped 12/18/15 0154)   Ondansetron (ZOFRAN) Injection 4 mg (4 mg IV Given 12/18/15 0121)   Acetaminophen (TYLENOL) Tablet 1,000 mg (1,000 mg ORAL Given 12/18/15 0153)   Lorazepam (ATIVAN) Tablet 2 mg (2 mg ORAL Given 12/18/15 0153)   NaCl 0.9% Bolus 1,000 mL (1,000 mL IV New bag/syringe 12/18/15 0255)   Metoclopramide (REGLAN) Injection 10 mg (10 mg IV Given 12/18/15  0300)          Chart Review: I reviewed the patient's prior medical records. Pertinent information that is relevant to this encounter includes recent encounter for meth use on 12/14/15.      Patient Summary: Carl Mata is a 40yr old male who presents to ED with multiple complaints 2/2 meth use. Labs WNL. EKG reassuring. Patient initially tachycardic, resolved after IVF and Ativan. Patient endorses improvement in headache after Reglan. PO trialed and ambulated prior to discharge. All questions answered and return precautions discussed.       LAST VITAL SIGNS:  Temp: 36.7 C (98.1 F) (12/18/15  0030)  Temp src: Oral (12/18/15 0030)  Pulse: 78 (12/18/15 0301)  BP: 123/84 (12/18/15 0301)  Resp: 20 (12/18/15 0301)  SpO2: 99 % (12/18/15 0301)  Weight: 61.1 kg (134 lb 11.2 oz) (12/17/15 2140)      Clinical Impression:     ICD-10-CM    1. 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 Dehydration  3. Resolved abdominal pain and headache    Disposition: Discharge. Follow up with PCP. ED discharge instructions were reviewed and provided.      PATIENT'S GENERAL CONDITION:  Good: Vital signs are stable and within normal limits. Patient is conscious and comfortable. Indicators are excellent.   Temp: 36.7 C (98.1 F) (07/11 0030)  Temp src: Oral (07/11 0030)  Pulse: 84 (07/11 0401)  BP: 114/70 (07/11 0401)  Resp: 20 (07/11 0401)  SpO2: 98 % (07/11 0401)  Height: --  Weight: 61.1 kg (134 lb 11.2 oz) (07/10 2140)    SCRIBE STATEMENT  I, Alex Hencken, SCRIBE,  am personally taking down the notes in the presence of Dr. Vickki Hearing Olafsdottir-Gardali.  Electronically signed by - Rainey Pines, SCRIBE, Scribe  12/18/2015  01:04      SCRIBE DISCLAIMER   I, Oralia Rud, MD, personally performed the services described in this documentation, as scribed by the trained medical scribe above in my presence, and it is both accurate and complete.     Electronically signed by: Oralia Rud, MD, Resident      This patient was seen, evaluated, and care plan was developed with the resident.  I agree with the findings and plan as outlined in our combined note. I personally independently visualized the images and tracings as noted above.      Hal Hope, MD      Electronically signed by: Hal Hope, MD, Attending Physician

## 2015-12-18 NOTE — ED Nursing Note (Signed)
MD Olafsdottir at bedside to assess. Pt in no acute distress.

## 2015-12-18 NOTE — Discharge Instructions (Signed)
Thank you for choosing [facility] for your emergency health care needs. It has been our privilege to take care of you today. Your primary complaints have been evaluated based on your history, lab tests, imaging tests and you have been treated for your symptoms and discharged home. Please take all medicines that are prescribed to you as directed (see below).  It is crucial, if you have a primary care physician, to follow up with him or her in the time frame recommended as many health conditions that seem self-limited initially may actually worsen over time.  If you do not have a primary care physician, we will outline the various resources available for you to find one.    If at any time you feel that your condition is worsening, call your doctor or return to the Emergency Department for reevaluation. CALL 911 IF YOU THINK YOU ARE HAVING A MEDICAL EMERGENCY. Return to the Emergency Department if you are unable to obtain the recommended follow-up treatment or you are not better as expected. You can call the [facility] with questions at [phone].    Please realize that the results of some studies that you had done during your stay with [place] (such as x-rays and cultures) have only preliminarily results at this time.  Results of these studies may change as more information becomes available or as the studies are re-evaluated by other members of our health care team in the next few days. We will attempt to contact you with any important changes or additions to the studies that were obtained today, particularly if any of these results require a change in your treatment.    GENERAL PAIN MEDICATION PRECAUTIONS    You may have been prescribed medications for pain today. Some of these may contain a narcotic (Vicodin, Norco, Percocet, etc.) Take these pain medications as prescribed. You also may have been prescribed a muscle relaxant or anxiolytic (benzodiazepine) such as valium (diazepam) or ativan  (lorazepam). Do not drink alcohol, drive, or operate heavy machinery while taking either narcotic or benzodiazepine medications. All narcotics and benzodiazepines have a risk of dependence, so please use with caution. If you were prescribed Vicodin, Norco, or Percocet, do not take additional products containing Tylenol (acetaminophen), as this can cause an acetaminophen overdose and can damage your liver. Do not take more than 4073m of acetaminophen daily.    You may also have been prescribed an anti-inflammatory pain medication (NSAID) such as ibuprofen (Motrin, Advil) or naproxen (Aleve). Take the NSAID as prescribed, with food or milk.  Stop taking the NSAID if you develop abdominal pain, vomiting blood or dark/tarry or bloody stools. Be sure to drink plenty of fluids, at least 1-2 liters of water per day while taking an NSAID unless you have congestive heart failure or other condition that requires you to limit your water intake.    You may fill these prescriptions at a pharmacy of your choice. Please take these as directed.    [place] [facility]    [facility]  [address] [place]  [address] [facility]  [phone]   [place]      [phone] (until 11pm)    [place] [address], [place] [phone] [facility] [address],  [place] [phone] (Sunday 8a-2p)    You were seen for headache and not feeling well  You were diagnosed with methamphetamine abuse  Your labs and EKG were all normal. You were treated with iv fluids, pain and nausea medication  You need to return to the ED for worsening symptoms  You need to follow up with your primary care doctor in 2 days

## 2015-12-18 NOTE — ED Nursing Note (Signed)
Pt ate a sandwich and drank some water. Ambulated with steady gait.

## 2015-12-19 LAB — ELECTROCARDIOGRAM WITH RHYTHM STRIP: QTC: 438

## 2015-12-22 DIAGNOSIS — R51 Headache: Principal | ICD-10-CM | POA: Insufficient documentation
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
## 2015-12-23 ENCOUNTER — Emergency Department (EMERGENCY_DEPARTMENT_HOSPITAL): Payer: MEDICAID

## 2015-12-23 ENCOUNTER — Emergency Department
Admission: EM | Admit: 2015-12-23 | Discharge: 2015-12-23 | Disposition: A | Discharge status: Home / Self Care | Payer: MEDICAID | Attending: Emergency Medicine | Admitting: Emergency Medicine

## 2015-12-23 DIAGNOSIS — R51 Headache: Secondary | ICD-10-CM

## 2015-12-23 DIAGNOSIS — M5030 Other cervical disc degeneration, unspecified cervical region: Secondary | ICD-10-CM

## 2015-12-23 DIAGNOSIS — M542 Cervicalgia: Secondary | ICD-10-CM

## 2015-12-23 DIAGNOSIS — R519 Headache, unspecified: Secondary | ICD-10-CM | POA: Insufficient documentation

## 2015-12-23 LAB — UR DRUGS OF ABUSE SCREEN
AMPHETAMINE SCREEN, URINE: POSITIVE ng/mL — AB
BARBITURATES SCREEN, URINE: NEGATIVE ng/mL
BENZODIAZEPINES SCREEN, URINE: NEGATIVE ng/mL
COCAINE METABOLITE SCRN, URINE: NEGATIVE ng/mL
OPIATES SCREEN, URINE: NEGATIVE ng/mL

## 2015-12-23 LAB — CBC WITH DIFFERENTIAL
BASOPHILS % AUTO: 1.3 %
BASOPHILS ABS AUTO: 0.1 10*3/uL (ref 0–0.2)
EOSINOPHIL % AUTO: 3.4 %
EOSINOPHIL ABS AUTO: 0.2 10*3/uL (ref 0–0.5)
HEMATOCRIT: 40.2 % (ref 40–52)
HEMOGLOBIN: 14.2 g/dL (ref 14.0–18.0)
LYMPHOCYTE ABS AUTO: 2.2 10*3/uL (ref 1.0–4.8)
LYMPHOCYTES % AUTO: 30.5 %
MCH: 33.4 pg — AB (ref 27–33)
MCHC: 35.4 % (ref 32–36)
MCV: 94.3 UM3 (ref 80–100)
MONOCYTES % AUTO: 8 %
MONOCYTES ABS AUTO: 0.6 10*3/uL (ref 0.1–0.8)
MPV: 7.8 UM3 (ref 6.8–10.0)
NEUTROPHIL ABS AUTO: 4 10*3/uL (ref 1.80–7.70)
NEUTROPHILS % AUTO: 56.8 %
PLATELET COUNT: 257 10*3/uL (ref 130–400)
RDW: 13.9 U (ref 0–14.7)
RED CELL COUNT: 4.26 10*6/uL — AB (ref 4.5–5.9)
WHITE BLOOD CELL COUNT: 7.1 10*3/uL (ref 4.5–11.0)

## 2015-12-23 LAB — ETHANOL, PLASMA: ETHANOL, PLASMA: NEGATIVE mg/dL

## 2015-12-23 LAB — BASIC METABOLIC PANEL
CALCIUM: 9 mg/dL (ref 8.6–10.5)
CARBON DIOXIDE TOTAL: 27 meq/L (ref 24–32)
CHLORIDE: 108 meq/L (ref 95–110)
CREATININE BLOOD: 1.01 mg/dL (ref 0.44–1.27)
GLUCOSE: 103 mg/dL — AB (ref 70–99)
POTASSIUM: 3.8 meq/L (ref 3.3–5.0)
SODIUM: 141 meq/L (ref 135–145)
UREA NITROGEN, BLOOD (BUN): 18 mg/dL (ref 8–22)

## 2015-12-23 LAB — HEPATIC FUNCTION PANEL
ALANINE TRANSFERASE (ALT): 18 U/L (ref 6–63)
ALBUMIN: 3.7 g/dL (ref 3.4–4.8)
ALKALINE PHOSPHATASE (ALP): 74 U/L (ref 35–115)
ASPARTATE TRANSAMINASE (AST): 23 U/L (ref 15–43)
BILIRUBIN DIRECT: 0.2 mg/dL (ref 0.0–0.2)
BILIRUBIN TOTAL: 1 mg/dL (ref 0.3–1.3)
PROTEIN: 6.1 g/dL — AB (ref 6.3–8.3)

## 2015-12-23 LAB — LIPASE: LIPASE: 33 U/L (ref 13–51)

## 2015-12-23 MED ORDER — NACL 0.9% IV BOLUS - DURATION REQ
1000.0000 mL | Freq: Once | INTRAVENOUS | Status: AC
Start: 2015-12-23 — End: 2015-12-23
  Administered 2015-12-23: 1000 mL via INTRAVENOUS

## 2015-12-23 MED ORDER — LORAZEPAM 2 MG/ML INJECTION SOLUTION
0.5000 mg | Freq: Once | INTRAMUSCULAR | Status: AC
Start: 2015-12-23 — End: 2015-12-23
  Administered 2015-12-23: 0.5 mg via INTRAVENOUS
  Filled 2015-12-23: qty 1

## 2015-12-23 MED ORDER — ACETAMINOPHEN 325 MG TABLET
650.0000 mg | ORAL_TABLET | Freq: Once | ORAL | Status: AC
Start: 2015-12-23 — End: 2015-12-23
  Administered 2015-12-23: 650 mg via ORAL
  Filled 2015-12-23: qty 2

## 2015-12-23 MED ORDER — PROCHLORPERAZINE EDISYLATE 10 MG/2 ML (5 MG/ML) INJECTION SOLUTION
5.0000 mg | Freq: Once | INTRAMUSCULAR | Status: AC
Start: 2015-12-23 — End: 2015-12-23
  Administered 2015-12-23: 10 mg via INTRAVENOUS
  Filled 2015-12-23: qty 2

## 2015-12-23 NOTE — ED Nursing Note (Signed)
Assumed care of pt, entered room to find pt laying on stomach, avoids eye contact w/ RN. Pt states he woke up "normal" this morning and he "doesn't know what happened but I'm having trouble remembering stuff, my vision is weird like looking through blurry glass, my legs have numbness and tingling". Pt a&ox4, no slurred speech, facial droop, resp even/unlabored. Awaiting MD for eval

## 2015-12-23 NOTE — Discharge Instructions (Signed)
Thank you for choosing [facility] for your emergency health care needs. It has been our privilege to take care of you today. Your primary complaints have been evaluated based on your history, lab tests, imaging tests and you have been treated for your symptoms and discharged home. Please take all medicines that are prescribed to you as directed (see below).  It is crucial, if you have a primary care physician, to follow up with him or her in the time frame recommended as many health conditions that seem self-limited initially may actually worsen over time.  If you do not have a primary care physician, we will outline the various resources available for you to find one.    If at any time you feel that your condition is worsening, call your doctor or return to the Emergency Department for reevaluation. CALL 911 IF YOU THINK YOU ARE HAVING A MEDICAL EMERGENCY. Return to the Emergency Department if you are unable to obtain the recommended follow-up treatment or you are not better as expected. You can call the [facility] with questions at [phone].    Please realize that the results of some studies that you had done during your stay with [place] (such as x-rays and cultures) have only preliminarily results at this time.  Results of these studies may change as more information becomes available or as the studies are re-evaluated by other members of our health care team in the next few days. We will attempt to contact you with any important changes or additions to the studies that were obtained today, particularly if any of these results require a change in your treatment.    GENERAL PAIN MEDICATION PRECAUTIONS    You may have been prescribed medications for pain today. Some of these may contain a narcotic (Vicodin, Norco, Percocet, etc.) Take these pain medications as prescribed. You also may have been prescribed a muscle relaxant or anxiolytic (benzodiazepine) such as valium (diazepam) or ativan  (lorazepam). Do not drink alcohol, drive, or operate heavy machinery while taking either narcotic or benzodiazepine medications. All narcotics and benzodiazepines have a risk of dependence, so please use with caution. If you were prescribed Vicodin, Norco, or Percocet, do not take additional products containing Tylenol (acetaminophen), as this can cause an acetaminophen overdose and can damage your liver. Do not take more than 4073m of acetaminophen daily.    You may also have been prescribed an anti-inflammatory pain medication (NSAID) such as ibuprofen (Motrin, Advil) or naproxen (Aleve). Take the NSAID as prescribed, with food or milk.  Stop taking the NSAID if you develop abdominal pain, vomiting blood or dark/tarry or bloody stools. Be sure to drink plenty of fluids, at least 1-2 liters of water per day while taking an NSAID unless you have congestive heart failure or other condition that requires you to limit your water intake.    You may fill these prescriptions at a pharmacy of your choice. Please take these as directed.    [place] [facility]    [facility]  [address] [place])  [address] [facility]  [phone]   [place]      [phone] (until 11pm)    [place] [address], [place] [phone] [facility] [address],  [place] [phone] (Sunday 8a-2p)    You were seen for your headache. We obtained a CT scan of your head and labs which were all not revealing of emergent causes of your symptoms at this time. Please come back to the ER immediately if your symptoms acutely worsen. Follow up with your primary doctor as soon as possible to ensure that your symptoms are improving.

## 2015-12-23 NOTE — ED Nursing Note (Addendum)
Pt snoring on gurney, Dr Juanetta GoslingHawkins to bedside w/ d/c instructions, RN made mult attempts to wake pt, pt snoring and would wake up and fall back asleep. Pt finally woke and pt angry when RN removed IV stating "I just need to sleep." Pt refused to sign d/c paperwork and told RN "you can shred this" and threw d/c paperwork off gurney. Patient discharged from ED with and all belongings. Patient is in NAD, is awake/alert skin, is pink/warm/dry. Pt leaves the ED ambulatory with self.

## 2015-12-23 NOTE — ED Nursing Note (Signed)
No Answer" note: The patient was called using the ED PA system. The patient was not found after a search of the ED Waiting Room Areas.
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
## 2015-12-24 ENCOUNTER — Emergency Department
Admission: EM | Admit: 2015-12-24 | Discharge: 2015-12-24 | Disposition: A | Discharge status: Home / Self Care | Payer: MEDICAID | Attending: Emergency Medicine | Admitting: Emergency Medicine

## 2015-12-24 DIAGNOSIS — F39 Unspecified mood [affective] disorder: Secondary | ICD-10-CM | POA: Insufficient documentation

## 2015-12-24 DIAGNOSIS — F28 Other psychotic disorder not due to a substance or known physiological condition: Principal | ICD-10-CM | POA: Insufficient documentation

## 2015-12-24 DIAGNOSIS — F19951 Other psychoactive substance use, unspecified with psychoactive substance-induced psychotic disorder with hallucinations: Secondary | ICD-10-CM | POA: Insufficient documentation

## 2015-12-24 DIAGNOSIS — F1510 Other stimulant abuse, uncomplicated: Secondary | ICD-10-CM | POA: Insufficient documentation

## 2015-12-24 DIAGNOSIS — F250 Schizoaffective disorder, bipolar type: Secondary | ICD-10-CM | POA: Insufficient documentation

## 2015-12-24 MED ORDER — ACETAMINOPHEN 325 MG TABLET
650.0000 mg | ORAL_TABLET | Freq: Once | ORAL | Status: AC
Start: 2015-12-24 — End: 2015-12-24
  Administered 2015-12-24: 650 mg via ORAL
  Filled 2015-12-24: qty 2

## 2015-12-24 NOTE — ED Nursing Note (Signed)
Patient AAOx4, speech clear and oriented, ABCs intact upon discharge. Information regarding discharge reviewed with patient by ED provider. No PIV inserted during today's ED visit. Discharge paper signed by patient. Means of transportation out of ED: ambulated, accompanied by self. All personal items with patient.

## 2015-12-24 NOTE — ED Nursing Note (Signed)
Patient noted to have left ED without last set of VS reassessment.

## 2015-12-24 NOTE — ED Triage Note (Signed)
Ambulatory with EMS, reports of ongoing headaches, has been mumbling since triage started. He states "they're after me, I have to keep watching my back, I keep hearing all these things, trying to yell at them to stop but they keep coming." States he has had a headache with nausea and vomiting. Denies any SI or HI.

## 2015-12-24 NOTE — Allied Health Progress (Signed)
CLINICAL SOCIAL SERVICES   CRISIS SERVICES PROGRESS NOTE  Note Date and Time: 12/24/2015    03:10  Date of Admission: 12/24/2015 12:24 AM    Patient Name: Carl Mata Referral Source: medical staff    DOB: 1975-12-12  Age: 89yr     Pt does not meet with criteria for a psychiatric hold.  Medical staff updated.  Full assessment to follow.        Signature: Emilio MathDuane Nai Borromeo, [place]       Pager # [phone]  Licensed Clinical Social Worker

## 2015-12-24 NOTE — Allied Health Progress (Signed)
CLINICAL SOCIAL SERVICES   CRISIS SERVICES PROGRESS NOTE  Note Date and Time: 12/24/2015    02:45  Date of Admission: 12/24/2015 12:24 AM    Patient Name: Carl Mata Referral Source: Medical Staff    DOB: 04-08-1976  Age: [age]yr     Psychiatric referral made to PES for a/h, s/i and chronic and current methamphetamine use.     Pt was pt was in the ED six times since June 24th and two times in last 24 hours.       Signature: Emilio MathDuane Loyalty Arentz, [place]   Pager # [phone]  Licensed Clinical Social Worker

## 2015-12-24 NOTE — ED Nursing Note (Signed)
Assumed care; received lying on gurney with both eyes closed. Opens eyes spontaneously, speech clear and oriented. Endorses ongoing HA, (+) auditory hallucinations - per patient voices instructing to hurt himself. Endorses SI - unclear plan; denies HI. Endorses meth use; last taken "yesterday". Patient calm and cooperative at the moment; no personal items noted at bedside. Patient for PES eval. PO fluids and crackers provided - OK'd by ED provider.
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
## 2015-12-24 NOTE — ED Initial Note (Signed)
EMERGENCY DEPARTMENT PHYSICIAN NOTE - Carl Mata       Date of Service:   12/24/2015 12:24 AM Patient's PCP: No Pcp Per Patient   Note Started: 12/24/2015 02:31 DOB: 1975-06-17             Chief Complaint   Patient presents with    Delusional/Hallucinating, Active Non-threatening     The history provided by the patient.  Interpreter used: No    Carl Mata is a [age]yr old male, with a past medical history significant for schizoaffective disorder, bipolar 1 disorder, meth use, who presents to the ED with a chief complaint of auditory and visual hallucinations that began today. Patient was seen in ED yesterday for headache, was discharged. He admits to meth use yesterday. He is now having auditory hallucinations of voices screaming, voices are saying "they're out to get me." Endorses visual hallucinations, paranoid in nature, "they're coming out of the trees." Endorses SI and HI in this context, explaining harming self is the only way he sees out of the relentless hallucinosis.     Patient is not on any psychoactive medications.     A full history, including pertinent past medical and social history was unable to be completed due to pt mental health status.    HISTORY:  There are no active hospital problems to display for this patient.   Allergies   Allergen Reactions    Eggs [Egg] Hives    Nsaids (Non-Steroidal Anti-Inflammatory Drug) Anaphylaxis      Past Medical History:  No date: Bipolar 1 disorder  No date: Schizoaffective disorder  No date: Unspecified asthma(493.90)  No date: Unspecified mental or behavioral problem Past Surgical History:  04/04/2013: Fusion, spine, thoracic, using posterior techn* N/A  04/04/2013: Laminectomy, thoracic, posterior approach N/A   Social History    Marital status: MARRIED             Spouse name:                       Years of education:                 Number of children:               Occupational History    None on file    Social History Main Topics    Smoking status:  Current Every Day Smoker                                                     Packs/day: 0.50      Years: 15.00          Types: Cigarettes       Smokeless status: Not on file                       Alcohol use: Yes                Comment: 4-5 beers a day    Drug use: Yes                Special: Methamphetamine       Comment: active meth use    Sexual activity: Not on file          Other Topics  Concern    None on file    Social History Narrative    As of 04/07/2013:     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 Reportedly on parole in [place]. Used to work as a Designer, fashion/clothing.    Lives in [place], currently "on the street," has a girlfriend but does not live with her. Doesn't know where he will live next. Says his GF's worker is looking into this.    H/o alcohol dependence (reports being sober for 1 year), + tobacco, + drug use (meth; reports last use >1 year ago).    ---------------------------     Review of patient's family history indicates:    No FH spine problems [OTHER]   Other                        Review of Systems   Constitutional: Negative for fatigue and fever.   Neurological: Positive for headaches.   Psychiatric/Behavioral: Positive for agitation, hallucinations and suicidal ideas. The patient is nervous/anxious.      TRIAGE VITAL SIGNS:  Temp: 36.8 C (98.2 F) (12/24/15 0022)  Temp src: Oral (12/24/15 0022)  Pulse: 81 (12/24/15 0022)  BP: 150/82 (12/24/15 0022)  Resp: 18 (12/24/15 0022)  SpO2: 98 % (12/24/15 0022)  Weight: (not recorded)    Physical Exam   HENT:   Head: Normocephalic and atraumatic.   Mouth/Throat: Oropharynx is clear and moist. No oropharyngeal exudate.   Eyes: EOM are normal. Pupils are equal, round, and reactive to light.   Neck: Normal range of motion.   Cardiovascular: Normal rate, regular rhythm and intact distal pulses.  Exam reveals no gallop and no friction rub.    No murmur heard.  Pulmonary/Chest: Effort normal and breath  sounds normal. No respiratory distress. He has no wheezes. He has no rales. He exhibits no tenderness.   Abdominal: Soft. He exhibits no distension and no mass. There is tenderness. There is no rebound and no guarding.   Neurological: No cranial nerve deficit.   Skin: Skin is warm and dry. No rash noted. There is erythema. No pallor.   Nursing note and vitals reviewed.    INITIAL ASSESSMENT & PLAN, MEDICAL DECISION MAKING, ED COURSE  Carl Mata is a 13yr male who presents with a chief complaint of delusional hallucinosis.     Differential includes, but is not limited to: methamphetamine-induced psychosis, acute mania    The results of the ED evaluation were notable for the following:    Consults: A Consult was obtained from the [facility] service to evaluate for psychiatric admission. They recommend no admission.      Chart Review: I reviewed the patient's prior medical records. Pertinent information that is relevant to this encounter: patient was here last night and underwent workup including labs, which were not revealing of any acute etiology.    Patient Summary: Patient complains of auditory and visual paranoid delusional hallucinosis, including command hallucinations, as well as thoughts of harming self or others. No acute medical issues identified. Medically clear for disposition per our PES consultant, who feels he is safe for DC.    LAST VITAL SIGNS:  Temp: 36.8 C (98.2 F) (12/24/15 0022)  Temp src: Oral (12/24/15 0022)  Pulse: 81 (12/24/15 0022)  BP: 150/82 (12/24/15 0022)  Resp: 18 (12/24/15 0022)  SpO2: 98 % (12/24/15 0022)  Weight: (not recorded)    Clinical Impression: Paranoid delusional hallucinosis  Disposition: Discharge. Follow up with PCP. ED discharge instructions were reviewed and provided.    PATIENT'S GENERAL CONDITION:  Fair: Vital signs are stable and within normal limits. Patient is conscious but may be uncomfortable. Indicators are favorable.    Electronically signed by: Courtney Heys, MD, Resident    This patient was seen, evaluated, and care plan was developed with the resident.  I agree with the findings and plan as outlined in our combined note. I personally independently visualized the images and tracings as noted above.      Mosetta Putt, MD    Electronically signed by: Mosetta Putt, MD, Attending Physician

## 2015-12-24 NOTE — ED Nursing Note (Signed)
PES at bedside at the moment.

## 2015-12-24 NOTE — Discharge Instructions (Signed)
Thank you for choosing [facility] for your emergency health care needs. It has been our privilege to take care of you today. Your primary complaints have been evaluated based on your history, lab tests, imaging tests and you have been treated for your symptoms and discharged home. Please take all medicines that are prescribed to you as directed (see below).  It is crucial, if you have a primary care physician, to follow up with him or her in the time frame recommended as many health conditions that seem self-limited initially may actually worsen over time.  If you do not have a primary care physician, we will outline the various resources available for you to find one.    If at any time you feel that your condition is worsening, call your doctor or return to the Emergency Department for reevaluation. CALL 911 IF YOU THINK YOU ARE HAVING A MEDICAL EMERGENCY. Return to the Emergency Department if you are unable to obtain the recommended follow-up treatment or you are not better as expected. You can call the [facility] with questions at [phone].    Please realize that the results of some studies that you had done during your stay with [place] (such as x-rays and cultures) have only preliminarily results at this time.  Results of these studies may change as more information becomes available or as the studies are re-evaluated by other members of our health care team in the next few days. We will attempt to contact you with any important changes or additions to the studies that were obtained today, particularly if any of these results require a change in your treatment.    GENERAL PAIN MEDICATION PRECAUTIONS    You may have been prescribed medications for pain today. Some of these may contain a narcotic (Vicodin, Norco, Percocet, etc.) Take these pain medications as prescribed. You also may have been prescribed a muscle relaxant or anxiolytic (benzodiazepine) such as valium (diazepam) or ativan  (lorazepam). Do not drink alcohol, drive, or operate heavy machinery while taking either narcotic or benzodiazepine medications. All narcotics and benzodiazepines have a risk of dependence, so please use with caution. If you were prescribed Vicodin, Norco, or Percocet, do not take additional products containing Tylenol (acetaminophen), as this can cause an acetaminophen overdose and can damage your liver. Do not take more than 4000mg  of acetaminophen daily.    You may also have been prescribed an anti-inflammatory pain medication (NSAID) such as ibuprofen (Motrin, Advil) or naproxen (Aleve). Take the NSAID as prescribed, with food or milk.  Stop taking the NSAID if you develop abdominal pain, vomiting blood or dark/tarry or bloody stools. Be sure to drink plenty of fluids, at least 1-2 liters of water per day while taking an NSAID unless you have congestive heart failure or other condition that requires you to limit your water intake.      [place] 24 hour Pharmacy Services    [facility]  [address] [place] (will fill [place] after hours)  [address], [facility]   [phone]    [place]       [phone]  (until 11pm)    Acupuncturist for [facility] [place], [address] [place], [place]  [phone], [address] [place], [place] [phone] (Sunday 8a-2p)    (Last updated 06/09/10)  Alcoholics Anonymous Meetings at [facility]    Every Wednesday, 12-1pm  [place], Room 8716  [facility], [address]  Contact: Shalako Wengronowitz [phone]  * Open to everyone    Every Monday, 7:30-9pm  [facility] [address]  [address]  Contact: Carol Kirshnit [phone]  * Open to everyone    Every first Saturday, 6:30-10pm  [facility]  [address]  Contact: Carol Kirshnit [phone]  * Speaker only    For more information about meetings, you  may also contact Academic and Staff Assistance at [phone] or the main AA number [phone].    (Last updated 06/09/10)

## 2015-12-24 NOTE — ED Nursing Note (Signed)
Patient noted to have signed discharge papers; personal items returned back to patient. Noted to be irritable. Per ED provider patient not on hold per PES and will be discharged.

## 2015-12-26 ENCOUNTER — Emergency Department
Admission: EM | Admit: 2015-12-26 | Discharge: 2015-12-26 | Discharge status: Against Medical Advice | Payer: MEDICAID | Attending: Emergency Medicine | Admitting: Emergency Medicine

## 2015-12-26 DIAGNOSIS — Z659 Problem related to unspecified psychosocial circumstances: Principal | ICD-10-CM | POA: Insufficient documentation

## 2015-12-26 NOTE — ED Nursing Note (Signed)
Pt asked for a street sheet.   Pt was given a street sheet and left on his own accord.

## 2015-12-26 NOTE — Allied Health Progress (Addendum)
CLINICAL SOCIAL SERVICES   CRISIS SERVICES PROGRESS NOTE  Note Date and Time: 12/26/2015    02:56  Date of Admission: 12/26/2015  2:26 AM    Patient Name: Carl Mata Referral Source: Medical Staff    DOB: [birth year]  Age: [age]yr     Pt does not meet the criteria for a psychiatric hold.  Medical staff updated.  Full assessment to follow.      Signature: Emilio MathDuane Derren Suydam, [place]     Pager # [phone]  Licensed Clinical Social Worker

## 2015-12-26 NOTE — ED Triage Note (Signed)
Hallucinations. Paranoia, hearing voices. Feeling suicidal.

## 2015-12-26 NOTE — Allied Health Progress (Signed)
CLINICAL SOCIAL SERVICES   CRISIS SERVICES PSYCHIATRIC DIAGNOSTIC INTERVIEW  Note Date and Time: 12/26/2015    02:59  Date of Admission: 12/26/2015  2:26 AM    Date of Service: 12/26/2015  Referred By: medical team   Patient Name: Carl Mata Ethnicity: Caucasian    DOB: 09-18-1975  Age: 40yr     Referral Source:Emergency Department Attending: Hallway   Reason for Referral: Psychiatric evaluation  Interpreter Assisting with Interview: SLouanne Skye  Persons Interviewed: Pt, Chart Review, Medical Staff       HISTORY OF PRESENTING ILLNESS  Pt arrived on this own accord complaining, "I don't know what to do, all I do is associate with druggies" and "I am down to 122 lbs and I was at 160 lbs" and "if they [drug users] catch me at the peak of the right time then I am right back" and "I feel like everyone is out to get me" and "I get too far in my head with the chemical of the drug," and I need a place to help with paranoia, I don't trust anyone, I am not sleeping and wandering around all night."  Pt admitted to smoking methamphetamine yesterday and admitted he had been to other EDs recently [I suspect tonight] and been told  "I was not psychotic enough."  At no time did pt convey to this writer he was feeling homicidal or suicidal, instead he admitted "I have no where to go.  I don't feel safe out here.  I feel safe in the hospital."  Pt denies any mental heath history but [facility].  Per Avatar, pt does have an mental health hx (see mental health hx listed below).  Pt does not met the legal criteria for a psychiatric hold.      PAST PSYCHIATRIC HISTORY(include family)  This wProbation officercleared pt psychiatrically on 12/14/2015 after pt arrived in the ED with a very similar presentation.  Per Avatar, [facility]  Pts is on parole and may have been receiving mental health services from parole.  Pt  [facility] and [facility] the [facility] day  -Pt most recent dx was schizophrenia, paranoid type     PAST MEDICAL HISTORY  Pt advising this writer he has no physical health complaints.  See chart for additional.      HISTORY OF SUBSTANCE ABUSE  Pt advising he has used methamphetamine since age of 40      PSYCHOSOCIAL HISTORY  Marital Status: Single  Living Situation: homeless  Patient resides with: self   Patient's LPsychologist, sport and exercise(e.g. 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 who must register every 30 days   Patient's Contact Info:   [place] [address] Phone: [phone](home)    Family involved: no  Support system: none   Patient's education/occupation: dropped out of high school his sophomore year  Financial Support: [place]  Insurance: [employer] [employer]cies Involved: Parole     Patient's Perceptions Regarding Illness, Treatment, and Prognosis: no insight  Family's / CHospital doctorPerceptions Regarding Illness, Treatment, and Prognosis: unknown  MENTAL STATUS EXAM  Alert: yes  Oriented: Person, Place and Time  Appearance: appropriate  Memory: Long-term Intact:  yes  Behaviour: unremarkable  Attitude: suspicious  Eye Contact: fair  Affect: appropriate  Mood: anxious, concerned and preoccupied  Speech: appropriate  Ability to Communicate: good  Concentration: good  Comprehension: good  Insight: none  Judgement: impaired due to drug use   Decision Making Capacity: impaired due to drug use  Impulse Control: impaired due to drug use  Intellectual Functioning: normal  Thought Process: intact  Thought Content: hallucinations:  None highlighted or observed   Current Suicidal Ideation: no Plan: Pt reports no hx    Current Homicidal Ideation: no     Comments: When asked about s/a, pt agreed that he had never attempted to kill himself.  Pt is more worried about other people hurting him.       ASSESSMENT   Pt is a 40 year old Caucasian homeless male who this writer cleared psychiatrically on 12-14-15 then 12-24-15 and then again today with nearly identical presentation.  Since 12-01-15, pt has been to the [facility] ED 7 times with 5 of those visits in July which included visits the last three consecutive days.  Pt again complaining that he is homeless, paranoid and wanting respite.  Pt advising he [facility] recently but was told he could not return so soon.  Per medical staff, pt become extremely agitated when being discharged from the hospital stating where am I suppose to go.  Pts primary issues are due to a chronic and active drug problem.  While pt has a mental history, pt current mental health distress is clearly related to methamphetamine use.  Because pt does not meet the legal criteria for a psychiatric hold, pt can be discharged with medically clear.      Ability to cope with current situation: poor   Ability to utilize resources: poor  Willing to agree to plan for patient's safety: n/a-the hospitals are his safety plan   Adequate support system: no  Patient meets criteria for 51/50: no  [place] Issue: no     DSM V Diagnosis    Methaphetamine use-severe   Schizoaffective disorder (by history)  Chronic homelessness   Malingering      PLAN/DISPOSITION  Consulted with medical team: yes  Notified: Family &/or legal representative(s): unable to locate or not applicable  Mandated reporting required: No-pt made no indication he had been in the presence of children   Referrals Provided: None  Counseling Provided by [facility] Social Worker re: Substance abuse  Patient Disposition: Patient may be discharged when medically ready to:   Homeless     Signature: Alcide Evener, [place]       Pager # [phone]  Licensed Clinical Social Worker

## 2016-05-27 ENCOUNTER — Encounter (HOSPITAL_COMMUNITY): Payer: Self-pay

## 2016-05-27 ENCOUNTER — Emergency Department (HOSPITAL_COMMUNITY)
Admission: EM | Admit: 2016-05-27 | Discharge: 2016-05-28 | Disposition: A | Discharge status: Home / Self Care | Payer: Self-pay | Attending: Emergency Medicine | Admitting: Emergency Medicine

## 2016-05-27 ENCOUNTER — Emergency Department (HOSPITAL_COMMUNITY): Payer: Self-pay

## 2016-05-27 DIAGNOSIS — M5441 Lumbago with sciatica, right side: Secondary | ICD-10-CM | POA: Insufficient documentation

## 2016-05-27 DIAGNOSIS — M5442 Lumbago with sciatica, left side: Secondary | ICD-10-CM | POA: Insufficient documentation

## 2016-05-27 DIAGNOSIS — F17210 Nicotine dependence, cigarettes, uncomplicated: Secondary | ICD-10-CM | POA: Insufficient documentation

## 2016-05-27 DIAGNOSIS — R32 Unspecified urinary incontinence: Secondary | ICD-10-CM | POA: Insufficient documentation

## 2016-05-27 DIAGNOSIS — M549 Dorsalgia, unspecified: Secondary | ICD-10-CM

## 2016-05-27 HISTORY — DX: Dorsalgia, unspecified: M54.9

## 2016-05-27 MED ORDER — SODIUM CHLORIDE 0.9 % IV BOLUS (SEPSIS)
1000.0000 mL | Freq: Once | INTRAVENOUS | Status: AC
  Administered 2016-05-27: 1000 mL via INTRAVENOUS

## 2016-05-27 MED ORDER — HYDROMORPHONE HCL 2 MG/ML IJ SOLN
1.0000 mg | Freq: Once | INTRAMUSCULAR | Status: AC
  Administered 2016-05-27: 1 mg via INTRAVENOUS
  Filled 2016-05-27: qty 1

## 2016-05-27 MED ORDER — MORPHINE SULFATE (PF) 4 MG/ML IV SOLN
4.0000 mg | Freq: Once | INTRAVENOUS | Status: AC
  Administered 2016-05-27: 4 mg via INTRAVENOUS
  Filled 2016-05-27: qty 1

## 2016-05-27 MED ORDER — HYDROMORPHONE HCL 1 MG/ML IJ SOLN
1.0000 mg | Freq: Once | INTRAMUSCULAR | Status: AC
  Administered 2016-05-27: 1 mg via INTRAVENOUS
  Filled 2016-05-27: qty 1

## 2016-05-27 MED ORDER — ONDANSETRON HCL 4 MG/2ML IJ SOLN
4.0000 mg | Freq: Once | INTRAMUSCULAR | Status: AC
  Administered 2016-05-27: 4 mg via INTRAVENOUS
  Filled 2016-05-27: qty 2

## 2016-05-27 NOTE — ED Provider Notes (Signed)
[facility]-EMERGENCY DEPT Provider Note   CSN: [id number] Arrival date & time: 05/27/16  2105 By signing my name below, I, [name], attest that this documentation has been prepared under the direction and in the presence of Jacalyn LefevreJulie Alexcia Schools, MD . Electronically Signed: [name], Scribe. 05/27/2016. 9:35 PM.   History   Chief Complaint Chief Complaint  Patient presents with  . Back Pain    HPI Evan Tanner is a 40 y.o. male with a SHx of back surgery who presents to the Emergency Department complaining of sudden onset, progressively worsening lower back pain with radiation up his spine onset two days ago. He states he was moving something with a friend when he twisted and felt a pop in his spine, followed by a tingling sensation to his waist. Pt rates his back pain 8/10 in severity. He has taken OTC medication with no relief; pain is exacerbated by movement.  Per pt, he has not been able to ambulate since his pain began.He notes associated urinary and bowel incontinence, nausea, vomiting, and weakness and numbness to his BLE. Pt states he [facility] [place]. He reports that the surgery was not finished "because I flat-lined, and they told me to expect problems with it". Pt denies any fever, chills or any other associated symptoms at this time.  The history is provided by the patient. No language interpreter was used.    Past Medical History:  Diagnosis Date  . Back pain    There are no active problems to display for this patient.   Past Surgical History:  Procedure Laterality Date  . ANKLE SURGERY    . BACK SURGERY      Home Medications    Prior to Admission medications   Not on File    Family History No family history on file.  Social History Social History  Substance Use Topics  . Smoking status: Current Every Day Smoker    Packs/day: 0.50    Years: 15.00    Types: Cigarettes  . Smokeless tobacco: Never Used  . Alcohol use  No     Allergies   Patient has no known allergies.   Review of Systems Review of Systems 10 systems reviewed and all are negative for acute change except as noted in the HPI.   Physical Exam Updated Vital Signs BP 125/83   Pulse 97   Temp 99.4 F (37.4 C) (Oral)   Resp 16   Ht 5\' 8"  (1.727 m)   Wt 145 lb (65.8 kg)   SpO2 100%   BMI 22.05 kg/m   Physical Exam  Constitutional: He is oriented to person, place, and time. He appears well-developed and well-nourished. No distress.  HENT:  Head: Normocephalic and atraumatic.  Eyes: Conjunctivae are normal.  Cardiovascular: Normal rate.   Pulmonary/Chest: Effort normal.  Abdominal: He exhibits no distension.  Musculoskeletal: He exhibits tenderness.  Pain with palpation to his back; subjective numbness to his legs bilaterally  Neurological: He is alert and oriented to person, place, and time.  Skin: Skin is warm and dry.  Psychiatric: He has a normal mood and affect.  Nursing note and vitals reviewed.   ED Treatments / Results  DIAGNOSTIC STUDIES:  Oxygen Saturation is 100% on RA, normal by my interpretation.    COORDINATION OF CARE:  9:24 PM Discussed treatment plan with pt at bedside and pt agreed to plan.   Labs (all labs ordered are listed, but only abnormal results  are displayed) Labs Reviewed - No data to display  EKG  EKG Interpretation None       Radiology No results found.  Procedures Procedures (including critical care time)  Medications Ordered in ED Medications  sodium chloride 0.9 % bolus 1,000 mL (1,000 mLs Intravenous New Bag/Given 05/27/16 2140)  morphine 4 MG/ML injection 4 mg (4 mg Intravenous Given 05/27/16 2141)  ondansetron (ZOFRAN) injection 4 mg (4 mg Intravenous Given 05/27/16 2140)     Initial Impression / Assessment and Plan / ED Course  I have reviewed the triage vital signs and the nursing notes.  Pertinent labs & imaging results that were available during my care of  the patient were reviewed by me and considered in my medical decision making (see chart for details).  Clinical Course    Due to the concern for cauda equina syndrome, pt needs an emergent MRI which is not available currently.  [facility].   Final Clinical Impressions(s) / ED Diagnoses   Final diagnoses:  Urinary incontinence, unspecified type  Acute midline low back pain with bilateral sciatica  Cauda equina syndrome (HCC)    New Prescriptions New Prescriptions   No medications on file    I personally performed the services described in this documentation, which was scribed in my presence. The recorded information has been reviewed and is accurate.    Jacalyn LefevreJulie Muzamil Harker, MD 05/27/16 2141

## 2016-05-27 NOTE — ED Triage Notes (Addendum)
Patient reports of having back surgery in 2014. States 2 days ago he felt a pop in back started with tingling at his waist below now going up back. Patient states he is barely able to walk.  Reports of having urinary and bowel incontinence.   Patient [facility] [place].

## 2016-05-27 NOTE — ED Provider Notes (Signed)
By signing my name below, I, Arianna Nassar, attest that this documentation has been prepared under the direction and in the presence of Shon Batonourtney F Horton, MD.  Electronically Signed: Octavia HeirArianna Nassar, ED Scribe. 05/27/16. 11:43 PM.   HPI Comments: Evan Tanner is a 40 y.o. male brought in by ambulance, who presents to the Emergency Department complaining of lower back pain that started two days ago. He states having 10/10 pain. He reports associated generalized weakness and urinary incontinence. Pt notes the pain radiates up into his spine. He reports feeling a pop in his spine which led to a tingling sensation. He has a surgical hx of back [facility] [place]. [facility] Dr. Particia NearingHaviland. Pt expresses his concern for cauda equina syndrome. [facility] and an emergent MRI.   On exam normal reflexes bilaterally, normal strength. MRI ordered. Patient given pain management. MRI without acute cord impingement. He does have multiple disc protrusions. Postvoid residual of 79 mL. Patient is able to ambulate independently. Neurosurgery follow-up provided. Medrol Dosepak and a short course of Norco.  Results for orders placed or performed during the hospital encounter of 05/27/16  I-Stat Chem 8, ED  Result Value Ref Range   Sodium 141 135 - 145 mmol/L   Potassium 4.0 3.5 - 5.1 mmol/L   Chloride 104 101 - 111 mmol/L   BUN 15 6 - 20 mg/dL   Creatinine, Ser 1.610.90 0.61 - 1.24 mg/dL   Glucose, Bld 99 65 - 99 mg/dL   Calcium, Ion 0.961.16 0.451.15 - 1.40 mmol/L   TCO2 24 0 - 100 mmol/L   Hemoglobin 14.6 13.0 - 17.0 g/dL   HCT 40.943.0 81.139.0 - 91.452.0 %   Mr Lumbar Spine W Wo Contrast  Result Date: 05/28/2016 CLINICAL DATA:  Initial evaluation for acute back pain. EXAM: MRI LUMBAR SPINE WITHOUT AND WITH CONTRAST TECHNIQUE: Multiplanar and multiecho pulse sequences of the lumbar spine were obtained without and with intravenous contrast. CONTRAST:   15mL MULTIHANCE GADOBENATE DIMEGLUMINE 529 MG/ML IV SOLN COMPARISON:  None available. FINDINGS: Segmentation: Normal segmentation. Lowest well-formed disc is labeled the L5-S1 level. Alignment: Vertebral bodies are normally aligned with preservation of the normal lumbar lordosis. No listhesis. Vertebrae: Patient is status post prior thoracolumbar fusion, with bilateral transpedicular fixation screws and interlocking rods in place at T11, L1, and L2. No hardware complication. There is a remote compression deformity of the T12 vertebral body with approximately 50% height loss and 5 mm bony retropulsion. Vertebral body heights are otherwise preserved. No other evidence for acute or chronic fracture. Signal intensity within the vertebral body bone marrow is normal. No abnormal enhancement. Conus medullaris: Extends to the T12-L1 level and appears normal. No evidence for cord compression. Paraspinal and other soft tissues: Paraspinous soft tissues are within normal limits. No acute soft tissue abnormality. No abnormal enhancement. Few scattered simple cyst noted within the kidneys bilaterally. Visualized vessel structures are otherwise unremarkable. The Disc levels: T11-12: Chronic compression deformity involving the superior endplate of T12 with approximately 5 mm bony retropulsion. Secondary flattening of the ventral thecal sac. Patient appears to be status post posterior decompression at this level. No significant canal stenosis. Foramina remain widely patent. T12-L1:  Negative. L1-2: Disc desiccation without significant disc bulge. No stenosis. L2-3: Central/left subarticular disc protrusion with superior migration (series 7, image 20). Protruding disc encroaches upon the left lateral recess without frank neural impingement. Superior migration measures 7  mm relative to the parent L2-3 disc space. Superimposed mild facet and ligamentum flavum hypertrophy. Resultant mild to moderate left lateral recess stenosis. Mild  to moderate canal narrowing. No significant foraminal stenosis. L3-4: No disc bulge or disc protrusion. Mild facet and ligamentum flavum hypertrophy. No stenosis. L4-5: No disc bulge or disc protrusion. Minimal facet and ligamentum flavum hypertrophy. No significant canal or foraminal stenosis. L5-S1: Small central disc protrusion with associated annular fissure. Protruding disc closely approximates the S1 nerve roots without frank neural impingement. No stenosis. IMPRESSION: 1. No acute abnormality identified within the lumbar spine. No evidence for cord compression. 2. Chronic T12 compression fracture with approximately 50% height loss and 5 mm bony retropulsion. Patient is status post posterior decompression at this level, with prior thoracolumbar fusion at T11 through L2. No complication. 3. Central/left subarticular disc protrusion at L2-3 with superior migration. Resultant mild to moderate canal and left lateral recess stenosis. 4. Small central disc protrusion at L5-S1 without frank neural impingement or significant stenosis. Electronically Signed   By: Rise MuBenjamin  McClintock M.D.   On: 05/28/2016 01:30     I personally performed the services described in this documentation, which was scribed in my presence. The recorded information has been reviewed and is accurate.     Shon Batonourtney F Horton, MD 05/28/16 [phone]

## 2016-05-28 LAB — I-STAT CHEM 8, ED
BUN: 15 mg/dL (ref 6–20)
CREATININE: 0.9 mg/dL (ref 0.61–1.24)
Calcium, Ion: 1.16 mmol/L (ref 1.15–1.40)
Chloride: 104 mmol/L (ref 101–111)
GLUCOSE: 99 mg/dL (ref 65–99)
HCT: 43 % (ref 39.0–52.0)
HEMOGLOBIN: 14.6 g/dL (ref 13.0–17.0)
Potassium: 4 mmol/L (ref 3.5–5.1)
Sodium: 141 mmol/L (ref 135–145)
TCO2: 24 mmol/L (ref 0–100)

## 2016-05-28 MED ORDER — GADOBENATE DIMEGLUMINE 529 MG/ML IV SOLN
15.0000 mL | Freq: Once | INTRAVENOUS | Status: AC | PRN
  Administered 2016-05-28: 15 mL via INTRAVENOUS

## 2016-05-28 MED ORDER — HYDROCODONE-ACETAMINOPHEN 5-325 MG PO TABS
1.0000 | ORAL_TABLET | ORAL | 0 refills | Status: DC | PRN
Start: 2016-05-28 — End: 2016-06-08

## 2016-05-28 MED ORDER — OXYCODONE-ACETAMINOPHEN 5-325 MG PO TABS
2.0000 | ORAL_TABLET | Freq: Once | ORAL | Status: AC
Start: 2016-05-28 — End: 2016-05-28
  Administered 2016-05-28: 2 via ORAL
  Filled 2016-05-28: qty 2

## 2016-05-28 MED ORDER — METHYLPREDNISOLONE 4 MG PO TBPK: ORAL_TABLET | ORAL | 0 refills | Status: DC

## 2016-05-28 NOTE — Discharge Instructions (Signed)
You were seen today for back pain. Your workup is reassuring. MRI does show some disc protrusions but nothing affecting your spine. Follow-up with neurosurgery as above.

## 2016-05-29 ENCOUNTER — Emergency Department (HOSPITAL_COMMUNITY)
Admission: EM | Admit: 2016-05-29 | Discharge: 2016-05-29 | Disposition: A | Discharge status: Home / Self Care | Payer: Self-pay | Attending: Dermatology | Admitting: Dermatology

## 2016-05-29 ENCOUNTER — Encounter (HOSPITAL_COMMUNITY): Payer: Self-pay | Admitting: Emergency Medicine

## 2016-05-29 DIAGNOSIS — R1110 Vomiting, unspecified: Secondary | ICD-10-CM | POA: Insufficient documentation

## 2016-05-29 DIAGNOSIS — Z5321 Procedure and treatment not carried out due to patient leaving prior to being seen by health care provider: Secondary | ICD-10-CM | POA: Insufficient documentation

## 2016-05-29 DIAGNOSIS — F17210 Nicotine dependence, cigarettes, uncomplicated: Secondary | ICD-10-CM | POA: Insufficient documentation

## 2016-05-29 DIAGNOSIS — M549 Dorsalgia, unspecified: Secondary | ICD-10-CM | POA: Insufficient documentation

## 2016-05-29 NOTE — ED Triage Notes (Signed)
Pt reports back pain that keeps him from being able to walk and emesis that started this am. Pt brought in by EMS, per report pt has had no vomiting with EMS or since arrival. Pt was able to stand and walk to the ambulance without assistance.

## 2016-05-29 NOTE — ED Notes (Signed)
Per registration pt left after Triage.

## 2016-05-31 ENCOUNTER — Emergency Department (HOSPITAL_COMMUNITY)
Admission: EM | Admit: 2016-05-31 | Discharge: 2016-05-31 | Disposition: A | Discharge status: Home / Self Care | Payer: Self-pay | Attending: Emergency Medicine | Admitting: Emergency Medicine

## 2016-05-31 ENCOUNTER — Encounter (HOSPITAL_COMMUNITY): Payer: Self-pay | Admitting: Emergency Medicine

## 2016-05-31 DIAGNOSIS — R200 Anesthesia of skin: Secondary | ICD-10-CM | POA: Insufficient documentation

## 2016-05-31 DIAGNOSIS — F17210 Nicotine dependence, cigarettes, uncomplicated: Secondary | ICD-10-CM | POA: Insufficient documentation

## 2016-05-31 DIAGNOSIS — M5442 Lumbago with sciatica, left side: Secondary | ICD-10-CM | POA: Insufficient documentation

## 2016-05-31 DIAGNOSIS — M5441 Lumbago with sciatica, right side: Secondary | ICD-10-CM | POA: Insufficient documentation

## 2016-05-31 DIAGNOSIS — Z79899 Other long term (current) drug therapy: Secondary | ICD-10-CM | POA: Insufficient documentation

## 2016-05-31 MED ORDER — CYCLOBENZAPRINE HCL 10 MG PO TABS
10.0000 mg | ORAL_TABLET | Freq: Once | ORAL | Status: AC
  Administered 2016-05-31: 10 mg via ORAL
  Filled 2016-05-31: qty 1

## 2016-05-31 MED ORDER — OXYCODONE-ACETAMINOPHEN 5-325 MG PO TABS
1.0000 | ORAL_TABLET | Freq: Once | ORAL | Status: AC
  Administered 2016-05-31: 1 via ORAL
  Filled 2016-05-31: qty 1

## 2016-05-31 MED ORDER — KETOROLAC TROMETHAMINE 60 MG/2ML IM SOLN
60.0000 mg | Freq: Once | INTRAMUSCULAR | Status: AC
  Administered 2016-05-31: 60 mg via INTRAMUSCULAR
  Filled 2016-05-31: qty 2

## 2016-05-31 MED ORDER — CYCLOBENZAPRINE HCL 10 MG PO TABS: 10.0000 mg | ORAL_TABLET | Freq: Three times a day (TID) | ORAL | 0 refills | Status: DC | PRN

## 2016-05-31 MED ORDER — NAPROXEN 500 MG PO TABS: 500.0000 mg | ORAL_TABLET | Freq: Two times a day (BID) | ORAL | 0 refills | Status: DC

## 2016-05-31 MED ORDER — GABAPENTIN 300 MG PO CAPS: 300.0000 mg | ORAL_CAPSULE | Freq: Three times a day (TID) | ORAL | 0 refills | Status: DC

## 2016-05-31 NOTE — ED Provider Notes (Signed)
[facility]-EMER[facility]Y DEPT Provider Note   CSN: [id number] Arrival date & time: 05/31/16  1528     History   Chief Complaint Chief Complaint  Patient presents with  . Back Pain    HPI Harlen LabsMatthew Dufresne is a 40 y.o. male.  HPI   Harlen LabsMatthew Hawe is a 40 y.o. male who presents to the Emergency Department complaining of continued low back pain.  Pt seen here few days for low backpain, urinary incontince, and bilateral LE numbness.  [facility] to rule out cauda quina syndrome.  He returns today complaining of continued symptoms.  He states that he has ran out of pain medications and taking steriods but pain not improving.  He denies fever, abd pain, or recent injury.  Past Medical History:  Diagnosis Date  . Back pain     There are no active problems to display for this patient.   Past Surgical History:  Procedure Laterality Date  . ANKLE SURGERY    . BACK SURGERY         Home Medications    Prior to Admission medications   Medication Sig Start Date End Date Taking? Authorizing Provider  acetaminophen (TYLENOL) 500 MG tablet Take 500 mg by mouth every 6 (six) hours as needed for mild pain.    Historical Provider, MD  HYDROcodone-acetaminophen (NORCO/VICODIN) 5-325 MG tablet Take 1 tablet by mouth every 4 (four) hours as needed. 05/28/16   Shon Batonourtney F Horton, MD  methylPREDNISolone (MEDROL DOSEPAK) 4 MG TBPK tablet Take as directed on package 05/28/16   Shon Batonourtney F Horton, MD    Family History History reviewed. No pertinent family history.  Social History Social History  Substance Use Topics  . Smoking status: Current Every Day Smoker    Packs/day: 0.50    Years: 15.00    Types: Cigarettes  . Smokeless tobacco: Never Used  . Alcohol use No     Allergies   Patient has no known allergies.   Review of Systems Review of Systems  Constitutional: Negative for fever.  Respiratory: Negative for shortness of breath.   Gastrointestinal:  Negative for abdominal pain, constipation and vomiting.  Genitourinary: Negative for decreased urine volume, difficulty urinating, dysuria, flank pain and hematuria.  Musculoskeletal: Positive for back pain. Negative for joint swelling.  Skin: Negative for rash.  Neurological: Positive for numbness. Negative for weakness.  All other systems reviewed and are negative.    Physical Exam Updated Vital Signs BP 108/61 (BP Location: Left Arm)   Pulse 74   Temp 98.3 F (36.8 C) (Oral)   Resp 16   Ht 5\' 8"  (1.727 m)   Wt 65.8 kg   SpO2 99%   BMI 22.05 kg/m   Physical Exam  Constitutional: He is oriented to person, place, and time. He appears well-developed and well-nourished. No distress.  HENT:  Head: Normocephalic and atraumatic.  Mouth/Throat: Oropharynx is clear and moist.  Neck: Normal range of motion. Neck supple.  Cardiovascular: Normal rate, regular rhythm and intact distal pulses.   No murmur heard. Pulmonary/Chest: Effort normal and breath sounds normal. No respiratory distress.  Abdominal: Soft. He exhibits no distension. There is no tenderness.  Musculoskeletal: He exhibits tenderness. He exhibits no edema.       Lumbar back: He exhibits tenderness and pain. He exhibits normal range of motion, no swelling, no deformity, no laceration and normal pulse.  ttp of the midline lower lumbar spine and right paraspinal muscles.  DP  pulses are brisk and symmetrical.  Distal sensation intact.  Pt has 5/5 strength against resistance of bilateral lower extremities.     Neurological: He is alert and oriented to person, place, and time. He has normal strength. No sensory deficit. He exhibits normal muscle tone. Coordination and gait normal.  Reflex Scores:      Patellar reflexes are 2+ on the right side and 2+ on the left side.      Achilles reflexes are 2+ on the right side and 2+ on the left side. Skin: Skin is warm and dry. No rash noted.  Nursing note and vitals reviewed.    ED  Treatments / Results  Labs (all labs ordered are listed, but only abnormal results are displayed) Labs Reviewed - No data to display  EKG  EKG Interpretation None       Radiology No results found.  Procedures Procedures (including critical care time)  Medications Ordered in ED Medications  ketorolac (TORADOL) injection 60 mg (60 mg Intramuscular Given 05/31/16 1637)  cyclobenzaprine (FLEXERIL) tablet 10 mg (10 mg Oral Given 05/31/16 1636)     Initial Impression / Assessment and Plan / ED Course  I have reviewed the triage vital signs and the nursing notes.  Pertinent labs & imaging results that were available during my care of the patient were reviewed by me and considered in my medical decision making (see chart for details).  Clinical Course     Pt here 4 days ago for same.  Had MRI on 05/27/16 that showed no cord impingement, multiple disc protrusions.   Has not made neurosurgery follow-up.  Requesting additional pain medications.  Pt is non-toxic appearing.  Ambulates in the dept with a steady gait.  I do not feel that further narcotic medication is indicated, I have discussed this with pt.  He verbalized understanding and agrees with gabapentin , naprosyn and flexeril  Final Clinical Impressions(s) / ED Diagnoses   Final diagnoses:  Midline low back pain with bilateral sciatica, unspecified chronicity    New Prescriptions Discharge Medication List as of 05/31/2016  5:04 PM    START taking these medications   Details  cyclobenzaprine (FLEXERIL) 10 MG tablet Take 1 tablet (10 mg total) by mouth 3 (three) times daily as needed., Starting Sat 05/31/2016, Print    gabapentin (NEURONTIN) 300 MG capsule Take 1 capsule (300 mg total) by mouth 3 (three) times daily., Starting Sat 05/31/2016, Print    naproxen (NAPROSYN) 500 MG tablet Take 1 tablet (500 mg total) by mouth 2 (two) times daily with a meal., Starting Sat 05/31/2016, Print         Wessley Emert Liverpoolriplett,  PA-C 06/02/16 0008    Maia PlanJoshua G Long, MD 06/03/16 [phone]

## 2016-05-31 NOTE — ED Notes (Signed)
Pt ambulated without difficulty

## 2016-05-31 NOTE — Discharge Instructions (Signed)
Call the case manager here next week to see if you qualify for possible assistance.

## 2016-05-31 NOTE — ED Triage Notes (Addendum)
Pt was seen for same a couple days ago, state pain has not improved. Reports urinary incontinence is not new, has "pins and needles" down his legs. Pt ambulatory with steady, even gait.

## 2016-06-08 ENCOUNTER — Emergency Department (HOSPITAL_COMMUNITY)
Admission: EM | Admit: 2016-06-08 | Discharge: 2016-06-09 | Disposition: A | Discharge status: Home / Self Care | Payer: Self-pay | Attending: Emergency Medicine | Admitting: Emergency Medicine

## 2016-06-08 ENCOUNTER — Encounter (HOSPITAL_COMMUNITY): Payer: Self-pay | Admitting: *Deleted

## 2016-06-08 DIAGNOSIS — F17210 Nicotine dependence, cigarettes, uncomplicated: Secondary | ICD-10-CM | POA: Insufficient documentation

## 2016-06-08 DIAGNOSIS — F909 Attention-deficit hyperactivity disorder, unspecified type: Secondary | ICD-10-CM | POA: Insufficient documentation

## 2016-06-08 DIAGNOSIS — M5440 Lumbago with sciatica, unspecified side: Secondary | ICD-10-CM | POA: Insufficient documentation

## 2016-06-08 DIAGNOSIS — G8929 Other chronic pain: Secondary | ICD-10-CM

## 2016-06-08 LAB — URINALYSIS, ROUTINE W REFLEX MICROSCOPIC
BILIRUBIN URINE: NEGATIVE
Glucose, UA: NEGATIVE mg/dL
Hgb urine dipstick: NEGATIVE
Ketones, ur: NEGATIVE mg/dL
LEUKOCYTES UA: NEGATIVE
NITRITE: NEGATIVE
PH: 5 (ref 5.0–8.0)
Protein, ur: NEGATIVE mg/dL
SPECIFIC GRAVITY, URINE: 1.014 (ref 1.005–1.030)

## 2016-06-08 MED ORDER — KETOROLAC TROMETHAMINE 60 MG/2ML IM SOLN
30.0000 mg | Freq: Once | INTRAMUSCULAR | Status: AC
  Administered 2016-06-08: 30 mg via INTRAMUSCULAR
  Filled 2016-06-08: qty 2

## 2016-06-08 MED ORDER — OXYCODONE-ACETAMINOPHEN 5-325 MG PO TABS
1.0000 | ORAL_TABLET | Freq: Once | ORAL | Status: AC
  Administered 2016-06-08: 1 via ORAL
  Filled 2016-06-08: qty 1

## 2016-06-08 NOTE — ED Triage Notes (Signed)
Pt c/o lower back pain that started yesterday; pt states he has been moving furniture; pt states he has been urinating blood

## 2016-06-08 NOTE — ED Provider Notes (Signed)
[facility] Provider Note   CSN: [id number] Arrival date & time: 06/08/16  2113     History   Chief Complaint Chief Complaint  Patient presents with  . Back Pain    HPI Evan Tanner is a 40 y.o. male who presents to the ED with chronic low back pain. Hx of back surgery in 2014. Patient reports he was readmitted for an infection but after that did well until the beginning of this month when he had recurrent severe pain that brought him to the ED. On 05/27/2016 he was treated in the ED when he complained of severe pain and loss of control of bladder and bowels and arranged for MRI on 05/28/16 that showed no cord impingement, multiple disc protrusions.   He returned to the ED 06/01/16 stating he was out of his pain medication and needed a refill. He was started on Flexeril, Neurontin and Naprosyn. Patient here tonight stating the medications are not helping and that he continues to have loss of control of bladder and bowels. He reports frequent urination and states his urine is a dark color.   HPI  Past Medical History:  Diagnosis Date  . Back pain     There are no active problems to display for this patient.   Past Surgical History:  Procedure Laterality Date  . ANKLE SURGERY    . BACK SURGERY         Home Medications    Prior to Admission medications   Medication Sig Start Date End Date Taking? Authorizing Provider  acetaminophen (TYLENOL) 500 MG tablet Take 500 mg by mouth every 6 (six) hours as needed for mild pain.   Yes Historical Provider, MD  diclofenac (VOLTAREN) 50 MG EC tablet Take 1 tablet (50 mg total) by mouth 2 (two) times daily. 06/09/16   Aravind Chrismer Orlene OchM Keyvon Herter, NP  methocarbamol (ROBAXIN) 500 MG tablet Take 1 tablet (500 mg total) by mouth 2 (two) times daily. 06/09/16   Koya Hunger Orlene OchM Nialah Saravia, NP    Family History History reviewed. No pertinent family history.  Social History Social History  Substance Use Topics  . Smoking status: Current Every Day Smoker   Packs/day: 0.50    Years: 15.00    Types: Cigarettes  . Smokeless tobacco: Never Used  . Alcohol use No     Allergies   Patient has no known allergies.   Review of Systems Review of Systems  Constitutional: Negative for chills and fever.  HENT: Negative.   Eyes: Negative for visual disturbance.  Respiratory: Negative for chest tightness, shortness of breath and wheezing.   Cardiovascular: Negative for chest pain and palpitations.  Gastrointestinal: Positive for nausea and vomiting. Negative for abdominal pain.  Genitourinary: Positive for dysuria, frequency and urgency. Negative for discharge.  Musculoskeletal: Positive for back pain.  Skin: Negative for rash and wound.  Neurological: Positive for light-headedness. Negative for syncope.  Psychiatric/Behavioral: Negative for confusion. The patient is not nervous/anxious.      Physical Exam Updated Vital Signs BP 132/82 (BP Location: Left Arm)   Pulse 84   Resp 20   Ht 5\' 8"  (1.727 m)   Wt 65.8 kg   SpO2 100%   BMI 22.05 kg/m   Physical Exam  Constitutional: He is oriented to person, place, and time. He appears well-developed and well-nourished. No distress.  HENT:  Head: Normocephalic and atraumatic.  Eyes: EOM are normal. Pupils are equal, round, and reactive to light.  Neck: Normal range of motion. Neck supple.  Cardiovascular: Normal rate and regular rhythm.   Pulmonary/Chest: Effort normal. No respiratory distress. He has no wheezes. He has no rales.  Abdominal: Soft. Bowel sounds are normal. There is no tenderness.  Genitourinary: Rectal exam shows anal tone normal.  Musculoskeletal: He exhibits no edema.       Lumbar back: He exhibits tenderness and spasm. He exhibits normal range of motion, no deformity and normal pulse.  Tender with palpation right flank area. Scar noted to lower back s/p surgery in 2014.   Neurological: He is alert and oriented to person, place, and time. He has normal strength. No cranial  nerve deficit or sensory deficit. Coordination and gait normal.  Reflex Scores:      Bicep reflexes are 2+ on the right side and 2+ on the left side.      Brachioradialis reflexes are 2+ on the right side and 2+ on the left side.      Patellar reflexes are 2+ on the right side and 2+ on the left side. No foot drag with ambulation.  Skin: Skin is warm and dry.  Psychiatric: He has a normal mood and affect. His behavior is normal.  Nursing note and vitals reviewed.    ED Treatments / Results  Labs (all labs ordered are listed, but only abnormal results are displayed) Labs Reviewed  URINALYSIS, ROUTINE W REFLEX MICROSCOPIC   Radiology No results found.  Procedures Procedures (including critical care time)  Medications Ordered in ED Medications  oxyCODONE-acetaminophen (PERCOCET/ROXICET) 5-325 MG per tablet 1 tablet (not administered)  methocarbamol (ROBAXIN) tablet 500 mg (not administered)  oxyCODONE-acetaminophen (PERCOCET/ROXICET) 5-325 MG per tablet 1 tablet (1 tablet Oral Given 06/08/16 2210)  ketorolac (TORADOL) injection 30 mg (30 mg Intramuscular Given 06/08/16 2210)     Initial Impression / Assessment and Plan / ED Course  I have reviewed the triage vital signs and the nursing notes.  Pertinent lab results that were available during my care of the patient were reviewed by me and considered in my medical decision making (see chart for details).  Clinical Course   40 y.o. male with low back pain that has not been relieved with prescribed medications from previous visits. Pain medication administered in the ED and patient stable for d/c without neuro deficits. He is ambulatory without difficulty but states that it causes pain in his lower back. Discussed with the patient that he will need to f/u with neuro or a PCP for continued pain management and further evaluation of his pain. Patient voices understanding and agrees with plan.   Final Clinical Impressions(s) / ED  Diagnoses   Final diagnoses:  Chronic low back pain with sciatica, sciatica laterality unspecified, unspecified back pain laterality    New Prescriptions New Prescriptions   DICLOFENAC (VOLTAREN) 50 MG EC TABLET    Take 1 tablet (50 mg total) by mouth 2 (two) times daily.   METHOCARBAMOL (ROBAXIN) 500 MG TABLET    Take 1 tablet (500 mg total) by mouth 2 (two) times daily.     [address] Dale[address] Latysha Thackston, NP 06/09/16 0110    Vanetta MuldersScott Zackowski, MD 06/09/16 [phone]

## 2016-06-09 MED ORDER — METHOCARBAMOL 500 MG PO TABS: 500.0000 mg | ORAL_TABLET | Freq: Two times a day (BID) | ORAL | 0 refills | Status: DC

## 2016-06-09 MED ORDER — DICLOFENAC SODIUM 50 MG PO TBEC: 50.0000 mg | DELAYED_RELEASE_TABLET | Freq: Two times a day (BID) | ORAL | 0 refills | Status: DC

## 2016-06-09 MED ORDER — OXYCODONE-ACETAMINOPHEN 5-325 MG PO TABS
1.0000 | ORAL_TABLET | Freq: Once | ORAL | Status: AC
  Administered 2016-06-09: 1 via ORAL
  Filled 2016-06-09: qty 1

## 2016-06-09 MED ORDER — METHOCARBAMOL 500 MG PO TABS
500.0000 mg | ORAL_TABLET | Freq: Once | ORAL | Status: AC
  Administered 2016-06-09: 500 mg via ORAL
  Filled 2016-06-09: qty 1

## 2016-06-09 NOTE — Discharge Instructions (Signed)
Call Dr. Gerilyn Pilgrimoonquah to schedule follow up.

## 2016-06-11 ENCOUNTER — Encounter (HOSPITAL_COMMUNITY): Payer: Self-pay

## 2016-06-11 ENCOUNTER — Emergency Department (HOSPITAL_COMMUNITY)
Admission: EM | Admit: 2016-06-11 | Discharge: 2016-06-12 | Disposition: A | Discharge status: Home / Self Care | Payer: Self-pay | Attending: Emergency Medicine | Admitting: Emergency Medicine

## 2016-06-11 DIAGNOSIS — Z5900 Homelessness unspecified: Secondary | ICD-10-CM

## 2016-06-11 DIAGNOSIS — R51 Headache: Secondary | ICD-10-CM | POA: Insufficient documentation

## 2016-06-11 DIAGNOSIS — F332 Major depressive disorder, recurrent severe without psychotic features: Secondary | ICD-10-CM | POA: Insufficient documentation

## 2016-06-11 DIAGNOSIS — Z5181 Encounter for therapeutic drug level monitoring: Secondary | ICD-10-CM | POA: Insufficient documentation

## 2016-06-11 DIAGNOSIS — R45851 Suicidal ideations: Secondary | ICD-10-CM

## 2016-06-11 DIAGNOSIS — F909 Attention-deficit hyperactivity disorder, unspecified type: Secondary | ICD-10-CM | POA: Insufficient documentation

## 2016-06-11 DIAGNOSIS — F17210 Nicotine dependence, cigarettes, uncomplicated: Secondary | ICD-10-CM | POA: Insufficient documentation

## 2016-06-11 DIAGNOSIS — R519 Headache, unspecified: Secondary | ICD-10-CM

## 2016-06-11 LAB — RAPID URINE DRUG SCREEN, HOSP PERFORMED
Amphetamines: NOT DETECTED
BARBITURATES: NOT DETECTED
Benzodiazepines: NOT DETECTED
COCAINE: NOT DETECTED
Opiates: NOT DETECTED
Tetrahydrocannabinol: NOT DETECTED

## 2016-06-11 LAB — CBC
HEMATOCRIT: 45.8 % (ref 39.0–52.0)
Hemoglobin: 16.1 g/dL (ref 13.0–17.0)
MCH: 33.8 pg (ref 26.0–34.0)
MCHC: 35.2 g/dL (ref 30.0–36.0)
MCV: 96 fL (ref 78.0–100.0)
Platelets: 260 10*3/uL (ref 150–400)
RBC: 4.77 MIL/uL (ref 4.22–5.81)
RDW: 12.8 % (ref 11.5–15.5)
WBC: 7.9 10*3/uL (ref 4.0–10.5)

## 2016-06-11 LAB — SALICYLATE LEVEL: Salicylate Lvl: <7 mg/dL (ref 2.8–30.0)

## 2016-06-11 LAB — COMPREHENSIVE METABOLIC PANEL
ALK PHOS: 68 U/L (ref 38–126)
ALT: 16 U/L — ABNORMAL LOW (ref 17–63)
ANION GAP: 6 (ref 5–15)
AST: 19 U/L (ref 15–41)
Albumin: 4.6 g/dL (ref 3.5–5.0)
BILIRUBIN TOTAL: 0.7 mg/dL (ref 0.3–1.2)
BUN: 15 mg/dL (ref 6–20)
CALCIUM: 9.4 mg/dL (ref 8.9–10.3)
CO2: 26 mmol/L (ref 22–32)
Chloride: 107 mmol/L (ref 101–111)
Creatinine, Ser: 1 mg/dL (ref 0.61–1.24)
GFR calc Af Amer: >60 mL/min (ref >60)
GFR calc non Af Amer: >60 mL/min (ref >60)
GLUCOSE: 93 mg/dL (ref 65–99)
Potassium: 3.5 mmol/L (ref 3.5–5.1)
Sodium: 139 mmol/L (ref 135–145)
TOTAL PROTEIN: 7.6 g/dL (ref 6.5–8.1)

## 2016-06-11 LAB — ACETAMINOPHEN LEVEL

## 2016-06-11 LAB — ETHANOL: Alcohol, Ethyl (B): <5 mg/dL (ref <5)

## 2016-06-11 MED ORDER — METOCLOPRAMIDE HCL 5 MG/ML IJ SOLN
10.0000 mg | Freq: Once | INTRAMUSCULAR | Status: AC
  Administered 2016-06-11: 10 mg via INTRAVENOUS
  Filled 2016-06-11: qty 2

## 2016-06-11 MED ORDER — NICOTINE 21 MG/24HR TD PT24: 21.0000 mg | MEDICATED_PATCH | Freq: Every day | TRANSDERMAL | Status: DC

## 2016-06-11 MED ORDER — ACETAMINOPHEN 325 MG PO TABS: 650.0000 mg | ORAL_TABLET | ORAL | Status: DC | PRN

## 2016-06-11 MED ORDER — ACETAMINOPHEN 500 MG PO TABS
1000.0000 mg | ORAL_TABLET | Freq: Once | ORAL | Status: AC
  Administered 2016-06-11: 1000 mg via ORAL
  Filled 2016-06-11: qty 2

## 2016-06-11 NOTE — ED Triage Notes (Signed)
Patient states that he has been vomiting for the past few days.  Unable to keep anything down.  Patient was picked up at a gas station in [place], [place].  Patient states that he has no where to stay, does not have an ID and will not be able to allowed to stay in a shelter without an ID.  Patient states that he is having suicidal thoughts today.
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
## 2016-06-11 NOTE — ED Provider Notes (Signed)
[facility] DEPT Provider Note   CSN: [id number] Arrival date & time: 06/11/16  1933     History   Chief Complaint Chief Complaint  Patient presents with  . Emesis  . V70.1    HPI Evan Tanner is a 41 y.o. male.Patient complains of feeling suicidal for the past 4 days. Plan is to run out in front of traffic, or drink alcohol until he denies. He is not used any alcohol or drugs for 2 years.He states his mind is racing. He is depressed over homelessness lack of job He also complains of diffuse gradual onset throbbing headache 3 or 4 days ago accompanied by nausea and several episodes of vomiting. He states he is hungry. He denies any abdominal pain or chest pain. Denies fever. Denies trauma. No treatment prior to coming here. Associated symptoms include photophobia No other associated symptoms. Headache is made worse with looking to light not improved with anything.  HPI  Past Medical History:  Diagnosis Date  . Back pain    Prior suicide attempt There are no active problems to display for this patient.   Past Surgical History:  Procedure Laterality Date  . ANKLE SURGERY    . BACK SURGERY         Home Medications    Prior to Admission medications   Medication Sig Start Date End Date Taking? Authorizing Provider  diclofenac (VOLTAREN) 50 MG EC tablet Take 1 tablet (50 mg total) by mouth 2 (two) times daily. Patient not taking: Reported on 06/11/2016 06/09/16   Janne Napoleon, NP  methocarbamol (ROBAXIN) 500 MG tablet Take 1 tablet (500 mg total) by mouth 2 (two) times daily. Patient not taking: Reported on 06/11/2016 06/09/16   Janne Napoleon, NP    Family History No family history on file.  Social History Social History  Substance Use Topics  . Smoking status: Current Every Day Smoker    Packs/day: 0.50    Years: 15.00    Types: Cigarettes  . Smokeless tobacco: Never Used  . Alcohol use No   Denies alcohol denies illicit drug use  Allergies   Patient has no known  allergies.   Review of Systems Review of Systems  Constitutional: Negative.   HENT: Negative.   Eyes: Positive for photophobia.  Respiratory: Negative.   Cardiovascular: Negative.   Gastrointestinal: Negative.   Musculoskeletal: Negative.   Skin: Negative.   Neurological: Positive for headaches.  Psychiatric/Behavioral: Positive for suicidal ideas.  All other systems reviewed and are negative.    Physical Exam Updated Vital Signs BP 133/87 (BP Location: Left Arm)   Pulse 68   Temp 98.3 F (36.8 C) (Oral)   Resp 17   Ht 5\' 8"  (1.727 m)   Wt 145 lb (65.8 kg)   SpO2 99%   BMI 22.05 kg/m   Physical Exam  Constitutional: He is oriented to person, place, and time. He appears well-developed and well-nourished. No distress.  HENT:  Head: Normocephalic and atraumatic.  Eyes: Conjunctivae are normal. Pupils are equal, round, and reactive to light.  Neck: Neck supple. No tracheal deviation present. No thyromegaly present.  Cardiovascular: Normal rate and regular rhythm.   No murmur heard. Pulmonary/Chest: Effort normal and breath sounds normal.  Abdominal: Soft. Bowel sounds are normal. He exhibits no distension. There is no tenderness.  Musculoskeletal: Normal range of motion. He exhibits no edema or tenderness.  Neurological: He is alert and oriented to person, place, and time. He displays normal reflexes. No cranial nerve  deficit. Coordination normal.  Gait normal Romberg normal pronator drift normal finger to nose normal DTR symmetric bilaterally at knee jerk ankle jerk and biceps toes downgoing bilaterally  Skin: Skin is warm and dry. No rash noted.  Psychiatric: He has a normal mood and affect.  Nursing note and vitals reviewed.    ED Treatments / Results  Labs (all labs ordered are listed, but only abnormal results are displayed) Labs Reviewed  COMPREHENSIVE METABOLIC PANEL  ETHANOL  SALICYLATE LEVEL  ACETAMINOPHEN LEVEL  CBC  RAPID URINE DRUG SCREEN, HOSP  PERFORMED    EKG  EKG Interpretation None      Results for orders placed or performed during the hospital encounter of 06/11/16  Comprehensive metabolic panel  Result Value Ref Range   Sodium 139 135 - 145 mmol/L   Potassium 3.5 3.5 - 5.1 mmol/L   Chloride 107 101 - 111 mmol/L   CO2 26 22 - 32 mmol/L   Glucose, Bld 93 65 - 99 mg/dL   BUN 15 6 - 20 mg/dL   Creatinine, Ser 1.611.00 0.61 - 1.24 mg/dL   Calcium 9.4 8.9 - 09.610.3 mg/dL   Total Protein 7.6 6.5 - 8.1 g/dL   Albumin 4.6 3.5 - 5.0 g/dL   AST 19 15 - 41 U/L   ALT 16 (L) 17 - 63 U/L   Alkaline Phosphatase 68 38 - 126 U/L   Total Bilirubin 0.7 0.3 - 1.2 mg/dL   GFR calc non Af Amer >60 >60 mL/min   GFR calc Af Amer >60 >60 mL/min   Anion gap 6 5 - 15  Ethanol  Result Value Ref Range   Alcohol, Ethyl (B) <5 <5 mg/dL  Salicylate level  Result Value Ref Range   Salicylate Lvl <7.0 2.8 - 30.0 mg/dL  Acetaminophen level  Result Value Ref Range   Acetaminophen (Tylenol), Serum <10 (L) 10 - 30 ug/mL  cbc  Result Value Ref Range   WBC 7.9 4.0 - 10.5 K/uL   RBC 4.77 4.22 - 5.81 MIL/uL   Hemoglobin 16.1 13.0 - 17.0 g/dL   HCT 04.545.8 40.939.0 - 81.152.0 %   MCV 96.0 78.0 - 100.0 fL   MCH 33.8 26.0 - 34.0 pg   MCHC 35.2 30.0 - 36.0 g/dL   RDW 91.412.8 78.211.5 - 95.615.5 %   Platelets 260 150 - 400 K/uL   Mr Lumbar Spine W Wo Contrast  Result Date: 05/28/2016 CLINICAL DATA:  Initial evaluation for acute back pain. EXAM: MRI LUMBAR SPINE WITHOUT AND WITH CONTRAST TECHNIQUE: Multiplanar and multiecho pulse sequences of the lumbar spine were obtained without and with intravenous contrast. CONTRAST:  15mL MULTIHANCE GADOBENATE DIMEGLUMINE 529 MG/ML IV SOLN COMPARISON:  None available. FINDINGS: Segmentation: Normal segmentation. Lowest well-formed disc is labeled the L5-S1 level. Alignment: Vertebral bodies are normally aligned with preservation of the normal lumbar lordosis. No listhesis. Vertebrae: Patient is status post prior thoracolumbar fusion,  with bilateral transpedicular fixation screws and interlocking rods in place at T11, L1, and L2. No hardware complication. There is a remote compression deformity of the T12 vertebral body with approximately 50% height loss and 5 mm bony retropulsion. Vertebral body heights are otherwise preserved. No other evidence for acute or chronic fracture. Signal intensity within the vertebral body bone marrow is normal. No abnormal enhancement. Conus medullaris: Extends to the T12-L1 level and appears normal. No evidence for cord compression. Paraspinal and other soft tissues: Paraspinous soft tissues are within normal limits. No acute soft tissue abnormality.  No abnormal enhancement. Few scattered simple cyst noted within the kidneys bilaterally. Visualized vessel structures are otherwise unremarkable. The Disc levels: T11-12: Chronic compression deformity involving the superior endplate of T12 with approximately 5 mm bony retropulsion. Secondary flattening of the ventral thecal sac. Patient appears to be status post posterior decompression at this level. No significant canal stenosis. Foramina remain widely patent. T12-L1:  Negative. L1-2: Disc desiccation without significant disc bulge. No stenosis. L2-3: Central/left subarticular disc protrusion with superior migration (series 7, image 20). Protruding disc encroaches upon the left lateral recess without frank neural impingement. Superior migration measures 7 mm relative to the parent L2-3 disc space. Superimposed mild facet and ligamentum flavum hypertrophy. Resultant mild to moderate left lateral recess stenosis. Mild to moderate canal narrowing. No significant foraminal stenosis. L3-4: No disc bulge or disc protrusion. Mild facet and ligamentum flavum hypertrophy. No stenosis. L4-5: No disc bulge or disc protrusion. Minimal facet and ligamentum flavum hypertrophy. No significant canal or foraminal stenosis. L5-S1: Small central disc protrusion with associated annular  fissure. Protruding disc closely approximates the S1 nerve roots without frank neural impingement. No stenosis. IMPRESSION: 1. No acute abnormality identified within the lumbar spine. No evidence for cord compression. 2. Chronic T12 compression fracture with approximately 50% height loss and 5 mm bony retropulsion. Patient is status post posterior decompression at this level, with prior thoracolumbar fusion at T11 through L2. No complication. 3. Central/left subarticular disc protrusion at L2-3 with superior migration. Resultant mild to moderate canal and left lateral recess stenosis. 4. Small central disc protrusion at L5-S1 without frank neural impingement or significant stenosis. Electronically Signed   By: Rise Mu M.D.   On: 05/28/2016 01:30    Radiology No results found.  Procedures Procedures (including critical care time)  Medications Ordered in ED Medications  metoCLOPramide (REGLAN) injection 10 mg (not administered)   Results for orders placed or performed during the hospital encounter of 06/11/16  Comprehensive metabolic panel  Result Value Ref Range   Sodium 139 135 - 145 mmol/L   Potassium 3.5 3.5 - 5.1 mmol/L   Chloride 107 101 - 111 mmol/L   CO2 26 22 - 32 mmol/L   Glucose, Bld 93 65 - 99 mg/dL   BUN 15 6 - 20 mg/dL   Creatinine, Ser 1.61 0.61 - 1.24 mg/dL   Calcium 9.4 8.9 - 09.6 mg/dL   Total Protein 7.6 6.5 - 8.1 g/dL   Albumin 4.6 3.5 - 5.0 g/dL   AST 19 15 - 41 U/L   ALT 16 (L) 17 - 63 U/L   Alkaline Phosphatase 68 38 - 126 U/L   Total Bilirubin 0.7 0.3 - 1.2 mg/dL   GFR calc non Af Amer >60 >60 mL/min   GFR calc Af Amer >60 >60 mL/min   Anion gap 6 5 - 15  Ethanol  Result Value Ref Range   Alcohol, Ethyl (B) <5 <5 mg/dL  Salicylate level  Result Value Ref Range   Salicylate Lvl <7.0 2.8 - 30.0 mg/dL  Acetaminophen level  Result Value Ref Range   Acetaminophen (Tylenol), Serum <10 (L) 10 - 30 ug/mL  cbc  Result Value Ref Range   WBC 7.9 4.0  - 10.5 K/uL   RBC 4.77 4.22 - 5.81 MIL/uL   Hemoglobin 16.1 13.0 - 17.0 g/dL   HCT 04.5 40.9 - 81.1 %   MCV 96.0 78.0 - 100.0 fL   MCH 33.8 26.0 - 34.0 pg   MCHC 35.2 30.0 - 36.0  g/dL   RDW 16.1 09.6 - 04.5 %   Platelets 260 150 - 400 K/uL   Mr Lumbar Spine W Wo Contrast  Result Date: 05/28/2016 CLINICAL DATA:  Initial evaluation for acute back pain. EXAM: MRI LUMBAR SPINE WITHOUT AND WITH CONTRAST TECHNIQUE: Multiplanar and multiecho pulse sequences of the lumbar spine were obtained without and with intravenous contrast. CONTRAST:  15mL MULTIHANCE GADOBENATE DIMEGLUMINE 529 MG/ML IV SOLN COMPARISON:  None available. FINDINGS: Segmentation: Normal segmentation. Lowest well-formed disc is labeled the L5-S1 level. Alignment: Vertebral bodies are normally aligned with preservation of the normal lumbar lordosis. No listhesis. Vertebrae: Patient is status post prior thoracolumbar fusion, with bilateral transpedicular fixation screws and interlocking rods in place at T11, L1, and L2. No hardware complication. There is a remote compression deformity of the T12 vertebral body with approximately 50% height loss and 5 mm bony retropulsion. Vertebral body heights are otherwise preserved. No other evidence for acute or chronic fracture. Signal intensity within the vertebral body bone marrow is normal. No abnormal enhancement. Conus medullaris: Extends to the T12-L1 level and appears normal. No evidence for cord compression. Paraspinal and other soft tissues: Paraspinous soft tissues are within normal limits. No acute soft tissue abnormality. No abnormal enhancement. Few scattered simple cyst noted within the kidneys bilaterally. Visualized vessel structures are otherwise unremarkable. The Disc levels: T11-12: Chronic compression deformity involving the superior endplate of T12 with approximately 5 mm bony retropulsion. Secondary flattening of the ventral thecal sac. Patient appears to be status post posterior  decompression at this level. No significant canal stenosis. Foramina remain widely patent. T12-L1:  Negative. L1-2: Disc desiccation without significant disc bulge. No stenosis. L2-3: Central/left subarticular disc protrusion with superior migration (series 7, image 20). Protruding disc encroaches upon the left lateral recess without frank neural impingement. Superior migration measures 7 mm relative to the parent L2-3 disc space. Superimposed mild facet and ligamentum flavum hypertrophy. Resultant mild to moderate left lateral recess stenosis. Mild to moderate canal narrowing. No significant foraminal stenosis. L3-4: No disc bulge or disc protrusion. Mild facet and ligamentum flavum hypertrophy. No stenosis. L4-5: No disc bulge or disc protrusion. Minimal facet and ligamentum flavum hypertrophy. No significant canal or foraminal stenosis. L5-S1: Small central disc protrusion with associated annular fissure. Protruding disc closely approximates the S1 nerve roots without frank neural impingement. No stenosis. IMPRESSION: 1. No acute abnormality identified within the lumbar spine. No evidence for cord compression. 2. Chronic T12 compression fracture with approximately 50% height loss and 5 mm bony retropulsion. Patient is status post posterior decompression at this level, with prior thoracolumbar fusion at T11 through L2. No complication. 3. Central/left subarticular disc protrusion at L2-3 with superior migration. Resultant mild to moderate canal and left lateral recess stenosis. 4. Small central disc protrusion at L5-S1 without frank neural impingement or significant stenosis. Electronically Signed   By: Rise Mu M.D.   On: 05/28/2016 01:30   Initial Impression / Assessment and Plan / ED Course  I have reviewed the triage vital signs and the nursing notes.  Pertinent labs & imaging results that were available during my care of the patient were reviewed by me and considered in my medical decision  making (see chart for details).  Clinical Course     10 PM patient states headache continues but nausea has resolved after treatment with IV Reglan. Headache is throbbing in the left temporal area. Feel that patient needs emergent imaging of his brain. Headache gradual onset. Headache is migrainous in  quality, gradual onset throbbing with photophobia. Tylenol ordered. TTS consulted. Patient's felt to be at least moderate suicide risk and I feel that he requires at least overnight observation and evaluation by psychiatry or psychiatric admission. He is medically cleared for psychiatric evaluation Final Clinical Impressions(s) / ED Diagnoses  Diagnosis #1 suicidal ideation #2 nonspecific headache Final diagnoses:  None    New Prescriptions New Prescriptions   No medications on file     Doug Sou, MD 06/11/16 2209

## 2016-06-12 ENCOUNTER — Encounter (HOSPITAL_COMMUNITY): Payer: Self-pay | Admitting: Nurse Practitioner

## 2016-06-12 ENCOUNTER — Emergency Department (HOSPITAL_COMMUNITY)
Admission: EM | Admit: 2016-06-12 | Discharge: 2016-06-13 | Disposition: A | Discharge status: Home / Self Care | Payer: Self-pay | Attending: Emergency Medicine | Admitting: Emergency Medicine

## 2016-06-12 ENCOUNTER — Encounter (HOSPITAL_COMMUNITY): Payer: Self-pay | Admitting: Emergency Medicine

## 2016-06-12 ENCOUNTER — Emergency Department (HOSPITAL_COMMUNITY)
Admission: EM | Admit: 2016-06-12 | Discharge: 2016-06-12 | Disposition: A | Discharge status: Home / Self Care | Payer: Self-pay | Attending: Emergency Medicine | Admitting: Emergency Medicine

## 2016-06-12 DIAGNOSIS — G4700 Insomnia, unspecified: Secondary | ICD-10-CM | POA: Insufficient documentation

## 2016-06-12 DIAGNOSIS — F17210 Nicotine dependence, cigarettes, uncomplicated: Secondary | ICD-10-CM | POA: Insufficient documentation

## 2016-06-12 DIAGNOSIS — F332 Major depressive disorder, recurrent severe without psychotic features: Secondary | ICD-10-CM | POA: Insufficient documentation

## 2016-06-12 DIAGNOSIS — Z79899 Other long term (current) drug therapy: Secondary | ICD-10-CM

## 2016-06-12 DIAGNOSIS — R45851 Suicidal ideations: Secondary | ICD-10-CM

## 2016-06-12 DIAGNOSIS — F909 Attention-deficit hyperactivity disorder, unspecified type: Secondary | ICD-10-CM | POA: Insufficient documentation

## 2016-06-12 DIAGNOSIS — F4324 Adjustment disorder with disturbance of conduct: Secondary | ICD-10-CM

## 2016-06-12 DIAGNOSIS — F419 Anxiety disorder, unspecified: Secondary | ICD-10-CM | POA: Insufficient documentation

## 2016-06-12 HISTORY — DX: Attention-deficit hyperactivity disorder, unspecified type: F90.9

## 2016-06-12 LAB — COMPREHENSIVE METABOLIC PANEL
ALBUMIN: 4.3 g/dL (ref 3.5–5.0)
ALT: 15 U/L — ABNORMAL LOW (ref 17–63)
ANION GAP: 9 (ref 5–15)
AST: 21 U/L (ref 15–41)
Alkaline Phosphatase: 62 U/L (ref 38–126)
BILIRUBIN TOTAL: 0.7 mg/dL (ref 0.3–1.2)
BUN: 14 mg/dL (ref 6–20)
CALCIUM: 9.5 mg/dL (ref 8.9–10.3)
CO2: 23 mmol/L (ref 22–32)
Chloride: 107 mmol/L (ref 101–111)
Creatinine, Ser: 1.01 mg/dL (ref 0.61–1.24)
GFR calc non Af Amer: >60 mL/min (ref >60)
GLUCOSE: 117 mg/dL — AB (ref 65–99)
POTASSIUM: 3.8 mmol/L (ref 3.5–5.1)
SODIUM: 139 mmol/L (ref 135–145)
TOTAL PROTEIN: 7.1 g/dL (ref 6.5–8.1)

## 2016-06-12 LAB — RAPID URINE DRUG SCREEN, HOSP PERFORMED
Amphetamines: NOT DETECTED
BARBITURATES: NOT DETECTED
BENZODIAZEPINES: NOT DETECTED
COCAINE: NOT DETECTED
OPIATES: NOT DETECTED
Tetrahydrocannabinol: NOT DETECTED

## 2016-06-12 LAB — CBC
HEMATOCRIT: 42.1 % (ref 39.0–52.0)
Hemoglobin: 15 g/dL (ref 13.0–17.0)
MCH: 33 pg (ref 26.0–34.0)
MCHC: 35.6 g/dL (ref 30.0–36.0)
MCV: 92.7 fL (ref 78.0–100.0)
PLATELETS: 268 10*3/uL (ref 150–400)
RBC: 4.54 MIL/uL (ref 4.22–5.81)
RDW: 12.8 % (ref 11.5–15.5)
WBC: 8.2 10*3/uL (ref 4.0–10.5)

## 2016-06-12 LAB — ACETAMINOPHEN LEVEL

## 2016-06-12 LAB — SALICYLATE LEVEL

## 2016-06-12 LAB — ETHANOL: Alcohol, Ethyl (B): <5 mg/dL (ref <5)

## 2016-06-12 MED ORDER — TRAZODONE HCL 50 MG PO TABS
50.0000 mg | ORAL_TABLET | Freq: Every evening | ORAL | Status: DC | PRN
  Administered 2016-06-13: 50 mg via ORAL
  Filled 2016-06-12: qty 1

## 2016-06-12 MED ORDER — IBUPROFEN 200 MG PO TABS
600.0000 mg | ORAL_TABLET | Freq: Three times a day (TID) | ORAL | Status: DC | PRN
  Administered 2016-06-12: 600 mg via ORAL
  Filled 2016-06-12: qty 3

## 2016-06-12 MED ORDER — ALUM & MAG HYDROXIDE-SIMETH 200-200-20 MG/5ML PO SUSP: 30.0000 mL | ORAL | Status: DC | PRN

## 2016-06-12 MED ORDER — ONDANSETRON HCL 4 MG PO TABS: 4.0000 mg | ORAL_TABLET | Freq: Three times a day (TID) | ORAL | Status: DC | PRN

## 2016-06-12 MED ORDER — ACETAMINOPHEN 325 MG PO TABS: 650.0000 mg | ORAL_TABLET | ORAL | Status: DC | PRN

## 2016-06-12 MED ORDER — HYDROXYZINE HCL 25 MG PO TABS
25.0000 mg | ORAL_TABLET | Freq: Three times a day (TID) | ORAL | Status: DC | PRN
  Administered 2016-06-12: 25 mg via ORAL
  Filled 2016-06-12: qty 1

## 2016-06-12 MED ORDER — HYDROXYZINE HCL 25 MG PO TABS: 25.0000 mg | ORAL_TABLET | Freq: Three times a day (TID) | ORAL | 0 refills | Status: AC | PRN

## 2016-06-12 NOTE — BH Assessment (Addendum)
Tele Assessment Note   Evan Tanner is a 41 y.o. male who presents to [facility] by EMS voluntarily from homeless shelter. Pt was assessed and discharged from [facility] earlier today for similar symptoms and feelings. Pt reports having ongoing headaches and nightmares of hurting himself or others. Pt states he cannot contract for safety due to the intensity of the thoughts. He currently denies S/I, H/I and AV hallucinations. Pt reports feeling very depressed and overwhelmed with his life. He states, " If something were to happen to me, I would not care because I am tired of living like this. I hope this is not what my life is going to consist of".  Pt states his primary stressors are being homeless, being so far away from his home and people he knows in [place], and dealing with these constant headaches. Pt states he has had several suicidal attempts in the past. Pt denies any trauma or abuse history. Pt reports smoking cigarettes daily to help him calm his nerves. Pt  Is currently not receiving any outpatient or medication management services.   Pt presented alert in scrubs with logical/chorent speech. Pt's eye contact was good. Pt's mood was depressed. Pt's affect was appropriate to circumstance. Pt's thought process was coherent/relevant. Pt's judgement was parital. Pt's concentration was good. Pt;s insight and impulse control was fair. Pt was oriented x4 (date, year, city, and state.) Pt reported, if discharged from [facility] he could not contract for safety. Pt reported, if inpatient treatment is recommended he will sign in voluntarily.   Diagnosis: Bipolar, ADHD (per history) Major Depressive Disorder, Recurrent, Severe   Past Medical History:  Past Medical History:  Diagnosis Date  . ADHD   . Back pain     Past Surgical History:  Procedure Laterality Date  . ANKLE SURGERY    . BACK SURGERY      Family History: No family history on file.  Social History:  reports that he has been smoking  Cigarettes.  He has a 7.50 pack-year smoking history. He has never used smokeless tobacco. He reports that he does not drink alcohol or use drugs.  Additional Social History:  Alcohol / Drug Use Pain Medications: See MAR Prescriptions: See MAR Over the Counter: See MAR History of alcohol / drug use?: Yes Longest period of sobriety (when/how long): unknown  Substance #1 Name of Substance 1: Cigarettes 1 - Age of First Use: UTA 1 - Amount (size/oz): Pt reported, amoking a fourth of a pack of cigarettes daily.  1 - Frequency: UTA 1 - Duration: UTA 1 - Last Use / Amount: Pt reported, amoking a fourth of a pack of cigarettes daily.   CIWA: CIWA-Ar BP: 121/78 Pulse Rate: 81 COWS:    PATIENT STRENGTHS: (choose at least two) Ability for insight Average or above average intelligence Capable of independent living Communication skills General fund of knowledge Motivation for treatment/growth Physical Health  Allergies: No Known Allergies  Home Medications:  (Not in a hospital admission)  OB/GYN Status:  No LMP for male patient.  General Assessment Data Location of Assessment: [facility] ED TTS Assessment: In system Is this a Tele or Face-to-Face Assessment?: Face-to-Face Is this an Initial Assessment or a Re-assessment for this encounter?: Initial Assessment Marital status: Single Is patient pregnant?: No Pregnancy Status: No Living Arrangements: Other (Comment) (homeless) Can pt return to current living arrangement?: No Admission Status: Voluntary Is patient capable of signing voluntary admission?: Yes Referral Source: Self/Family/Friend Insurance type: none  Medical Screening Exam [facility] Walk-in  ONLY) Medical Exam completed: Yes  Crisis Care Plan Living Arrangements: Other (Comment) (homeless) Legal Guardian: Other: (self) Name of Psychiatrist: NA Name of Therapist: NA  Education Status Is patient currently in school?: No Current Grade: na Highest grade of school patient  has completed: Pt reported, 11th grade.  Name of school: NA Contact person: NA  Risk to self with the past 6 months Suicidal Ideation: No Has patient been a risk to self within the past 6 months prior to admission? : Yes Suicidal Intent: No-Not Currently/Within Last 6 Months Has patient had any suicidal intent within the past 6 months prior to admission? : Yes Is patient at risk for suicide?: Yes Suicidal Plan?: Yes-Currently Present Has patient had any suicidal plan within the past 6 months prior to admission? : Yes Specify Current Suicidal Plan: none Access to Means: No Specify Access to Suicidal Means: na What has been your use of drugs/alcohol within the last 12 months?: denies  Previous Attempts/Gestures: No How many times?: 3 Other Self Harm Risks: denies Triggers for Past Attempts: Unpredictable Intentional Self Injurious Behavior: None Family Suicide History: Unknown Recent stressful life event(s): Loss (Comment) (homeless, no family, no financial resources ) Persecutory voices/beliefs?: No Depression: Yes Depression Symptoms: Insomnia, Fatigue, Feeling worthless/self pity Substance abuse history and/or treatment for substance abuse?: No  Risk to Others within the past 6 months Homicidal Ideation: No Does patient have any lifetime risk of violence toward others beyond the six months prior to admission? : No Thoughts of Harm to Others: No Current Homicidal Intent: No Current Homicidal Plan: No Access to Homicidal Means: No Identified Victim: na History of harm to others?: No Assessment of Violence: None Noted Violent Behavior Description: na Does patient have access to weapons?: No Criminal Charges Pending?: No Does patient have a court date: No Is patient on probation?: No  Psychosis Hallucinations: Auditory Delusions: None noted  Mental Status Report Appearance/Hygiene: In scrubs Eye Contact: Good Motor Activity: Freedom of movement Speech:  Logical/coherent Level of Consciousness: Alert Mood: Anxious, Depressed Affect: Appropriate to circumstance Anxiety Level: None Thought Processes: Coherent Judgement: Impaired Orientation: Person, Place, Time, Situation Obsessive Compulsive Thoughts/Behaviors: None  Cognitive Functioning Concentration: Normal Memory: Recent Intact IQ: Average Insight: Fair Impulse Control: Fair Appetite: Poor Weight Loss: 0 Weight Gain: 0 Sleep: Decreased Total Hours of Sleep: 5 Vegetative Symptoms: None  ADLScreening [facility] Assessment Services) Patient's cognitive ability adequate to safely complete daily activities?: Yes Patient able to express need for assistance with ADLs?: Yes Independently performs ADLs?: Yes (appropriate for developmental age)  Prior Inpatient Therapy Prior Inpatient Therapy: No Prior Therapy Dates: NA Prior Therapy Facilty/Provider(s): NA Reason for Treatment: NA  Prior Outpatient Therapy Prior Outpatient Therapy: No Prior Therapy Dates: NA Prior Therapy Facilty/Provider(s): NA Reason for Treatment: NA Does patient have an ACCT team?: No Does patient have Intensive In-House Services?  : No Does patient have [facility] services? : No Does patient have P4CC services?: No  ADL Screening (condition at time of admission) Patient's cognitive ability adequate to safely complete daily activities?: Yes Is the patient deaf or have difficulty hearing?: No Does the patient have difficulty seeing, even when wearing glasses/contacts?: No Does the patient have difficulty concentrating, remembering, or making decisions?: No Patient able to express need for assistance with ADLs?: Yes Does the patient have difficulty dressing or bathing?: Yes Independently performs ADLs?: Yes (appropriate for developmental age) Does the patient have difficulty walking or climbing stairs?: No Weakness of Legs: None Weakness of Arms/Hands: None  Home Assistive Devices/Equipment Home Assistive  Devices/Equipment: None    Abuse/Neglect Assessment (Assessment to be complete while patient is alone) Physical Abuse: Denies Verbal Abuse: Denies Sexual Abuse: Denies Exploitation of patient/patient's resources: Denies Self-Neglect: Denies Values / Beliefs Cultural Requests During Hospitalization: None Spiritual Requests During Hospitalization: None   Advance Directives (For Healthcare) Does Patient Have a Medical Advance Directive?: No Would patient like information on creating a medical advance directive?: No - Patient declined    Additional Information 1:1 In Past 12 Months?: No CIRT Risk: No Elopement Risk: No Does patient have medical clearance?: No     Disposition: Gave clinical report to Nira Conn, NP who recommends pt be evaluated by psychiatry in the morning.   Orlie Pollen, LPC, NCC,  LCAS-A Therapeutic Triage Specialist  06/13/2016 12:02 AM '     Evlyn Courier 06/12/2016 11:52 PM

## 2016-06-12 NOTE — ED Notes (Signed)
Deputy sheriff at bedside

## 2016-06-12 NOTE — ED Triage Notes (Signed)
Pt presents with c/o ADHD. He states his ADHD has been severe for the past month. He is homeless and does not have a provider to manage his medical conditions. He reports anxiety, racing thoughts, anorexia associated with the ADHD. He denies andy thoughts of harming self or others.

## 2016-06-12 NOTE — ED Notes (Signed)
rpd at bedside

## 2016-06-12 NOTE — ED Notes (Signed)
Pt placed in maroon scrubs and wanded by security. 
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
## 2016-06-12 NOTE — ED Triage Notes (Addendum)
Pt picked up from homeless center; states he cannot be around other people at homeless center because he will "punch them in the face"; upon arrival to ER, [place] [employer] [place]; pt requesting medicine for his ADHD which he states is "out of control"; states he has "violent blackouts" when his ADHD gets out of control; endorses paranoia; pt has pressured speech and is jiggling leg up and down

## 2016-06-12 NOTE — ED Notes (Signed)
Security at bedside due to pt raising voice after teleassessment machine was wheeled in to room.  Pt is yelling at security saying that he can leave at his own will.  Security asked pt to lower his voice and pt stated " I can raise my goddamn voice if I want to, you cant tell me what to do."

## 2016-06-12 NOTE — ED Notes (Signed)
Case management at bedside.

## 2016-06-12 NOTE — B[facility] Counselor (Signed)
Writer reassessed pt this am. Pt was lying in bed with sheet pulled up to chest. Pt is oriented x 4. He currently denies SI. Pt reports he is no longer suicidal. When asked about his suicidal statements last night, pt says, "I was a little overwhelmed about a lot of stuff." Pt reports he plan to stay with a friend if discharged. [facility] and denies HI. No delusions noted. Pt says, "I just don't like hospitals." He reports his reason for presenting last night to [facility] was a HA. Pt reports has "not had many" suicide attempts. Pt a poor historian. He repeats that he wants to leave and isn't suicidal. Claudette Headonrad Withrow DNP will do telepsych consult this am to determine disposition.   Evette Cristalaroline Paige Mozetta Murfin, [place] Therapeutic Triage Specialist

## 2016-06-12 NOTE — ED Provider Notes (Signed)
Pt presented last night with SI and headache.  The pt is homeless and it was a very cold night last night.  Pt now wants to go home.  He denies that he ever said he was suicidal.  He said that he's hurt himself in the past, but does not have a plan now.  He tells me that if he goes home, he will not hurt himself.  I don't believe this patient is suicidal at this time.  He is stable for d/c.   Jacalyn LefevreJulie Jacorian Golaszewski, MD 06/12/16 1028

## 2016-06-12 NOTE — ED Provider Notes (Signed)
[facility]-EMERGENCY DEPT Provider Note   CSN: [id number] Arrival date & time: 06/12/16  2022     History   Chief Complaint Chief Complaint  Patient presents with  . Anxiety  . Suicidal    HPI Evan Tanner is a 41 y.o. male.  The history is provided by the patient. No language interpreter was used.  Anxiety     Evan Tanner is a 41 y.o. male who presents to the Emergency Department complaining of anxiety, SI.  He has a history of ADHD and has been off his Adderall for the last month. He endorses having increased anxiety and nightmares. He is having poor sleep and when he does sleep he is dreaming that he is trying to kill himself. He has episodes of explosive behavior and he is currently residing at a shelter and he is concerned that he might hurt somebody. He does smoke but denies any alcohol or drug use. He brought himself in today seeking treatment.He is thinking of shooting himself with a gun, jumping in traffic.   Past Medical History:  Diagnosis Date  . ADHD   . Back pain     Patient Active Problem List   Diagnosis Date Noted  . MDD (major depressive disorder), recurrent severe, without psychosis ([facility]) 06/12/2016    Past Surgical History:  Procedure Laterality Date  . ANKLE SURGERY    . BACK SURGERY         Home Medications    Prior to Admission medications   Medication Sig Start Date End Date Taking? Authorizing Provider  hydrOXYzine (ATARAX/VISTARIL) 25 MG tablet Take 1 tablet (25 mg total) by mouth every 8 (eight) hours as needed for anxiety. 06/12/16   Laurence Spates, MD    Family History No family history on file.  Social History Social History  Substance Use Topics  . Smoking status: Current Every Day Smoker    Packs/day: 0.50    Years: 15.00    Types: Cigarettes  . Smokeless tobacco: Never Used  . Alcohol use No     Allergies   Patient has no known allergies.   Review of Systems Review of Systems  All other systems reviewed and are  negative.    Physical Exam Updated Vital Signs BP 121/78 (BP Location: Left Arm)   Pulse 81   Temp 98.1 F (36.7 C) (Oral)   Resp 18   SpO2 100%   Physical Exam  Constitutional: He is oriented to person, place, and time. He appears well-developed and well-nourished.  HENT:  Head: Normocephalic and atraumatic.  Cardiovascular: Normal rate and regular rhythm.   Pulmonary/Chest: Effort normal. No respiratory distress.  Musculoskeletal: Normal range of motion.  Neurological: He is alert and oriented to person, place, and time.  Skin: Skin is warm.  Psychiatric:  Disheveled and anxious appearing  Nursing note and vitals reviewed.    ED Treatments / Results  Labs (all labs ordered are listed, but only abnormal results are displayed) Labs Reviewed  COMPREHENSIVE METABOLIC PANEL - Abnormal; Notable for the following:       Result Value   Glucose, Bld 117 (*)    ALT 15 (*)    All other components within normal limits  ACETAMINOPHEN LEVEL - Abnormal; Notable for the following:    Acetaminophen (Tylenol), Serum <10 (*)    All other components within normal limits  ETHANOL  SALICYLATE LEVEL  CBC  RAPID URINE DRUG SCREEN, HOSP PERFORMED    EKG  EKG Interpretation None  Radiology No results found.  Procedures Procedures (including critical care time)  Medications Ordered in ED Medications  acetaminophen (TYLENOL) tablet 650 mg (not administered)  ibuprofen (ADVIL,MOTRIN) tablet 600 mg (600 mg Oral Given 06/12/16 2346)  ondansetron (ZOFRAN) tablet 4 mg (not administered)  alum & mag hydroxide-simeth (MAALOX/MYLANTA) 200-200-20 MG/5ML suspension 30 mL (not administered)  hydrOXYzine (ATARAX/VISTARIL) tablet 25 mg (25 mg Oral Given 06/12/16 2346)  traZODone (DESYREL) tablet 50 mg (50 mg Oral Given 06/13/16 0003)     Initial Impression / Assessment and Plan / ED Course  I have reviewed the triage vital signs and the nursing notes.  Pertinent labs & imaging  results that were available during my care of the patient were reviewed by me and considered in my medical decision making (see chart for details).  Clinical Course     Patient here with anxiety, suicidal ideations. He endorses SI and anxiety in the emergency department. He has been medically cleared for psychiatric evaluation and treatment.  Final Clinical Impressions(s) / ED Diagnoses   Final diagnoses:  None    New Prescriptions New Prescriptions   No medications on file     Tilden FossaElizabeth Shiloh Southern, MD 06/13/16 [id number]
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
## 2016-06-12 NOTE — ED Notes (Signed)
Pt given meal

## 2016-06-12 NOTE — ED Notes (Signed)
TTS in progress 

## 2016-06-12 NOTE — ED Provider Notes (Signed)
[facility] DEPT Provider Note   CSN: [id number] Arrival date & time: 06/12/16  1413  By signing my name below, I, Neta Mends, attest that this documentation has been prepared under the direction and in the presence of Sharlett Iles, MD . Electronically Signed: Neta Mends, ED Scribe. 06/12/2016. 3:41 PM.    History   Chief Complaint Chief Complaint  Patient presents with  . ADHD   The history is provided by the patient. No language interpreter was used.   HPI Comments:  Evan Tanner is a 41 y.o. male who presents to the Emergency Department complaining of worsening ADHD symptoms x 1 month. Pt states that he has been off of his adderall for 1 month, and does not have a provider here. Pt states was previously prescribed adderall by a doctor in [place]. Pt states that he has been having worsening anxiety, racing thoughts, difficulty sleeping. Pt is homeless and reports that he has walked back and forth between [place] because he cannot sleep. Pt has taken Seroquel, ambien, and nyquil to sleep with no relief. Pt denies any illicit drug use or EtOH use. Pt denies other associated symptoms.   Past Medical History:  Diagnosis Date  . Back pain     Patient Active Problem List   Diagnosis Date Noted  . MDD (major depressive disorder), recurrent severe, without psychosis ([place]) 06/12/2016    Past Surgical History:  Procedure Laterality Date  . ANKLE SURGERY    . BACK SURGERY         Home Medications    Prior to Admission medications   Medication Sig Start Date End Date Taking? Authorizing Provider  hydrOXYzine (ATARAX/VISTARIL) 25 MG tablet Take 1 tablet (25 mg total) by mouth every 8 (eight) hours as needed for anxiety. 06/12/16   Sharlett Iles, MD    Family History History reviewed. No pertinent family history.  Social History Social History  Substance Use Topics  . Smoking status: Current Every Day Smoker    Packs/day:  0.50    Years: 15.00    Types: Cigarettes  . Smokeless tobacco: Never Used  . Alcohol use No     Allergies   Patient has no known allergies.   Review of Systems Review of Systems 10 Systems reviewed and are negative for acute change except as noted in the HPI.   Physical Exam Updated Vital Signs BP 115/75 (BP Location: Right Arm)   Pulse 84   Temp 97.7 F (36.5 C) (Oral)   Resp 16   Ht 5' 8"  (1.727 m)   Wt 145 lb (65.8 kg)   SpO2 98%   BMI 22.05 kg/m   Physical Exam  Constitutional: He is oriented to person, place, and time. He appears well-developed and well-nourished. No distress.  HENT:  Head: Normocephalic and atraumatic.  Eyes: Conjunctivae are normal.  Neck: Neck supple.  Cardiovascular: Normal rate and regular rhythm.   No murmur heard. Pulmonary/Chest: Effort normal and breath sounds normal. No respiratory distress.  Neurological: He is alert and oriented to person, place, and time.  Skin: Skin is warm and dry.  Psychiatric:  Mildly anxious, jittery.   Nursing note and vitals reviewed.    ED Treatments / Results  DIAGNOSTIC STUDIES:  Oxygen Saturation is 100% on RA, normal by my interpretation.    COORDINATION OF CARE:  3:41 PM Discussed treatment plan with pt at bedside and pt agreed to plan.   Labs (all labs ordered are listed, but  only abnormal results are displayed) Labs Reviewed - No data to display  EKG  EKG Interpretation None       Radiology No results found.  Procedures Procedures (including critical care time)  Medications Ordered in ED Medications - No data to display   Initial Impression / Assessment and Plan / ED Course  I have reviewed the triage vital signs and the nursing notes.   Clinical Course    PT here requesting Adderall refill. The symptoms he describes are suggestive of anxiety rather than ADHD. I explained that I cannot prescribe Adderall but provided with short course of hydroxyzine to use as needed  for anxiety/sleep. I contacted case management and appreciate Wanda's assistance. She met w/ pt and discussed outpatient resources to get him connected. Pt discharged in satisfactory condition.  Final Clinical Impressions(s) / ED Diagnoses   Final diagnoses:  Anxiety  Insomnia, unspecified type    New Prescriptions New Prescriptions   HYDROXYZINE (ATARAX/VISTARIL) 25 MG TABLET    Take 1 tablet (25 mg total) by mouth every 8 (eight) hours as needed for anxiety.  I personally performed the services described in this documentation, which was scribed in my presence. The recorded information has been reviewed and is accurate.     Sharlett Iles, MD 06/12/16 [phone]

## 2016-06-13 DIAGNOSIS — F4324 Adjustment disorder with disturbance of conduct: Secondary | ICD-10-CM

## 2016-06-13 NOTE — ED Notes (Signed)
Pt discharged ambulatory and walked out by security.  Pt was cursing and belligerent the entire time.  All belongings were returned.  Pt refused to sign anything and refused D/C vitals.

## 2016-06-13 NOTE — ED Notes (Signed)
Pt presents with SI, plan to shoot self with gun or run into traffic.  Feeling hopeless  Denies HI, but seeing visions, no auditory hallucinations.  Pt reports diagnosed with Bipolar DO in past has been off ADHD meds x 1 mos. A&O x 3, no distress noted, calm & cooperative at present.  Monitoring for safety, Q 15 min checks in effect.  Safety check for contraband completed, no items found.

## 2016-06-13 NOTE — ED Notes (Signed)
During rounds patient became belligerent when the doctor told him we dont prescribe ADHD medicine in the ER.  He became very loud insisting on leaving immediately and cursing staff.

## 2016-06-16 ENCOUNTER — Emergency Department (HOSPITAL_COMMUNITY): Payer: Self-pay

## 2016-06-16 ENCOUNTER — Encounter (HOSPITAL_COMMUNITY): Payer: Self-pay | Admitting: Emergency Medicine

## 2016-06-16 ENCOUNTER — Emergency Department (HOSPITAL_COMMUNITY)
Admission: EM | Admit: 2016-06-16 | Discharge: 2016-06-16 | Disposition: A | Discharge status: Home / Self Care | Payer: Self-pay | Attending: Emergency Medicine | Admitting: Emergency Medicine

## 2016-06-16 DIAGNOSIS — F17210 Nicotine dependence, cigarettes, uncomplicated: Secondary | ICD-10-CM | POA: Insufficient documentation

## 2016-06-16 DIAGNOSIS — W19XXXA Unspecified fall, initial encounter: Secondary | ICD-10-CM

## 2016-06-16 DIAGNOSIS — F909 Attention-deficit hyperactivity disorder, unspecified type: Secondary | ICD-10-CM | POA: Insufficient documentation

## 2016-06-16 DIAGNOSIS — Z79899 Other long term (current) drug therapy: Secondary | ICD-10-CM | POA: Insufficient documentation

## 2016-06-16 DIAGNOSIS — M545 Low back pain, unspecified: Secondary | ICD-10-CM

## 2016-06-16 DIAGNOSIS — Y92410 Unspecified street and highway as the place of occurrence of the external cause: Secondary | ICD-10-CM | POA: Insufficient documentation

## 2016-06-16 DIAGNOSIS — M542 Cervicalgia: Secondary | ICD-10-CM | POA: Insufficient documentation

## 2016-06-16 DIAGNOSIS — Y999 Unspecified external cause status: Secondary | ICD-10-CM | POA: Insufficient documentation

## 2016-06-16 DIAGNOSIS — Y9301 Activity, walking, marching and hiking: Secondary | ICD-10-CM | POA: Insufficient documentation

## 2016-06-16 DIAGNOSIS — W010XXA Fall on same level from slipping, tripping and stumbling without subsequent striking against object, initial encounter: Secondary | ICD-10-CM | POA: Insufficient documentation

## 2016-06-16 MED ORDER — ACETAMINOPHEN 500 MG PO TABS
1000.0000 mg | ORAL_TABLET | Freq: Once | ORAL | Status: DC
Start: 2016-06-16 — End: 2016-06-16
  Filled 2016-06-16: qty 2

## 2016-06-16 MED ORDER — IBUPROFEN 200 MG PO TABS
600.0000 mg | ORAL_TABLET | Freq: Once | ORAL | Status: AC
  Administered 2016-06-16: 600 mg via ORAL
  Filled 2016-06-16: qty 3

## 2016-06-16 MED ORDER — METHOCARBAMOL 500 MG PO TABS
1000.0000 mg | ORAL_TABLET | Freq: Once | ORAL | Status: AC
Start: 2016-06-16 — End: 2016-06-16
  Administered 2016-06-16: 1000 mg via ORAL
  Filled 2016-06-16: qty 2

## 2016-06-16 NOTE — ED Provider Notes (Signed)
[facility]-EMERGENCY DEPT Provider Note   CSN: [id number] Arrival date & time: 06/16/16  1420   By signing my name below, I, Avnee Patel, attest that this documentation has been prepared under the direction and in the presence of  [employer], PA-C. Electronically Signed: Clovis Pu, ED Scribe. 06/16/16. 3:21 PM.   History   Chief Complaint Chief Complaint  Patient presents with  . Back Pain    The history is provided by the patient. No language interpreter was used.   HPI Comments:  Evan Tanner is a 41 y.o. male, with a hx of chronic back pain and PSHx of thoracoumbar fusion surgery (T11 to L2) in 2014, who presents to the Emergency Department complaining of sudden onset, right sided back pain, from his right side of his neck down to the right lower back s/p a fall which occurred around 1 PM today. Pt states he was walking along the highway, tripped over a divot in the road and fell onto his back. His pain is worse upon palpation, with certain movements and with breathing. He also notes his pain radiates down his right lower extremity. No alleviating factors noted. Pt denies abdominal pain, LOC, head injury, any other associated symptoms and any other modifying factors at this time. Pt is ambulatory with significant discomfort. MRI of lumbar spine in Dec 2017 showed chronic T12 compression fracture, disc protrusion at L2-L3, and at L5-S1.    Past Medical History:  Diagnosis Date  . ADHD   . Back pain     Patient Active Problem List   Diagnosis Date Noted  . Adjustment disorder with disturbance of conduct 06/13/2016  . MDD (major depressive disorder), recurrent severe, without psychosis (HCC) 06/12/2016    Past Surgical History:  Procedure Laterality Date  . ANKLE SURGERY    . BACK SURGERY         Home Medications    Prior to Admission medications   Medication Sig Start Date End Date Taking? Authorizing Provider  hydrOXYzine (ATARAX/VISTARIL) 25 MG tablet Take 1 tablet (25  mg total) by mouth every 8 (eight) hours as needed for anxiety. 06/12/16   Laurence Spates, MD    Family History History reviewed. No pertinent family history.  Social History Social History  Substance Use Topics  . Smoking status: Current Every Day Smoker    Packs/day: 0.50    Years: 15.00    Types: Cigarettes  . Smokeless tobacco: Never Used  . Alcohol use No     Allergies   Patient has no known allergies.   Review of Systems Review of Systems  Musculoskeletal: Positive for back pain, myalgias and neck pain.  Neurological: Negative for syncope and headaches.   Physical Exam Updated Vital Signs BP 91/72 (BP Location: Right Arm)   Pulse 72   Temp 97.7 F (36.5 C) (Oral)   Resp 16   SpO2 97%   Physical Exam  Constitutional: He is oriented to person, place, and time. He appears well-developed and well-nourished. No distress.  HENT:  Head: Normocephalic and atraumatic.  Eyes: Conjunctivae are normal.  Neck: Normal range of motion.  No midline tenderness. Tenderness over right paracervical muscles  Cardiovascular: Normal rate.   Pulmonary/Chest: Effort normal.  Abdominal: He exhibits no distension.  Musculoskeletal: He exhibits tenderness.  No tenderness midline thoracic or lumbar tenderness. Tenderness over the right posterior ribs and right lateral ribcage  Neurological: He is alert and oriented to person, place, and time.  5/5 strength in all extremities. Reflexes  intact bilaterally. Ambulatory with antalgic gait.   Skin: Skin is warm and dry.  Psychiatric: He has a normal mood and affect.  Nursing note and vitals reviewed.    ED Treatments / Results  DIAGNOSTIC STUDIES:  Oxygen Saturation is 97% on RA, normal by my interpretation.    COORDINATION OF CARE:  3:17 PM Discussed treatment plan with pt at bedside and pt agreed to plan.  Labs (all labs ordered are listed, but only abnormal results are displayed) Labs Reviewed - No data to  display  EKG  EKG Interpretation None       Radiology Dg Ribs Unilateral W/chest Right  Result Date: 06/16/2016 CLINICAL DATA:  Fall. EXAM: RIGHT RIBS AND CHEST - 3+ VIEW COMPARISON:  MRI 05/28/2016. FINDINGS: Mediastinum and hilar structures normal. The lungs are clear. Bilateral nipple shadows noted. Heart size normal. No acute bony abnormality identified. Left posterior eighth old rib fracture noted . Thoracolumbar spine fusion. Hardware intact. IMPRESSION: No acute abnormality. No evidence of displaced rib fracture or pneumothorax. Electronically Signed   By: Maisie Fushomas  Register   On: 06/16/2016 15:54   Dg Thoracic Spine 2 View  Result Date: 06/16/2016 CLINICAL DATA:  Right low back pain.  Tripped and fell. EXAM: THORACIC SPINE 2 VIEWS COMPARISON:  Lumbar spine MRI 05/28/2016. FINDINGS: Posterior hardware partially imaged in the lower thoracic and upper lumbar spine, across a old T12 compression fracture. No hardware complicating feature. No acute bony abnormality. No acute fracture or malalignment. IMPRESSION: Old T12 compression fracture with posterior fusion changes extending from T10 through the visualized upper lumbar spine. No acute bony abnormality. Electronically Signed   By: Charlett NoseKevin  Dover M.D.   On: 06/16/2016 15:53   Dg Lumbar Spine Complete  Result Date: 06/16/2016 CLINICAL DATA:  Tripped while walking down the street, RIGHT lower back pain EXAM: LUMBAR SPINE - COMPLETE 4+ VIEW COMPARISON:  MRI lumbar spine 05/28/2016 FINDINGS: Five non-rib-bearing lumbar vertebra. Prior thoracolumbar spinal fixation T10-L2. Old T12 compression fracture. Vertebral body heights otherwise maintained without fracture or subluxation. No spondylolysis. Endplate spur formation superior endplate L3. Hardware appears intact. SI joints symmetric. IMPRESSION: Old T12 compression fracture with T10-L2 spinal fixation. No acute bony abnormalities. Electronically Signed   By: Ulyses SouthwardMark  Boles M.D.   On: 06/16/2016 15:56     Procedures Procedures (including critical care time)  Medications Ordered in ED Medications  ibuprofen (ADVIL,MOTRIN) tablet 600 mg (600 mg Oral Given 06/16/16 1557)  methocarbamol (ROBAXIN) tablet 1,000 mg (1,000 mg Oral Given 06/16/16 1557)     Initial Impression / Assessment and Plan / ED Course  I have reviewed the triage vital signs and the nursing notes.  Pertinent labs & imaging results that were available during my care of the patient were reviewed by me and considered in my medical decision making (see chart for details).  Clinical Course    Patient with back pain.  No neurological deficits and normal neuro exam.  Patient is ambulatory.  No concern for cauda equina.  Imaging is negative. Supportive care and return precaution discussed. Offered NSAID and muscle relaxer rx however pt states he cannot pay for it. Offered CM consult however he states he does not want to wait. Appears safe for discharge at this time. Follow up as indicated in discharge paperwork.   Final Clinical Impressions(s) / ED Diagnoses   Final diagnoses:  Fall, initial encounter  Neck pain  Acute right-sided low back pain without sciatica    New Prescriptions Discharge Medication List as  of 06/16/2016  5:34 PM     I personally performed the services described in this documentation, which was scribed in my presence. The recorded information has been reviewed and is accurate.     Bethel Born, PA-C 06/17/16 1840    Rolan Bucco, MD 06/20/16 1350

## 2016-06-16 NOTE — ED Notes (Signed)
PT DISCHARGED. PT REFUSED INSTRUCTIONS. AAOX4. PT IN NO APPARENT DISTRESS OR PAIN. THE OPPORTUNITY TO ASK QUESTIONS WAS PROVIDED.

## 2016-06-16 NOTE — ED Triage Notes (Signed)
Per EMS, patient reports tripping while walking down the street and c/o right lower back pain and neck pain. States he "felt something pop" when he fell. Denies head injury and LOC. Ambulatory with EMS.

## 2016-06-19 ENCOUNTER — Emergency Department (HOSPITAL_COMMUNITY)
Admission: EM | Admit: 2016-06-19 | Discharge: 2016-06-20 | Disposition: A | Discharge status: Home / Self Care | Payer: Self-pay | Attending: Emergency Medicine | Admitting: Emergency Medicine

## 2016-06-19 ENCOUNTER — Emergency Department (HOSPITAL_COMMUNITY): Payer: Self-pay

## 2016-06-19 ENCOUNTER — Encounter (HOSPITAL_COMMUNITY): Payer: Self-pay

## 2016-06-19 DIAGNOSIS — Z5181 Encounter for therapeutic drug level monitoring: Secondary | ICD-10-CM | POA: Insufficient documentation

## 2016-06-19 DIAGNOSIS — R51 Headache: Secondary | ICD-10-CM | POA: Insufficient documentation

## 2016-06-19 DIAGNOSIS — F909 Attention-deficit hyperactivity disorder, unspecified type: Secondary | ICD-10-CM | POA: Insufficient documentation

## 2016-06-19 DIAGNOSIS — F332 Major depressive disorder, recurrent severe without psychotic features: Secondary | ICD-10-CM | POA: Diagnosis present

## 2016-06-19 DIAGNOSIS — R519 Headache, unspecified: Secondary | ICD-10-CM

## 2016-06-19 DIAGNOSIS — R45851 Suicidal ideations: Secondary | ICD-10-CM | POA: Insufficient documentation

## 2016-06-19 DIAGNOSIS — F17210 Nicotine dependence, cigarettes, uncomplicated: Secondary | ICD-10-CM | POA: Insufficient documentation

## 2016-06-19 LAB — COMPREHENSIVE METABOLIC PANEL
ALBUMIN: 4 g/dL (ref 3.5–5.0)
ALK PHOS: 69 U/L (ref 38–126)
ALT: 15 U/L — AB (ref 17–63)
ANION GAP: 7 (ref 5–15)
AST: 20 U/L (ref 15–41)
BUN: 10 mg/dL (ref 6–20)
CALCIUM: 9.2 mg/dL (ref 8.9–10.3)
CO2: 26 mmol/L (ref 22–32)
Chloride: 105 mmol/L (ref 101–111)
Creatinine, Ser: 0.9 mg/dL (ref 0.61–1.24)
GFR calc Af Amer: >60 mL/min (ref >60)
GFR calc non Af Amer: >60 mL/min (ref >60)
GLUCOSE: 92 mg/dL (ref 65–99)
Potassium: 4.2 mmol/L (ref 3.5–5.1)
SODIUM: 138 mmol/L (ref 135–145)
Total Bilirubin: 0.9 mg/dL (ref 0.3–1.2)
Total Protein: 6.5 g/dL (ref 6.5–8.1)

## 2016-06-19 LAB — RAPID URINE DRUG SCREEN, HOSP PERFORMED
AMPHETAMINES: NOT DETECTED
BENZODIAZEPINES: NOT DETECTED
Barbiturates: NOT DETECTED
COCAINE: POSITIVE — AB
Opiates: NOT DETECTED
Tetrahydrocannabinol: NOT DETECTED

## 2016-06-19 LAB — ACETAMINOPHEN LEVEL

## 2016-06-19 LAB — CBC
HEMATOCRIT: 40 % (ref 39.0–52.0)
Hemoglobin: 14.4 g/dL (ref 13.0–17.0)
MCH: 33 pg (ref 26.0–34.0)
MCHC: 36 g/dL (ref 30.0–36.0)
MCV: 91.7 fL (ref 78.0–100.0)
Platelets: 259 10*3/uL (ref 150–400)
RBC: 4.36 MIL/uL (ref 4.22–5.81)
RDW: 12.4 % (ref 11.5–15.5)
WBC: 7.6 10*3/uL (ref 4.0–10.5)

## 2016-06-19 LAB — SALICYLATE LEVEL: Salicylate Lvl: <7 mg/dL (ref 2.8–30.0)

## 2016-06-19 LAB — ETHANOL: Alcohol, Ethyl (B): <5 mg/dL (ref <5)

## 2016-06-19 MED ORDER — ALUM & MAG HYDROXIDE-SIMETH 200-200-20 MG/5ML PO SUSP: 30.0000 mL | ORAL | Status: DC | PRN

## 2016-06-19 MED ORDER — ACETAMINOPHEN 325 MG PO TABS: 650.0000 mg | ORAL_TABLET | ORAL | Status: DC | PRN

## 2016-06-19 MED ORDER — IBUPROFEN 400 MG PO TABS: 600.0000 mg | ORAL_TABLET | Freq: Three times a day (TID) | ORAL | Status: DC | PRN

## 2016-06-19 MED ORDER — ACETAMINOPHEN 325 MG PO TABS
650.0000 mg | ORAL_TABLET | Freq: Once | ORAL | Status: AC
  Administered 2016-06-19: 650 mg via ORAL
  Filled 2016-06-19: qty 2

## 2016-06-19 MED ORDER — LORAZEPAM 1 MG PO TABS
1.0000 mg | ORAL_TABLET | Freq: Three times a day (TID) | ORAL | Status: DC | PRN
  Administered 2016-06-19: 1 mg via ORAL
  Filled 2016-06-19: qty 1

## 2016-06-19 MED ORDER — ONDANSETRON 4 MG PO TBDP
4.0000 mg | ORAL_TABLET | Freq: Once | ORAL | Status: AC
  Administered 2016-06-19: 4 mg via ORAL
  Filled 2016-06-19: qty 1

## 2016-06-19 MED ORDER — NICOTINE 21 MG/24HR TD PT24
21.0000 mg | MEDICATED_PATCH | Freq: Every day | TRANSDERMAL | Status: DC
  Administered 2016-06-19 – 2016-06-20 (×2): 21 mg via TRANSDERMAL
  Filled 2016-06-19 (×2): qty 1

## 2016-06-19 MED ORDER — ONDANSETRON HCL 4 MG PO TABS: 4.0000 mg | ORAL_TABLET | Freq: Three times a day (TID) | ORAL | Status: DC | PRN

## 2016-06-19 NOTE — ED Notes (Signed)
Security paged, scrubs given to patient and sitter at bedside
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
## 2016-06-19 NOTE — ED Triage Notes (Signed)
Pt. Also states that he has many plans to harm himself he just cant figure out which one to carry out

## 2016-06-19 NOTE — ED Triage Notes (Signed)
Pt. sts he has a headache after drinking 1 beer that he took from another person and is afraid that the beer might have had something in it.

## 2016-06-19 NOTE — ED Notes (Signed)
Pt given Mal[place] sandwich snack bag and coke.

## 2016-06-19 NOTE — ED Provider Notes (Signed)
[facility]-EMERGENCY DEPT Provider Note   CSN: [id number] Arrival date & time: 06/19/16  1817     History   Chief Complaint Chief Complaint  Patient presents with  . Headache  . Suicidal    HPI Evan Tanner is a 41 y.o. male.  Evan Tanner is a 41 y.o. Male who presents to the ED complaining of a bad headache after splitting a beer earlier today. Patient reports he split a beer with someone at a bar around 3 PM today. He reports afterwards he developed a bad headache. He reports his headache is generalized and he has associated nausea, photophobia, vomiting and lightheadedness. Patient has taken nothing for treatment of his symptoms. Patient also reports he is suicidal and has "lots of plans." Patient cannot provide any specific details about his plan to kill himself. Patient denies fevers, double vision, neck pain, abdominal pain, diarrhea, chest pain, coughing, shortness of breath, numbness, tingling, weakness, new back pain, eye pain or rashes. He denies thunderclap headache.    The history is provided by the patient. No language interpreter was used.  Headache   Pertinent negatives include no fever, no shortness of breath, no nausea and no vomiting.    Past Medical History:  Diagnosis Date  . ADHD   . Back pain     Patient Active Problem List   Diagnosis Date Noted  . Adjustment disorder with disturbance of conduct 06/13/2016  . MDD (major depressive disorder), recurrent severe, without psychosis (HCC) 06/12/2016    Past Surgical History:  Procedure Laterality Date  . ANKLE SURGERY    . BACK SURGERY         Home Medications    Prior to Admission medications   Medication Sig Start Date End Date Taking? Authorizing Provider  hydrOXYzine (ATARAX/VISTARIL) 25 MG tablet Take 1 tablet (25 mg total) by mouth every 8 (eight) hours as needed for anxiety. 06/12/16   Laurence Spates, MD    Family History No family history on file.  Social History Social History    Substance Use Topics  . Smoking status: Current Every Day Smoker    Packs/day: 0.50    Years: 15.00    Types: Cigarettes  . Smokeless tobacco: Never Used  . Alcohol use Yes     Allergies   Patient has no known allergies.   Review of Systems Review of Systems  Constitutional: Negative for chills and fever.  HENT: Negative for congestion and sore throat.   Eyes: Positive for photophobia. Negative for pain and visual disturbance.  Respiratory: Negative for cough, shortness of breath and wheezing.   Cardiovascular: Negative for chest pain.  Gastrointestinal: Negative for abdominal pain, diarrhea, nausea and vomiting.  Genitourinary: Negative for dysuria.  Musculoskeletal: Negative for back pain, neck pain and neck stiffness.  Skin: Negative for rash.  Neurological: Positive for light-headedness and headaches. Negative for dizziness, syncope, speech difficulty, weakness and numbness.  Psychiatric/Behavioral: Positive for dysphoric mood and suicidal ideas.     Physical Exam Updated Vital Signs BP 115/81   Pulse 71   Temp 99.6 F (37.6 C) (Oral)   Resp 17   SpO2 98%   Physical Exam  Constitutional: He is oriented to person, place, and time. He appears well-developed and well-nourished. No distress.  Nontoxic appearing.  HENT:  Head: Normocephalic and atraumatic.  Right Ear: External ear normal.  Left Ear: External ear normal.  Mouth/Throat: Oropharynx is clear and moist.  Bilateral tympanic membranes are pearly-gray without erythema or loss of landmarks.  No temporal edema or TTP.   Eyes: Conjunctivae and EOM are normal. Pupils are equal, round, and reactive to light. Right eye exhibits no discharge. Left eye exhibits no discharge.  Neck: Normal range of motion. Neck supple. No JVD present.  Cardiovascular: Normal rate, regular rhythm, normal heart sounds and intact distal pulses.  Exam reveals no gallop and no friction rub.   No murmur heard. Pulmonary/Chest: Effort  normal and breath sounds normal. No stridor. No respiratory distress. He has no wheezes. He has no rales.  Abdominal: Soft. He exhibits no mass. There is no tenderness. There is no guarding.  Abdomen soft and nontender palpation.  Musculoskeletal: Normal range of motion. He exhibits no edema or tenderness.  Lymphadenopathy:    He has no cervical adenopathy.  Neurological: He is alert and oriented to person, place, and time. No cranial nerve deficit or sensory deficit. He exhibits normal muscle tone. Coordination normal.  Patient is alert and oriented 3. Cranial nerves are intact. Speech is clear and coherent. No pronator drift. Finger to nose intact bilaterally. Sensation is intact in his bilateral upper and lower extremities. Normal gait.  Skin: Skin is warm and dry. Capillary refill takes less than 2 seconds. No rash noted. He is not diaphoretic. No erythema. No pallor.  Psychiatric: His behavior is normal. He exhibits a depressed mood. He expresses suicidal ideation. He expresses no homicidal ideation.  Patient reports suicidal ideations without specific plan.   Nursing note and vitals reviewed.    ED Treatments / Results  Labs (all labs ordered are listed, but only abnormal results are displayed) Labs Reviewed  COMPREHENSIVE METABOLIC PANEL - Abnormal; Notable for the following:       Result Value   ALT 15 (*)    All other components within normal limits  ACETAMINOPHEN LEVEL - Abnormal; Notable for the following:    Acetaminophen (Tylenol), Serum <10 (*)    All other components within normal limits  RAPID URINE DRUG SCREEN, HOSP PERFORMED - Abnormal; Notable for the following:    Cocaine POSITIVE (*)    All other components within normal limits  ETHANOL  SALICYLATE LEVEL  CBC    EKG  EKG Interpretation None       Radiology Ct Head Wo Contrast  Result Date: 06/19/2016 CLINICAL DATA:  Headache with lightheadedness nausea and vomiting EXAM: CT HEAD WITHOUT CONTRAST  TECHNIQUE: Contiguous axial images were obtained from the base of the skull through the vertex without intravenous contrast. COMPARISON:  None. FINDINGS: Brain: No evidence of acute infarction, hemorrhage, hydrocephalus, extra-axial collection or mass lesion/mass effect. Asymmetric prominence of the left lateral ventricle. Vascular: No hyperdense vessel or unexpected calcification. Skull: Normal. Negative for fracture or focal lesion. Sinuses/Orbits: Mucosal thickening in the sphenoid, ethmoid and maxillary sinuses. No acute orbital abnormality. Other: None IMPRESSION: No CT evidence for acute intracranial abnormality Electronically Signed   By: Jasmine Pang M.D.   On: 06/19/2016 19:49    Procedures Procedures (including critical care time)  Medications Ordered in ED Medications  alum & mag hydroxide-simeth (MAALOX/MYLANTA) 200-200-20 MG/5ML suspension 30 mL (not administered)  ondansetron (ZOFRAN) tablet 4 mg (not administered)  nicotine (NICODERM CQ - dosed in mg/24 hours) patch 21 mg (not administered)  ibuprofen (ADVIL,MOTRIN) tablet 600 mg (not administered)  acetaminophen (TYLENOL) tablet 650 mg (not administered)  LORazepam (ATIVAN) tablet 1 mg (not administered)  acetaminophen (TYLENOL) tablet 650 mg (650 mg Oral Given 06/19/16 2108)  ondansetron (ZOFRAN-ODT) disintegrating tablet 4 mg (4 mg Oral  Given 06/19/16 2108)     Initial Impression / Assessment and Plan / ED Course  I have reviewed the triage vital signs and the nursing notes.  Pertinent labs & imaging results that were available during my care of the patient were reviewed by me and considered in my medical decision making (see chart for details).  Clinical Course    This is a 41 y.o. Male who presents to the ED complaining of a bad headache after splitting a beer earlier today. Patient reports he split a beer with someone at a bar around 3 PM today. He reports afterwards he developed a bad headache. He reports his headache  is generalized and he has associated nausea, photophobia, vomiting and lightheadedness. Patient has taken nothing for treatment of his symptoms. Patient also reports he is suicidal and has "lots of plans." Patient cannot provide any specific details about his plan to kill himself. On exam the patient is afebrile nontoxic appearing. He has no focal neurological deficits and has normal gait. He endorses suicidal ideations without any specific plan. Head CT is unremarkable. Blood work is unremarkable. Urine drug screen is positive for cocaine. Patient denies cocaine use. He tells me he thinks it was and appeared that he drank. I reevaluation patient reports his headache is much better. He is tolerated sandwich and applesauce. Behavioral health evaluate the patient and would like psychiatry did evaluate him in the morning. Will place in psych hold. Patient is voluntary and agreeable.  Patient is medically clear for Behavioral Health disposition.  Final Clinical Impressions(s) / ED Diagnoses   Final diagnoses:  Suicidal ideation  Bad headache    New Prescriptions New Prescriptions   No medications on file     Everlene FarrierWilliam Normajean Nash, Cordelia Poche-C 06/19/16 2224    Marily MemosJason Mesner, MD 06/23/16 [phone]

## 2016-06-19 NOTE — ED Notes (Signed)
Pt reports feeling like his thoughts are switching back and forth and that he can't focus.  Sts his mind feels jumbled.

## 2016-06-20 ENCOUNTER — Emergency Department (HOSPITAL_COMMUNITY)
Admission: EM | Admit: 2016-06-20 | Discharge: 2016-06-21 | Disposition: A | Discharge status: Home / Self Care | Payer: Self-pay | Attending: Emergency Medicine | Admitting: Emergency Medicine

## 2016-06-20 ENCOUNTER — Encounter (HOSPITAL_COMMUNITY): Payer: Self-pay

## 2016-06-20 DIAGNOSIS — F17210 Nicotine dependence, cigarettes, uncomplicated: Secondary | ICD-10-CM | POA: Insufficient documentation

## 2016-06-20 DIAGNOSIS — F1414 Cocaine abuse with cocaine-induced mood disorder: Secondary | ICD-10-CM | POA: Insufficient documentation

## 2016-06-20 DIAGNOSIS — F909 Attention-deficit hyperactivity disorder, unspecified type: Secondary | ICD-10-CM | POA: Insufficient documentation

## 2016-06-20 DIAGNOSIS — Z9889 Other specified postprocedural states: Secondary | ICD-10-CM

## 2016-06-20 DIAGNOSIS — R45851 Suicidal ideations: Secondary | ICD-10-CM | POA: Insufficient documentation

## 2016-06-20 DIAGNOSIS — F332 Major depressive disorder, recurrent severe without psychotic features: Secondary | ICD-10-CM

## 2016-06-20 DIAGNOSIS — Z79899 Other long term (current) drug therapy: Secondary | ICD-10-CM | POA: Insufficient documentation

## 2016-06-20 LAB — RAPID URINE DRUG SCREEN, HOSP PERFORMED
Amphetamines: NOT DETECTED
Barbiturates: NOT DETECTED
Benzodiazepines: NOT DETECTED
COCAINE: POSITIVE — AB
OPIATES: NOT DETECTED
TETRAHYDROCANNABINOL: NOT DETECTED

## 2016-06-20 LAB — CBC
HCT: 43.4 % (ref 39.0–52.0)
HEMOGLOBIN: 15.7 g/dL (ref 13.0–17.0)
MCH: 32.9 pg (ref 26.0–34.0)
MCHC: 36.2 g/dL — AB (ref 30.0–36.0)
MCV: 91 fL (ref 78.0–100.0)
PLATELETS: 285 10*3/uL (ref 150–400)
RBC: 4.77 MIL/uL (ref 4.22–5.81)
RDW: 12.4 % (ref 11.5–15.5)
WBC: 9.8 10*3/uL (ref 4.0–10.5)

## 2016-06-20 LAB — COMPREHENSIVE METABOLIC PANEL
ALT: 16 U/L — ABNORMAL LOW (ref 17–63)
AST: 28 U/L (ref 15–41)
Albumin: 4.9 g/dL (ref 3.5–5.0)
Alkaline Phosphatase: 76 U/L (ref 38–126)
Anion gap: 14 (ref 5–15)
BUN: 12 mg/dL (ref 6–20)
CALCIUM: 9.3 mg/dL (ref 8.9–10.3)
CHLORIDE: 106 mmol/L (ref 101–111)
CO2: 21 mmol/L — AB (ref 22–32)
CREATININE: 0.98 mg/dL (ref 0.61–1.24)
Glucose, Bld: 96 mg/dL (ref 65–99)
Potassium: 3.5 mmol/L (ref 3.5–5.1)
SODIUM: 141 mmol/L (ref 135–145)
Total Bilirubin: 0.9 mg/dL (ref 0.3–1.2)
Total Protein: 7.8 g/dL (ref 6.5–8.1)

## 2016-06-20 LAB — ACETAMINOPHEN LEVEL

## 2016-06-20 LAB — ETHANOL: Alcohol, Ethyl (B): <5 mg/dL (ref <5)

## 2016-06-20 LAB — SALICYLATE LEVEL

## 2016-06-20 MED ORDER — HYDROXYZINE HCL 25 MG PO TABS: 50.0000 mg | ORAL_TABLET | Freq: Three times a day (TID) | ORAL | 0 refills | Status: AC

## 2016-06-20 NOTE — ED Notes (Signed)
Pt eating breakfast 

## 2016-06-20 NOTE — ED Notes (Signed)
Regular Diet ordered for patient.   

## 2016-06-20 NOTE — ED Notes (Signed)
Patient was given a snack and drink, A regular Lunch order was taken.
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
## 2016-06-20 NOTE — ED Notes (Signed)
Patient states he feels anxious and is starting to get a headache
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
## 2016-06-20 NOTE — ED Notes (Signed)
SBAR Report received from previous nurse. Pt received calm and visible on unit. Pt denies current SI/ HI, A/V H, depression, anxiety, or pain at this time, and appears otherwise stable and free of distress. Pt reminded of camera surveillance, q 15 min rounds, and rules of the milieu. Will continue to assess. 
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
## 2016-06-20 NOTE — Consult Note (Signed)
Telepsych Consultation   Reason for Consult:  Suicidal statements yesterday Referring Physician:  EDP Patient Identification: Evan Tanner MRN:  [id number] Principal Diagnosis: MDD (major depressive disorder), recurrent severe, without psychosis ([facility]) Diagnosis:   Patient Active Problem List   Diagnosis Date Noted  . MDD (major depressive disorder), recurrent severe, without psychosis ([facility] [place]) [F33.2] 06/12/2016    Priority: High    Total Time spent with patient: 30 minutes  Subjective:   Evan Tanner is a 41 y.o. male patient admitted with reports of suicidal statements yesterday. Pt seen and chart reviewed. Pt is alert/oriented x4, calm, cooperative, and appropriate to situation.   Today, pt denies suicidal/homicidal ideation and psychosis and does not appear to be responding to internal stimuli.   Pt reports that he feels if he could get on a stable medication regimen that would help him with his racing thoughts, he would be less likely to abuse drugs/alcohol and that he would be able to "get my life together." Pt does not meet inpatient criteria at this time but would benefit from outpatient treatment.   HPI:  I have reviewed and concur with HPI elements below, modified as follows:  "Clinician reviewed note by Waynetta Pean, PA-C.  Evan Tanner is a 41 y.o. Male who presents to the ED complaining of a bad headache after splitting a beer earlier today. Patient reports he split a beer with someone at a bar around 3 PM today. He reports afterwards he developed a bad headache.  Patient also reports he is suicidal and has "lots of plans." Patient cannot provide any specific details about his plan to kill himself.  Patient says that he had been sharing a beer in a bar with someone he did not know.  He started to have the bad headache symptoms then went to a local police station where he told them he was not feeling right.  They called EMS and patient was transported to [facility].    Patient says  that he gets very frustrated at times because of being homeless and not having resources to get mental health help.  Patient says that at times he wants to harm other people "that don't even deserve it."  Patient says he gets anxious and worried around other people and this sometimes affects him when he is staying at the [employer].  Patient says "I may as well just walk on the highway until I'm lucky enough to get hit by a car."  Patient at first was uncertain of what he could do to kill himself.  Patient has no previous suicide attempts. Patient denies any A/V hallucinations.  He does say that he often will hear voices of people from the past in his thoughts, not as a distinct other voice.  Patient says he has vivid and scary nightmares.  Patient is at times having difficulty sleeping due to these nightmares.  Patient says he drinks two 24oz beers usually 2-3 times in a week.  He denies cocaine use but it is on his UDS.  Patient says that he has been clean from cocaine for 3 years.  Patient surmises that it may have been in the beer that he drank.  Patient says that he has been in [place] from [place] for about 2 weeks.  His sister died of an heroin overdose 1.5 years ago.  Patient has no family supports and his homeless."  Pt spent the night in the ED without incident. Seen as above for evaluation today on 06/12/16 and  again today, only 8 days later.   Past Psychiatric History: depression  Risk to Self: Suicidal Ideation: NO Suicidal Intent: Yes-NO Is patient at risk for suicide?: NO Suicidal Plan?: NO Specify Current Suicidal Plan: NO Access to Means: NO Specify Access to Suicidal Means: NO.  What has been your use of drugs/alcohol within the last 12 months?: Cigarettes. How many times?: 3 Other Self Harm Risks: Pt denies.  Triggers for Past Attempts: Unpredictable Intentional Self Injurious Behavior: None (Pt denies. ) Risk to Others: Homicidal Ideation: No Thoughts of Harm to  Others: Yes-Currently Present Comment - Thoughts of Harm to Others: Harming those that frustrate him. Current Homicidal Intent: No Current Homicidal Plan: No Access to Homicidal Means: No Identified Victim: None History of harm to others?: Yes Assessment of Violence: In distant past Violent Behavior Description: "It has been a few years." Does patient have access to weapons?: No ("Anything can be used as a weapon.") Criminal Charges Pending?: No Does patient have a court date: No Prior Inpatient Therapy: Prior Inpatient Therapy: No Prior Therapy Dates: NA Prior Therapy Facilty/Provider(s): NA Reason for Treatment: NA Prior Outpatient Therapy: Prior Outpatient Therapy: No Prior Therapy Dates: NA Prior Therapy Facilty/Provider(s): NA Reason for Treatment: NA Does patient have an ACCT team?: No Does patient have Intensive In-House Services?  : No Does patient have [facility] services? : No Does patient have P4CC services?: No  Past Medical History:  Past Medical History:  Diagnosis Date  . ADHD   . Back pain     Past Surgical History:  Procedure Laterality Date  . ANKLE SURGERY    . BACK SURGERY     Family History: No family history on file. Family Psychiatric  History: denies Social History:  History  Alcohol Use  . Yes     History  Drug Use No    Social History   Social History  . Marital status: Single    Spouse name: N/A  . Number of children: N/A  . Years of education: N/A   Social History Main Topics  . Smoking status: Current Every Day Smoker    Packs/day: 0.50    Years: 15.00    Types: Cigarettes  . Smokeless tobacco: Never Used  . Alcohol use Yes  . Drug use: No  . Sexual activity: Not Asked   Other Topics Concern  . None   Social History Narrative  . None   Additional Social History:    Allergies:  No Known Allergies  Labs:  Results for orders placed or performed during the hospital encounter of 06/19/16 (from the past 48 hour(s))   Rapid urine drug screen (hospital performed)     Status: Abnormal   Collection Time: 06/19/16  6:30 PM  Result Value Ref Range   Opiates NONE DETECTED NONE DETECTED   Cocaine POSITIVE (A) NONE DETECTED   Benzodiazepines NONE DETECTED NONE DETECTED   Amphetamines NONE DETECTED NONE DETECTED   Tetrahydrocannabinol NONE DETECTED NONE DETECTED   Barbiturates NONE DETECTED NONE DETECTED    Comment:        DRUG SCREEN FOR MEDICAL PURPOSES ONLY.  IF CONFIRMATION IS NEEDED FOR ANY PURPOSE, NOTIFY LAB WITHIN 5 DAYS.        LOWEST DETECTABLE LIMITS FOR URINE DRUG SCREEN Drug Class       Cutoff (ng/mL) Amphetamine      1000 Barbiturate      200 Benzodiazepine   031 Tricyclics       594 Opiates  300 Cocaine          300 THC              50   Comprehensive metabolic panel     Status: Abnormal   Collection Time: 06/19/16  7:02 PM  Result Value Ref Range   Sodium 138 135 - 145 mmol/L   Potassium 4.2 3.5 - 5.1 mmol/L   Chloride 105 101 - 111 mmol/L   CO2 26 22 - 32 mmol/L   Glucose, Bld 92 65 - 99 mg/dL   BUN 10 6 - 20 mg/dL   Creatinine, Ser 0.90 0.61 - 1.24 mg/dL   Calcium 9.2 8.9 - 10.3 mg/dL   Total Protein 6.5 6.5 - 8.1 g/dL   Albumin 4.0 3.5 - 5.0 g/dL   AST 20 15 - 41 U/L   ALT 15 (L) 17 - 63 U/L   Alkaline Phosphatase 69 38 - 126 U/L   Total Bilirubin 0.9 0.3 - 1.2 mg/dL   GFR calc non Af Amer >60 >60 mL/min   GFR calc Af Amer >60 >60 mL/min    Comment: (NOTE) The eGFR has been calculated using the CKD EPI equation. This calculation has not been validated in all clinical situations. eGFR's persistently <60 mL/min signify possible Chronic Kidney Disease.    Anion gap 7 5 - 15  Ethanol     Status: None   Collection Time: 06/19/16  7:02 PM  Result Value Ref Range   Alcohol, Ethyl (B) <5 <5 mg/dL    Comment:        LOWEST DETECTABLE LIMIT FOR SERUM ALCOHOL IS 5 mg/dL FOR MEDICAL PURPOSES ONLY   Salicylate level     Status: None   Collection Time: 06/19/16   7:02 PM  Result Value Ref Range   Salicylate Lvl <2.4 2.8 - 30.0 mg/dL  Acetaminophen level     Status: Abnormal   Collection Time: 06/19/16  7:02 PM  Result Value Ref Range   Acetaminophen (Tylenol), Serum <10 (L) 10 - 30 ug/mL    Comment:        THERAPEUTIC CONCENTRATIONS VARY SIGNIFICANTLY. A RANGE OF 10-30 ug/mL MAY BE AN EFFECTIVE CONCENTRATION FOR MANY PATIENTS. HOWEVER, SOME ARE BEST TREATED AT CONCENTRATIONS OUTSIDE THIS RANGE. ACETAMINOPHEN CONCENTRATIONS >150 ug/mL AT 4 HOURS AFTER INGESTION AND >50 ug/mL AT 12 HOURS AFTER INGESTION ARE OFTEN ASSOCIATED WITH TOXIC REACTIONS.   cbc     Status: None   Collection Time: 06/19/16  7:02 PM  Result Value Ref Range   WBC 7.6 4.0 - 10.5 K/uL   RBC 4.36 4.22 - 5.81 MIL/uL   Hemoglobin 14.4 13.0 - 17.0 g/dL   HCT 40.0 39.0 - 52.0 %   MCV 91.7 78.0 - 100.0 fL   MCH 33.0 26.0 - 34.0 pg   MCHC 36.0 30.0 - 36.0 g/dL   RDW 12.4 11.5 - 15.5 %   Platelets 259 150 - 400 K/uL    Current Facility-Administered Medications  Medication Dose Route Frequency Provider Last Rate Last Dose  . acetaminophen (TYLENOL) tablet 650 mg  650 mg Oral Q4H PRN Waynetta Pean, PA-C      . alum & mag hydroxide-simeth (MAALOX/MYLANTA) 200-200-20 MG/5ML suspension 30 mL  30 mL Oral PRN Waynetta Pean, PA-C      . ibuprofen (ADVIL,MOTRIN) tablet 600 mg  600 mg Oral Q8H PRN Waynetta Pean, PA-C      . LORazepam (ATIVAN) tablet 1 mg  1 mg Oral Q8H PRN Waynetta Pean,  PA-C   1 mg at 06/19/16 2340  . nicotine (NICODERM CQ - dosed in mg/24 hours) patch 21 mg  21 mg Transdermal Daily Waynetta Pean, PA-C   21 mg at 06/20/16 0942  . ondansetron (ZOFRAN) tablet 4 mg  4 mg Oral Q8H PRN Waynetta Pean, PA-C       Current Outpatient Prescriptions  Medication Sig Dispense Refill  . hydrOXYzine (ATARAX/VISTARIL) 25 MG tablet Take 1 tablet (25 mg total) by mouth every 8 (eight) hours as needed for anxiety. (Patient not taking: Reported on 06/19/2016) 8 tablet 0     Musculoskeletal: Strength & Muscle Tone: within normal limits Gait & Station: normal  Patient leans: N/A  Psychiatric Specialty Exam: Physical Exam  Review of Systems  Psychiatric/Behavioral: Positive for depression and substance abuse. Negative for hallucinations and suicidal ideas. The patient is nervous/anxious and has insomnia.   All other systems reviewed and are negative.   Blood pressure 122/73, pulse 68, temperature 98.5 F (36.9 C), temperature source Oral, resp. rate 16, SpO2 99 %.There is no height or weight on file to calculate BMI.  General Appearance: Casual and Fairly Groomed  Eye Contact:  Good  Speech:  Clear and Coherent and Normal Rate  Volume:  Normal  Mood:  Euthymic  Affect:  Appropriate and Congruent  Thought Process:  Coherent, Goal Directed, Linear and Descriptions of Associations: Intact  Orientation:  Full (Time, Place, and Person)  Thought Content:  Focused on medication management, drug use  Suicidal Thoughts:  No  Homicidal Thoughts:  No  Memory:  Immediate;   Fair Recent;   Fair Remote;   Fair  Judgement:  Fair  Insight:  Fair  Psychomotor Activity:  Normal  Concentration:  Concentration: Fair and Attention Span: Fair  Recall:  [employer] of Knowledge:  Fair  Language:  Fair  Akathisia:  No  Handed:    AIMS (if indicated):     Assets:  Communication Skills Desire for Improvement Resilience Social Support  ADL's:  Intact  Cognition:  WNL  Sleep:      Treatment Plan Summary: MDD (major depressive disorder), recurrent severe, without psychosis ([facility]) stable for outpatient management  Disposition: No evidence of imminent risk to self or others at present.   Patient does not meet criteria for psychiatric inpatient admission. Supportive therapy provided about ongoing stressors. Discussed crisis plan, support from social network, calling 911, coming to the Emergency Department, and calling Suicide Hotline.  -Vistaril 56m po tid prn  anxiety #90 (please give) -Give pt resources so that he can get on stronger medication for racing thoughts/mood lability  WBenjamine Mola FNP 06/20/2016 10:18 AM

## 2016-06-20 NOTE — ED Provider Notes (Signed)
- [facility]-EMERGENCY DEPT Provider Note   CSN: [id number] Arrival date & time: 06/20/16  2009 By signing my name below, I, Evan Tanner, attest that this documentation has been prepared under the direction and in the presence of non-physician practitioner, Shanna CiscoJamie Jossette Zirbel, PA-C. Electronically Signed: Levon HedgerElizabeth Tanner, Scribe. 06/20/2016. 9:21 PM.   History   Chief Complaint Chief Complaint  Patient presents with  . Suicidal   HPI Evan Tanner is a 41 y.o. male with a hx of ADD and recurrent severe MDD who presents to the Emergency Department complaining of progressively worsening, frequent suicidal ideation onset four to five days ago. He notes associated difficulty sleeping, auditory hallucinations and racing thoughts. Pt reports that he was running though traffic on the highway so he would get hit by a car and make the voices stop. Pt describes his auditory hallucinations as voices in his head which tell him to obtain a gun and shoot himself. Pt states he has experienced this before, but never this severe. Per pt, the symptoms resolved on their own without medication or hospitalization. Per chart review, pt has been seen in the ED four times this month for similar complaints. Pt is homeless after leaving [place] 1 month ago and has not been able to sleep, eat or drink in several days. He denies any alcohol or illicit drug use.   The history is provided by the patient. No language interpreter was used.    Past Medical History:  Diagnosis Date  . ADHD   . Back pain     Patient Active Problem List   Diagnosis Date Noted  . MDD (major depressive disorder), recurrent severe, without psychosis ([facility]) 06/12/2016    Past Surgical History:  Procedure Laterality Date  . ANKLE SURGERY    . BACK SURGERY       Home Medications    Prior to Admission medications   Medication Sig Start Date End Date Taking? Authorizing Provider  hydrOXYzine (ATARAX/VISTARIL) 25 MG tablet Take 1 tablet (25 mg  total) by mouth every 8 (eight) hours as needed for anxiety. Patient not taking: Reported on 06/19/2016 06/12/16   Laurence Spatesachel Morgan Little, MD  hydrOXYzine (ATARAX/VISTARIL) 25 MG tablet Take 2 tablets (50 mg total) by mouth 3 (three) times daily. 06/20/16   Arby BarretteMarcy Pfeiffer, MD    Family History History reviewed. No pertinent family history.  Social History Social History  Substance Use Topics  . Smoking status: Current Every Day Smoker    Packs/day: 0.50    Years: 15.00    Types: Cigarettes  . Smokeless tobacco: Never Used  . Alcohol use Yes    Allergies   Patient has no known allergies.   Review of Systems Review of Systems  Psychiatric/Behavioral: Positive for hallucinations, sleep disturbance and suicidal ideas.   10 systems reviewed and all are negative for acute change except as noted in the HPI.   Physical Exam Updated Vital Signs BP 131/67 (BP Location: Left Arm)   Pulse 78   Temp 97.8 F (36.6 C) (Oral)   Resp 20   SpO2 100%   Physical Exam  Constitutional: He is oriented to person, place, and time. He appears well-developed and well-nourished. No distress.  HENT:  Head: Normocephalic and atraumatic.  Cardiovascular: Normal rate, regular rhythm and normal heart sounds.   No murmur heard. Pulmonary/Chest: Effort normal and breath sounds normal. No respiratory distress.  Abdominal: Soft. He exhibits no distension. There is no tenderness.  Musculoskeletal: He exhibits no edema.  Neurological: He is  alert and oriented to person, place, and time.  Skin: Skin is warm and dry. Rash:   Nursing note and vitals reviewed.  ED Treatments / Results  DIAGNOSTIC STUDIES:  Oxygen Saturation is 100% on RA, normal by my interpretation.    COORDINATION OF CARE:  9:17 PM Discussed treatment plan with pt at bedside and pt agreed to plan.   Labs (all labs ordered are listed, but only abnormal results are displayed) Labs Reviewed  COMPREHENSIVE METABOLIC PANEL  ETHANOL    SALICYLATE LEVEL  ACETAMINOPHEN LEVEL  CBC  RAPID URINE DRUG SCREEN, HOSP PERFORMED    EKG  EKG Interpretation None       Radiology Ct Head Wo Contrast  Result Date: 06/19/2016 CLINICAL DATA:  Headache with lightheadedness nausea and vomiting EXAM: CT HEAD WITHOUT CONTRAST TECHNIQUE: Contiguous axial images were obtained from the base of the skull through the vertex without intravenous contrast. COMPARISON:  None. FINDINGS: Brain: No evidence of acute infarction, hemorrhage, hydrocephalus, extra-axial collection or mass lesion/mass effect. Asymmetric prominence of the left lateral ventricle. Vascular: No hyperdense vessel or unexpected calcification. Skull: Normal. Negative for fracture or focal lesion. Sinuses/Orbits: Mucosal thickening in the sphenoid, ethmoid and maxillary sinuses. No acute orbital abnormality. Other: None IMPRESSION: No CT evidence for acute intracranial abnormality Electronically Signed   By: Jasmine Pang M.D.   On: 06/19/2016 19:49    Procedures Procedures (including critical care time)  Medications Ordered in ED Medications - No data to display   Initial Impression / Assessment and Plan / ED Course  I have reviewed the triage vital signs and the nursing notes.  Pertinent labs & imaging results that were available during my care of the patient were reviewed by me and considered in my medical decision making (see chart for details).  Clinical Course    Evan Tanner is a 41 y.o. male who presents to ED for Suicidal ideations and hallucinations. He endorses suicidal thoughts to me during examination, stating that he was running through traffic in an attempt to take his life. Labs reviewed and reassuring. UDS + for cocaine. Disposition per TTS.   Final Clinical Impressions(s) / ED Diagnoses   Final diagnoses:  None   New Prescriptions New Prescriptions   No medications on file   I personally performed the services described in this documentation,  which was scribed in my presence. The recorded information has been reviewed and is accurate.    [facility] Dashea Mcmullan, PA-C 06/20/16 2149    Derwood Kaplan, MD 06/21/16 [phone]

## 2016-06-21 DIAGNOSIS — F17210 Nicotine dependence, cigarettes, uncomplicated: Secondary | ICD-10-CM

## 2016-06-21 DIAGNOSIS — F1414 Cocaine abuse with cocaine-induced mood disorder: Secondary | ICD-10-CM

## 2016-06-21 DIAGNOSIS — Z79899 Other long term (current) drug therapy: Secondary | ICD-10-CM

## 2016-06-21 DIAGNOSIS — Z9889 Other specified postprocedural states: Secondary | ICD-10-CM

## 2016-06-21 NOTE — Consult Note (Signed)
[facility] Consult   Reason for Consult:  Cocaine abuse with suicidal ideations and psychosis Referring Physician:  EDP Patient Identification: Evan Tanner MRN:  [id number] Principal Diagnosis: Cocaine abuse with cocaine-induced mood disorder [facility]) Diagnosis:   Patient Active Problem List   Diagnosis Date Noted  . Cocaine abuse with cocaine-induced mood disorder [facility]) [F14.14] 06/21/2016    Priority: High    Total Time spent with patient: 45 minutes  Subjective:   Evan Tanner is a 41 y.o. male patient who wants a bus ticket out of [place].    HPI:  41 yo male who presented to the ED with cocaine abuse and suicidal with hallucinations.  He has had ten visits to the ED sine 12/29 for similar issues.  Today, he is focused on getting a ticket out of [place], "I don't want to be [place] anymore."  He is homeless and has used his time at the [facility].  Today, he is cleared and coherent with no suicidal/homicidal ideations, hallucinations, and withdrawal symptoms.  Stable for discharge.  Past Psychiatric History: cocaine abuse  Risk to Self: None Risk to Others: None Prior Inpatient Therapy: Prior Inpatient Therapy: No Prior Outpatient Therapy: Prior Outpatient Therapy: No Does patient have an ACCT team?: No Does patient have Intensive In-House Services?  : No Does patient have [facility] services? : No Does patient have P4CC services?: No  Past Medical History:  Past Medical History:  Diagnosis Date  . ADHD   . Back pain     Past Surgical History:  Procedure Laterality Date  . ANKLE SURGERY    . BACK SURGERY     Family History: History reviewed. No pertinent family history. Family Psychiatric  History: none Social History:  History  Alcohol Use  . Yes     History  Drug Use No    Social History   Social History  . Marital status: Single    Spouse name: N/A  . Number of children: N/A  . Years of education: N/A   Social History Main  Topics  . Smoking status: Current Every Day Smoker    Packs/day: 0.50    Years: 15.00    Types: Cigarettes  . Smokeless tobacco: Never Used  . Alcohol use Yes  . Drug use: No  . Sexual activity: Not Asked   Other Topics Concern  . None   Social History Narrative  . None   Additional Social History:    Allergies:  No Known Allergies  Labs:  Results for orders placed or performed during the hospital encounter of 06/20/16 (from the past 48 hour(s))  Rapid urine drug screen (hospital performed)     Status: Abnormal   Collection Time: 06/20/16  8:47 PM  Result Value Ref Range   Opiates NONE DETECTED NONE DETECTED   Cocaine POSITIVE (A) NONE DETECTED   Benzodiazepines NONE DETECTED NONE DETECTED   Amphetamines NONE DETECTED NONE DETECTED   Tetrahydrocannabinol NONE DETECTED NONE DETECTED   Barbiturates NONE DETECTED NONE DETECTED    Comment:        DRUG SCREEN FOR MEDICAL PURPOSES ONLY.  IF CONFIRMATION IS NEEDED FOR ANY PURPOSE, NOTIFY LAB WITHIN 5 DAYS.        LOWEST DETECTABLE LIMITS FOR URINE DRUG SCREEN Drug Class       Cutoff (ng/mL) Amphetamine      1000 Barbiturate      200 Benzodiazepine   165 Tricyclics       537 Opiates  300 Cocaine          300 THC              50   Comprehensive metabolic panel     Status: Abnormal   Collection Time: 06/20/16  8:59 PM  Result Value Ref Range   Sodium 141 135 - 145 mmol/L   Potassium 3.5 3.5 - 5.1 mmol/L   Chloride 106 101 - 111 mmol/L   CO2 21 (L) 22 - 32 mmol/L   Glucose, Bld 96 65 - 99 mg/dL   BUN 12 6 - 20 mg/dL   Creatinine, Ser 0.98 0.61 - 1.24 mg/dL   Calcium 9.3 8.9 - 10.3 mg/dL   Total Protein 7.8 6.5 - 8.1 g/dL   Albumin 4.9 3.5 - 5.0 g/dL   AST 28 15 - 41 U/L   ALT 16 (L) 17 - 63 U/L   Alkaline Phosphatase 76 38 - 126 U/L   Total Bilirubin 0.9 0.3 - 1.2 mg/dL   GFR calc non Af Amer >60 >60 mL/min   GFR calc Af Amer >60 >60 mL/min    Comment: (NOTE) The eGFR has been calculated using the CKD  EPI equation. This calculation has not been validated in all clinical situations. eGFR's persistently <60 mL/min signify possible Chronic Kidney Disease.    Anion gap 14 5 - 15  Ethanol     Status: None   Collection Time: 06/20/16  8:59 PM  Result Value Ref Range   Alcohol, Ethyl (B) <5 <5 mg/dL    Comment:        LOWEST DETECTABLE LIMIT FOR SERUM ALCOHOL IS 5 mg/dL FOR MEDICAL PURPOSES ONLY   Salicylate level     Status: None   Collection Time: 06/20/16  8:59 PM  Result Value Ref Range   Salicylate Lvl <4.0 2.8 - 30.0 mg/dL  Acetaminophen level     Status: Abnormal   Collection Time: 06/20/16  8:59 PM  Result Value Ref Range   Acetaminophen (Tylenol), Serum <10 (L) 10 - 30 ug/mL    Comment:        THERAPEUTIC CONCENTRATIONS VARY SIGNIFICANTLY. A RANGE OF 10-30 ug/mL MAY BE AN EFFECTIVE CONCENTRATION FOR MANY PATIENTS. HOWEVER, SOME ARE BEST TREATED AT CONCENTRATIONS OUTSIDE THIS RANGE. ACETAMINOPHEN CONCENTRATIONS >150 ug/mL AT 4 HOURS AFTER INGESTION AND >50 ug/mL AT 12 HOURS AFTER INGESTION ARE OFTEN ASSOCIATED WITH TOXIC REACTIONS.   cbc     Status: Abnormal   Collection Time: 06/20/16  8:59 PM  Result Value Ref Range   WBC 9.8 4.0 - 10.5 K/uL   RBC 4.77 4.22 - 5.81 MIL/uL   Hemoglobin 15.7 13.0 - 17.0 g/dL   HCT 43.4 39.0 - 52.0 %   MCV 91.0 78.0 - 100.0 fL   MCH 32.9 26.0 - 34.0 pg   MCHC 36.2 (H) 30.0 - 36.0 g/dL   RDW 12.4 11.5 - 15.5 %   Platelets 285 150 - 400 K/uL    No current facility-administered medications for this encounter.    Current Outpatient Prescriptions  Medication Sig Dispense Refill  . hydrOXYzine (ATARAX/VISTARIL) 25 MG tablet Take 1 tablet (25 mg total) by mouth every 8 (eight) hours as needed for anxiety. (Patient not taking: Reported on 06/20/2016) 8 tablet 0  . hydrOXYzine (ATARAX/VISTARIL) 25 MG tablet Take 2 tablets (50 mg total) by mouth 3 (three) times daily. (Patient not taking: Reported on 06/20/2016) 28 tablet 0     Musculoskeletal: Strength & Muscle Tone: within normal limits  Gait & Station: normal Patient leans: N/A  Psychiatric Specialty Exam: Physical Exam  Constitutional: He is oriented to person, place, and time. He appears well-developed and well-nourished.  HENT:  Head: Normocephalic.  Neck: Normal range of motion.  Respiratory: Effort normal.  Musculoskeletal: Normal range of motion.  Neurological: He is alert and oriented to person, place, and time.  Psychiatric: His speech is normal and behavior is normal. Judgment and thought content normal. His mood appears anxious. Cognition and memory are normal.    Review of Systems  Psychiatric/Behavioral: Positive for substance abuse. The patient is nervous/anxious.   All other systems reviewed and are negative.   Blood pressure 131/67, pulse 78, temperature 97.8 F (36.6 C), temperature source Oral, resp. rate 20, SpO2 100 %.There is no height or weight on file to calculate BMI.  General Appearance: Casual  Eye Contact:  Good  Speech:  Normal Rate  Volume:  Normal  Mood:  Irritable  Affect:  Congruent  Thought Process:  Coherent and Descriptions of Associations: Intact  Orientation:  Full (Time, Place, and Person)  Thought Content:  WDL  Suicidal Thoughts:  No  Homicidal Thoughts:  No  Memory:  Immediate;   Good Recent;   Good Remote;   Good  Judgement:  Fair  Insight:  Fair  Psychomotor Activity:  Normal  Concentration:  Concentration: Good and Attention Span: Good  Recall:  Good  Fund of Knowledge:  Fair  Language:  Good  Akathisia:  No  Handed:  Right  AIMS (if indicated):     Assets:  Leisure Time Physical Health Resilience  ADL's:  Intact  Cognition:  WNL  Sleep:        Treatment Plan Summary: Daily contact with patient to assess and evaluate symptoms and progress in treatment, Medication management and Plan cocaine abuse with cocaine induced mood disorder:  -Crisis stabilization -Medication management: No  medications started as he needed to clear the cocaine -Individual and substance abuse counseling  Disposition: No evidence of imminent risk to self or others at present.    Waylan Boga, NP 06/21/2016 10:38 AM   Reviewed the information documented and agree with the treatment plan.  [facility] 06/21/2016 2:31 PM
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
## 2016-06-21 NOTE — [facility] Suicide Risk Assessment (Signed)
Suicide Risk Assessment  Discharge Assessment   [facility]   Principal Problem: Cocaine abuse with cocaine-induced mood disorder [facility](HCC) Discharge Diagnoses:  Patient Active Problem List   Diagnosis Date Noted  . Cocaine abuse with cocaine-induced mood disorder [facility](HCC) [F14.14] 06/21/2016    Priority: High    Total Time spent with patient: 45 minutes  Musculoskeletal: Strength & Muscle Tone: within normal limits Gait & Station: normal Patient leans: N/A  Psychiatric Specialty Exam: Physical Exam  Constitutional: He is oriented to person, place, and time. He appears well-developed and well-nourished.  HENT:  Head: Normocephalic.  Neck: Normal range of motion.  Respiratory: Effort normal.  Musculoskeletal: Normal range of motion.  Neurological: He is alert and oriented to person, place, and time.  Psychiatric: His speech is normal and behavior is normal. Judgment and thought content normal. His mood appears anxious. Cognition and memory are normal.    Review of Systems  Psychiatric/Behavioral: Positive for substance abuse. The patient is nervous/anxious.   All other systems reviewed and are negative.   Blood pressure 131/67, pulse 78, temperature 97.8 F (36.6 C), temperature source Oral, resp. rate 20, SpO2 100 %.There is no height or weight on file to calculate BMI.  General Appearance: Casual  Eye Contact:  Good  Speech:  Normal Rate  Volume:  Normal  Mood:  Irritable  Affect:  Congruent  Thought Process:  Coherent and Descriptions of Associations: Intact  Orientation:  Full (Time, Place, and Person)  Thought Content:  WDL  Suicidal Thoughts:  No  Homicidal Thoughts:  No  Memory:  Immediate;   Good Recent;   Good Remote;   Good  Judgement:  Fair  Insight:  Fair  Psychomotor Activity:  Normal  Concentration:  Concentration: Good and Attention Span: Good  Recall:  Good  Fund of Knowledge:  Fair  Language:  Good  Akathisia:  No  Handed:   Right  AIMS (if indicated):     Assets:  Leisure Time Physical Health Resilience  ADL's:  Intact  Cognition:  WNL  Sleep:       Mental Status Per Nursing Assessment::   On Admission:   cocaine abuse with suicidal ideations and hallucinations  Demographic Factors:  Male and Caucasian  Loss Factors: NA  Historical Factors: NA  Risk Reduction Factors:   Sense of responsibility to family  Continued Clinical Symptoms:  Irritability, mild  Cognitive Features That Contribute To Risk:  None    Suicide Risk:  Minimal: No identifiable suicidal ideation.  Patients presenting with no risk factors but with morbid ruminations; may be classified as minimal risk based on the severity of the depressive symptoms    Plan Of Care/Follow-up recommendations:  Activity:  as tolerated Diet:  heart healthy diet  Orlandus Borowski, NP 06/21/2016, 10:48 AM

## 2016-06-24 ENCOUNTER — Encounter (HOSPITAL_COMMUNITY): Payer: Self-pay | Admitting: Emergency Medicine

## 2016-06-24 ENCOUNTER — Emergency Department (HOSPITAL_COMMUNITY)
Admission: EM | Admit: 2016-06-24 | Discharge: 2016-06-24 | Disposition: A | Discharge status: Home / Self Care | Payer: Self-pay | Attending: Emergency Medicine | Admitting: Emergency Medicine

## 2016-06-24 DIAGNOSIS — R200 Anesthesia of skin: Secondary | ICD-10-CM | POA: Insufficient documentation

## 2016-06-24 DIAGNOSIS — F17210 Nicotine dependence, cigarettes, uncomplicated: Secondary | ICD-10-CM | POA: Insufficient documentation

## 2016-06-24 DIAGNOSIS — F909 Attention-deficit hyperactivity disorder, unspecified type: Secondary | ICD-10-CM | POA: Insufficient documentation

## 2016-06-24 DIAGNOSIS — M5442 Lumbago with sciatica, left side: Secondary | ICD-10-CM | POA: Insufficient documentation

## 2016-06-24 LAB — URINALYSIS, ROUTINE W REFLEX MICROSCOPIC
BACTERIA UA: NONE SEEN
Bilirubin Urine: NEGATIVE
GLUCOSE, UA: NEGATIVE mg/dL
Hgb urine dipstick: NEGATIVE
Ketones, ur: NEGATIVE mg/dL
LEUKOCYTES UA: NEGATIVE
Nitrite: NEGATIVE
PROTEIN: NEGATIVE mg/dL
Specific Gravity, Urine: 1.008 (ref 1.005–1.030)
pH: 8 (ref 5.0–8.0)

## 2016-06-24 LAB — I-STAT CHEM 8, ED
BUN: 11 mg/dL (ref 6–20)
BUN: 11 mg/dL (ref 6–20)
CHLORIDE: 103 mmol/L (ref 101–111)
Calcium, Ion: 1.26 mmol/L (ref 1.15–1.40)
Calcium, Ion: 1.26 mmol/L (ref 1.15–1.40)
Chloride: 103 mmol/L (ref 101–111)
Creatinine, Ser: 1 mg/dL (ref 0.61–1.24)
Creatinine, Ser: 1 mg/dL (ref 0.61–1.24)
GLUCOSE: 93 mg/dL (ref 65–99)
Glucose, Bld: 93 mg/dL (ref 65–99)
HCT: 42 % (ref 39.0–52.0)
HEMATOCRIT: 42 % (ref 39.0–52.0)
Hemoglobin: 14.3 g/dL (ref 13.0–17.0)
Hemoglobin: 14.3 g/dL (ref 13.0–17.0)
POTASSIUM: 4.7 mmol/L (ref 3.5–5.1)
Potassium: 4.7 mmol/L (ref 3.5–5.1)
SODIUM: 142 mmol/L (ref 135–145)
Sodium: 142 mmol/L (ref 135–145)
TCO2: 29 mmol/L (ref 0–100)
TCO2: 29 mmol/L (ref 0–100)

## 2016-06-24 MED ORDER — PREDNISONE 20 MG PO TABS
40.0000 mg | ORAL_TABLET | Freq: Every day | ORAL | 0 refills | Status: AC
Start: 2016-06-24 — End: ?

## 2016-06-24 MED ORDER — KETOROLAC TROMETHAMINE 30 MG/ML IJ SOLN
15.0000 mg | Freq: Once | INTRAMUSCULAR | Status: AC
  Administered 2016-06-24: 15 mg via INTRAMUSCULAR
  Filled 2016-06-24: qty 1

## 2016-06-24 MED ORDER — PREDNISONE 50 MG PO TABS
60.0000 mg | ORAL_TABLET | ORAL | Status: AC
  Administered 2016-06-24: 60 mg via ORAL
  Filled 2016-06-24: qty 1

## 2016-06-24 NOTE — Discharge Instructions (Signed)
As discussed, your evaluation today has been largely reassuring.  But, it is important that you monitor your condition carefully, and do not hesitate to return to the ED if you develop new, or concerning changes in your condition. ? ?Otherwise, please follow-up with your physician for appropriate ongoing care. ? ?

## 2016-06-24 NOTE — ED Triage Notes (Signed)
Pt reports lifting heavy object 2 days ago, has had rods placed in back.  Since lifting, pt has been urinating on self as well as stool.  Pt alert and oriented at this time.

## 2016-06-24 NOTE — ED Provider Notes (Signed)
[facility]-EMERGENCY DEPT Provider Note   CSN: [id number] Arrival date & time: 06/24/16  1119   By signing my name below, I, Cynda Acres, attest that this documentation has been prepared under the direction and in the presence of Gerhard Munch, MD. Electronically Signed: Cynda Acres, Scribe. 06/24/16. 12:31 PM.  History   Chief Complaint Chief Complaint  Patient presents with  . Urinary Frequency    HPI Comments: Evan Tanner is a 41 y.o. male with a hx of ADD, recurrent severe MDD, and polysubstance abuse, who presents to the Emergency Department complaining of changes in urinary and bowel movement patterns. Patient  states he was lifting a heavy object (concrete) 2 days ago in which he felt something twist. Patient has associated lower right back pain, numbness in his legs (L>R), and "pins and needles" in the feet. Patient states he had a "compound" fracture to the T12 resulting in surgery in 2014. Patient reports using motrin and tylenol with no improvement. Patient states his recent suicidal ideations are no longer present. Pateint states he is homeless. He denies any other symptoms.   He initially denies any recent ED visits, but when asked about eval 3d pta, he acknowledges eval at our affiliated site.  The history is provided by the patient. No language interpreter was used.    Past Medical History:  Diagnosis Date  . ADHD   . Back pain     Patient Active Problem List   Diagnosis Date Noted  . Cocaine abuse with cocaine-induced mood disorder (HCC) 06/21/2016    Past Surgical History:  Procedure Laterality Date  . ANKLE SURGERY    . BACK SURGERY         Home Medications    Prior to Admission medications   Medication Sig Start Date End Date Taking? Authorizing Provider  hydrOXYzine (ATARAX/VISTARIL) 25 MG tablet Take 1 tablet (25 mg total) by mouth every 8 (eight) hours as needed for anxiety. Patient not taking: Reported on 06/20/2016 06/12/16   Laurence Spates, MD  hydrOXYzine (ATARAX/VISTARIL) 25 MG tablet Take 2 tablets (50 mg total) by mouth 3 (three) times daily. Patient not taking: Reported on 06/20/2016 06/20/16   Arby Barrette, MD    Family History History reviewed. No pertinent family history.  Social History Social History  Substance Use Topics  . Smoking status: Current Every Day Smoker    Packs/day: 0.50    Years: 15.00    Types: Cigarettes  . Smokeless tobacco: Never Used  . Alcohol use Yes     Allergies   Patient has no known allergies.   Review of Systems Review of Systems  Constitutional:       Per HPI, otherwise negative  HENT:       Per HPI, otherwise negative  Respiratory:       Per HPI, otherwise negative  Cardiovascular:       Per HPI, otherwise negative  Gastrointestinal: Negative for abdominal pain, nausea and vomiting.  Endocrine:       Negative aside from HPI  Genitourinary:       Neg aside from HPI   Musculoskeletal:       Per HPI, otherwise negative  Skin: Negative.   Allergic/Immunologic: Negative for immunocompromised state.  Neurological: Positive for weakness. Negative for syncope.  Psychiatric/Behavioral: Negative for suicidal ideas. The patient is nervous/anxious.      Physical Exam Updated Vital Signs BP 124/69 (BP Location: Left Arm)   Pulse 89   Temp 98.1 F (36.7 C) (  Oral)   Resp 16   Ht 5\' 8"  (1.727 m)   Wt 145 lb (65.8 kg)   SpO2 99%   BMI 22.05 kg/m   Physical Exam  Constitutional: He is oriented to person, place, and time. He appears well-developed. No distress.  HENT:  Head: Normocephalic and atraumatic.  Eyes: Conjunctivae and EOM are normal.  Cardiovascular: Normal rate and regular rhythm.   Pulmonary/Chest: Effort normal. No stridor. No respiratory distress.  Abdominal: He exhibits no distension.  No lower abd pain  Musculoskeletal: He exhibits no edema.  No deformity, patient has 5/5 strength in both LE, though he has pain in the lower back w L hip  flexion.  Neurological: He is alert and oriented to person, place, and time.  Skin: Skin is warm and dry.  Psychiatric: He has a normal mood and affect.  Nursing note and vitals reviewed.    ED Treatments / Results  DIAGNOSTIC STUDIES: Oxygen Saturation is 99% on RA, normal by my interpretation.    COORDINATION OF CARE: 12:27 PM Discussed treatment plan with pt at bedside and pt agreed to plan.  Labs (all labs ordered are listed, but only abnormal results are displayed) Labs Reviewed - No data to display  Procedures Procedures (including critical care time)  Medications Ordered in ED Medications  ketorolac (TORADOL) 30 MG/ML injection 15 mg (not administered)  predniSONE (DELTASONE) tablet 60 mg (not administered)     Initial Impression / Assessment and Plan / ED Course  I have reviewed the triage vital signs and the nursing notes.  Pertinent labs & imaging results that were available during my care of the patient were reviewed by me and considered in my medical decision making (see chart for details).  Clinical Course     This young male with history of substance abuse, multiple prior ED visits presents with ongoing low back pain, with radicular features. The patient does describe some difficult with urination, but there is no evidence for cauda equina syndrome, patient is ambulatory, with appropriate strength in his lower extremities, has no abdominal pain suggesting retention. Patient received initiation of therapy for radiculopathy, was provided resources for outpatient follow-up, discharged in stable condition.  Final Clinical Impressions(s) / ED Diagnoses   I personally performed the services described in this documentation, which was scribed in my presence. The recorded information has been reviewed and is accurate.       Gerhard Munchobert Aide Wojnar, MD 06/24/16 1447

## 2017-01-05 ENCOUNTER — Emergency Department
Admission: EM | Admit: 2017-01-05 | Discharge: 2017-01-05 | Disposition: A | Discharge status: Home / Self Care | Payer: MEDICAID | Attending: Medical Toxicology | Admitting: Medical Toxicology

## 2017-01-05 DIAGNOSIS — Z590 Homelessness: Secondary | ICD-10-CM | POA: Insufficient documentation

## 2017-01-05 DIAGNOSIS — R519 Headache, unspecified: Secondary | ICD-10-CM

## 2017-01-05 DIAGNOSIS — F250 Schizoaffective disorder, bipolar type: Secondary | ICD-10-CM | POA: Insufficient documentation

## 2017-01-05 DIAGNOSIS — F1510 Other stimulant abuse, uncomplicated: Principal | ICD-10-CM | POA: Insufficient documentation

## 2017-01-05 DIAGNOSIS — R51 Headache: Secondary | ICD-10-CM | POA: Insufficient documentation

## 2017-01-05 LAB — BASIC METABOLIC PANEL
CARBON DIOXIDE TOTAL: 26 mmol/L (ref 24–32)
CHLORIDE: 103 mmol/L (ref 95–110)
CREATININE BLOOD: 0.8 mg/dL (ref 0.44–1.27)
Calcium: 8.8 mg/dL (ref 8.6–10.5)
GLUCOSE: 100 mg/dL — AB (ref 70–99)
POTASSIUM: 3.3 mmol/L (ref 3.3–5.0)
SODIUM: 138 mmol/L (ref 135–145)
UREA NITROGEN, BLOOD (BUN): 13 mg/dL (ref 8–22)

## 2017-01-05 LAB — CBC WITH DIFFERENTIAL
BASOPHILS % AUTO: 2.3 %
BASOPHILS ABS AUTO: 0.1 10*3/uL (ref 0.0–0.2)
EOSINOPHIL % AUTO: 2.7 %
Eosinophils Abs Auto: 0.2 10*3/uL (ref 0.0–0.5)
HEMATOCRIT: 40.5 % (ref 40.0–52.0)
HEMOGLOBIN: 14.4 g/dL (ref 14.0–18.0)
LYMPHOCYTES % AUTO: 29.8 %
Lymphocytes Abs Auto: 1.7 10*3/uL (ref 1.0–4.8)
MCH: 33.3 pg — AB (ref 27.0–33.0)
MCHC: 35.6 % (ref 32.0–36.0)
MCV: 93.6 UM3 (ref 80.0–100.0)
MONOCYTES % AUTO: 9 %
MPV: 7.8 UM3 (ref 6.8–10.0)
Monocytes Abs Auto: 0.5 10*3/uL (ref 0.1–0.8)
NEUTROPHIL ABS AUTO: 3.2 10*3/uL (ref 1.8–7.7)
NEUTROPHILS % AUTO: 56.2 %
PLATELET COUNT: 261 10*3/uL (ref 130–400)
RDW: 13.2 % (ref 0.0–14.7)
RED CELL COUNT: 4.33 10*6/uL — AB (ref 4.50–5.90)
WHITE BLOOD CELL COUNT: 5.7 10*3/uL (ref 4.5–11.0)

## 2017-01-05 LAB — UR DRUGS OF ABUSE SCREEN
AMPHETAMINE SCREEN, URINE: POSITIVE ng/mL — AB
BARBITURATES SCREEN, URINE: NEGATIVE
BENZODIAZEPINES SCREEN, URINE: NEGATIVE
COCAINE METABOLITE SCRN, URINE: NEGATIVE
OPIATES SCREEN, URINE: NEGATIVE

## 2017-01-05 LAB — LACTIC ACID, WHOLE BLD VENOUS

## 2017-01-05 MED ORDER — NACL 0.9% IV BOLUS - DURATION REQ
1000.0000 mL | Freq: Once | INTRAVENOUS | Status: DC
Start: 2017-01-05 — End: 2017-01-05

## 2017-01-05 MED ORDER — ONDANSETRON 4 MG DISINTEGRATING TABLET
4.0000 mg | DISINTEGRATING_TABLET | Freq: Once | ORAL | Status: AC
Start: 2017-01-05 — End: 2017-01-05
  Administered 2017-01-05: 4 mg via ORAL
  Filled 2017-01-05: qty 1

## 2017-01-05 MED ORDER — ACETAMINOPHEN 325 MG TABLET
650.0000 mg | ORAL_TABLET | Freq: Once | ORAL | Status: AC
Start: 2017-01-05 — End: 2017-01-05
  Administered 2017-01-05: 650 mg via ORAL
  Filled 2017-01-05: qty 2

## 2017-01-05 MED ORDER — BUTALBITAL-ACETAMINOPHEN-CAFFEINE 50 MG-325 MG-40 MG TABLET
1.0000 | ORAL_TABLET | Freq: Once | ORAL | Status: AC
Start: 2017-01-05 — End: 2017-01-05
  Administered 2017-01-05: 1 via ORAL
  Filled 2017-01-05: qty 1

## 2017-01-05 NOTE — ED Initial Note (Signed)
EMERGENCY DEPARTMENT PHYSICIAN NOTE - Carl Mata       Date of Service:   01/05/2017  3:31 AM Patient's PCP: No Pcp Per Patient   Note Started: 01/05/2017 04:27 DOB: 11-23-1975         Chief Complaint   Patient presents with    Exposure Test Request     sts exposed to radiation, toxic mold and asbestos  plus not sure what else he may have been exposed to a couple weeks ago       The history provided by the patient.  Interpreter used: No    Carl Mata, who has a past medical history significant for methamphetamine use, presenting to the ED with a chief complaint of headache that began 1 week ago. His headache covers his whole head and is 7/10 in intensity now but at times can be as high as 10/10.  Pain has been gradually worsening over the week.  Pain does not worsen with lying flat and he has not had any associated vomiting.  When his headache becomes most severe he becomes tremulous.  In addition he reports significant confusion, episodes of nausea and shooting pains through his feet into his back.    He attributes all of these symptoms to 8-9 hours he spent [place] of a [place] the train [place] when he got [place] inside.  He believes that he was poisoned by radiation, asbestos, and mold.       A full history, including past medical, social, and family history (as detailed in this note), was reviewed and updated as necessary.      HISTORY:  Past Medical History   Diagnosis    Bipolar 1 disorder    Schizoaffective disorder    Unspecified asthma(493.90)    Unspecified mental or behavioral problem    Allergies   Allergen Reactions    Eggs [Egg] Hives    Nsaids (Non-Steroidal Anti-Inflammatory Drug) Anaphylaxis      Past Surgical History:  04/04/2013: Fusion, spine, thoracic, using posterior techn* N/A  04/04/2013: Laminectomy, thoracic, posterior approach N/A   Current Outpatient Prescriptions:     TRAMADOL HCL (TRAMADOL PO),      Social History    Marital status: SINGLE               Spouse name:                       Years of education:                 Number of children:               Occupational History    None on file    Social History Main Topics    Smoking status: Current Every Day Smoker                                                     Packs/day: 0.50      Years: 15.00          Types: Cigarettes       Smokeless status: Not on file                       Alcohol use: Yes  Comment: 4-5 beers a day    Drug use: Yes                Special: Methamphetamine       Comment: active meth use    Sexual activity: Not on file          Other Topics            Concern    None on file    Social History Narrative    As of 04/07/2013:     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 Reportedly on parole in [place]. Used to work as a Designer, fashion/clothing.    Lives in [place], currently "on the street," has a girlfriend but does not live with her. Doesn't know where he will live next. Says his GF's worker is looking into this.    H/o alcohol dependence (reports being sober for 1 year), + tobacco, + drug use (meth; reports last use >1 year ago).    ---------------------------     Family History   Problem Relation Age of Onset    No FH spine problems [OTHER] Other            Review of Systems   Constitutional: Positive for activity change, appetite change and fever.   Eyes: Negative for photophobia and visual disturbance.   Respiratory: Positive for cough. Negative for shortness of breath.    Cardiovascular: Negative for chest pain and leg swelling.   Gastrointestinal: Positive for nausea. Negative for abdominal distention, diarrhea and vomiting.   Genitourinary: Negative for dysuria, frequency and urgency.   Musculoskeletal: Negative for neck pain and neck stiffness.   Skin: Negative for rash.   Neurological: Positive for tremors, syncope, light-headedness and numbness.   Psychiatric/Behavioral: Positive for confusion and sleep disturbance.   All other systems  reviewed and are negative.         TRIAGE VITAL SIGNS:  Temp: 36.6 C (97.9 F) (01/05/17 0319)  Temp src: Oral (01/05/17 0319)  Pulse: 103 (01/05/17 0319)  BP: (!) 131/93 (01/05/17 0319)  Resp: 16 (01/05/17 0319)  SpO2: 99 % (01/05/17 0319)  Weight: 60.8 kg (134 lb 0.6 oz) (01/05/17 0319)    Physical Exam   Constitutional: He is oriented to person, place, and time. He appears distressed.   Underweight   HENT:   Head: Normocephalic and atraumatic.   Eyes: Conjunctivae and EOM are normal. Pupils are equal, round, and reactive to light.   Neck: Normal range of motion. Neck supple.   Cardiovascular: Normal rate, regular rhythm and normal heart sounds.  Exam reveals no friction rub.    No murmur heard.  Pulmonary/Chest: No respiratory distress. He exhibits no tenderness.   Abdominal: Soft. Bowel sounds are normal. He exhibits no distension.   Musculoskeletal: Normal range of motion.   Neurological: He is alert and oriented to person, place, and time. No cranial nerve deficit.   Skin: Skin is warm and dry. He is not diaphoretic.   Psychiatric:   Agitated behavior  Denies HI/SI  No AVH   Nursing note and vitals reviewed.         INITIAL ASSESSMENT & PLAN, MEDICAL DECISION MAKING, ED COURSE  Carl Mata is a 24yr Mata who presents with a chief complaint of headache/LOC.     Differential includes, but is not limited to: methamphetamine intoxication, Infection, metabolic toxicities, cardiac dysrhythmia      The results of the ED evaluation were  notable for the following: headache that is not worse with lying down or early in the morning.    Pertinent lab results:   Lactic acid negative  No leukocytosis  BMP without acute abnormalities  Patient refusing urine drug tox screen    EKG patient refusing currently    Chart Review: I reviewed the patient's prior medical records. Pertinent information that is relevant to this encounter: multiple encounters for methamphetamine abuse, schizoaffective disorder, bipolar I  disorder.      PATIENT SUMMARY  Carl Mata is a [age]yr old Mata with a medical history significant for methamphetamine abuse, schizoaffective disorder, bipolar I who presents with headache. Exam revealing no acute abnormalities. Work-up revealing no acute abnormalities.  He was offered IV fluids but declined. Diagnosis most concerning for methamphetamine abuse given history of abuse and work-up without acute abnormalities.  Vital signs normalized.  Patient was then given Fioricet for his headache.  At the time of signout, he was pending a p.o. challenge and the onset of Fioricet.  I anticipate that he will be discharged home.  At the time of my evaluation he was denying SI/HI and did not appear to be responding to internal stimuli.  He is homeless but was able to provide a plan for self-care.        LAST VITAL SIGNS:  Temp: 36.6 C (97.9 F) (01/05/17 0319)  Temp src: Oral (01/05/17 0319)  Pulse: 60 (01/05/17 0600)  BP: 120/70 (01/05/17 0600)  Resp: 14 (01/05/17 0600)  SpO2: 98 % (01/05/17 0600)  Weight: 60.8 kg (134 lb 0.6 oz) (01/05/17 0319)      Clinical Impression: methamphetamine abuse      Disposition: Pending anticipate discharge      PATIENT'S GENERAL CONDITION:  Fair: Vital signs are stable and within normal limits. Patient is conscious but may be uncomfortable. Indicators are favorable.      Electronically signed by: Devoria GlassingJames B Tur, MD, Resident         This patient was seen, evaluated, and care plan was developed with the resident.  I agree with the findings and plan as outlined in our combined note. I personally independently visualized the images and tracings as noted above.      Waynette ButteryJames Alan Benett Swoyer, MD      Electronically signed by: Waynette ButteryJames Alan Hannibal Skalla, MD, Attending Physician

## 2017-01-05 NOTE — ED Progress Note (Shared)
ED Progress Note    I received sign out on this patient from Herma CarsonJ. Tur, MD at change of shift. Please refer to prior documentation for details of the history, physical examination, and intial management.    ED Course:   Briefly, patient is a 89yr male here for evaluation of headache.    There was no pending workup or consultation at the time of signout.    ED was awaiting patient to demonstrate ability to tolerate PO and ambulate without difficulty.    Patient remained stable throughout ED course.   Patient was able to tolerate PO intake and ambulate.  Patient also reported improvement in headache and desire to leave hospital.    Temp: 36.6 C (97.9 F) (07/30 1047)  Temp src: Oral (07/30 1000)  Pulse: 78 (07/30 1047)  BP: 116/82 (07/30 1047)  Resp: 16 (07/30 1047)  SpO2: 100 % (07/30 1047)  Height: --  Weight: 60.8 kg (134 lb 0.6 oz) (07/30 0319)    Dispo:  Discharge home with PCP follow-up as needed.     Diagnosis:  1. Methamphetamine abuse    2. Nonintractable headache, unspecified chronicity pattern, unspecified headache type        Report Electronically Signed by:     Bobbye CharlestonBrian Sharol Croghan, MD  Visiting FM Resident, PGY-1  [facility]  [employer], [place]

## 2017-01-05 NOTE — ED Triage Note (Signed)
Steady gait. rr even and nl. Skin w/d. Alert and calm.poorly groomed. C/o possible exposure to many substance : molds , asbestos as claimed as pt. Reports homelessness . Reports no sleep x 2 months. Denies SI/HI. Denies ETOH. Denies illicit drug use.

## 2017-01-05 NOTE — ED Nursing Note (Signed)
Patient left ED ambulatory with DC instructions and all belongings

## 2017-01-05 NOTE — Discharge Instructions (Signed)
You were seen today in the Emergency Department for headache that you believe was caused by toxic exposure.  You were evaluated for emergent medical causes for your symptoms but none were discovered.  Please return to the Emergency Department if you experience worsening symptoms.

## 2017-01-05 NOTE — ED Nursing Note (Signed)
Patient is awake,   Denies SI/HI is calm and cooperative, Urine sample obtained and sent,  meal tray provided.  Awaiting dispo

## 2017-01-05 NOTE — ED Nursing Note (Signed)
Agree with triage RN.  Pt awake, alert, in NAD.  Pt c/o lack of sleep, not eating.  Pt denies CP, denies n/v/d

## 2017-01-05 NOTE — ED Nursing Note (Signed)
Assumed care from Noct shift RN, patient resting quietly, no new c/o pain or discomfort, acknowledges morning greeting then goes back to sleep.

## 2017-01-05 NOTE — ED Nursing Note (Signed)
Pt given [place] sandwich per MD, pt acknowledged sandwich then back to sleep

## 2017-01-15 ENCOUNTER — Emergency Department
Admission: EM | Admit: 2017-01-15 | Discharge: 2017-01-16 | Discharge status: Against Medical Advice | Payer: MEDICAID | Attending: Emergency Medicine | Admitting: Emergency Medicine

## 2017-01-15 DIAGNOSIS — Z029 Encounter for administrative examinations, unspecified: Principal | ICD-10-CM | POA: Insufficient documentation

## 2017-01-15 NOTE — ED Triage Note (Signed)
BIBA, report from medics. Pt had GLF, mechanical, onto train track 2 days ago, states felt "pop." Has not been seen by MD since incident. Hx chronic low back pain, pain increased at same location s/p fall. Pt ambulatory on scene. Denies any changes in bowel or bladder function s/p fall.  States intermittent tingling to BLE. Admits to recent marijuana use, denies any other drug or ETOH use. VS stable in triage, appears calm, cooperative.

## 2017-01-16 NOTE — ED Nursing Note (Signed)
No Answer" note: The patient was called using the ED PA system. The patient was not found after a search of the ED Waiting Room Areas.

## 2017-01-16 NOTE — ED Nursing Note (Signed)
Pt ambulatory for revitals with steady gait. VSS. In NAD

## 2017-01-16 NOTE — ED Nursing Note (Signed)
Ambulatory to triage w/ steady gait, AxO x4.  States having some difficultly having BM and urinating. Otherwise appears comfortable.

## 2017-01-22 ENCOUNTER — Emergency Department
Admission: EM | Admit: 2017-01-22 | Discharge: 2017-01-22 | Discharge status: Against Medical Advice | Payer: MEDICAID | Attending: Emergency Medicine | Admitting: Emergency Medicine

## 2017-01-22 DIAGNOSIS — R52 Pain, unspecified: Principal | ICD-10-CM | POA: Insufficient documentation

## 2017-01-22 NOTE — ED Nursing Note (Signed)
No Answer" note: The patient was called using the ED PA system. The patient was not found after a search of the ED Waiting Room Areas.

## 2017-01-22 NOTE — ED Triage Note (Signed)
Pt ambulated into triage with steady gait.  Pt is A&O x4 GCS 15, complained of pain all over body, onset one week prior to arrival to ED.  Pt endorsed a fall at onset of pain.  Pt also concerned about 1cm x 1cm patch of redness to RUE wrist which is "very painful."  Pt denies further complaints at this time.  Pt denies n/v/d.  Pt able to tolerate PO without difficulty.

## 2017-01-27 ENCOUNTER — Emergency Department
Admission: EM | Admit: 2017-01-27 | Discharge: 2017-01-27 | Discharge status: Against Medical Advice | Payer: MEDICAID | Attending: Medical Toxicology | Admitting: Medical Toxicology

## 2017-01-27 DIAGNOSIS — L259 Unspecified contact dermatitis, unspecified cause: Principal | ICD-10-CM | POA: Insufficient documentation

## 2017-01-27 DIAGNOSIS — F1510 Other stimulant abuse, uncomplicated: Secondary | ICD-10-CM | POA: Insufficient documentation

## 2017-01-27 DIAGNOSIS — F259 Schizoaffective disorder, unspecified: Secondary | ICD-10-CM | POA: Insufficient documentation

## 2017-01-27 MED ORDER — NACL 0.9% IV BOLUS - DURATION REQ
1.0000 L | Freq: Once | INTRAVENOUS | Status: AC
Start: 2017-01-27 — End: 2017-01-27
  Administered 2017-01-27: 1000 mL via INTRAVENOUS

## 2017-01-27 MED ORDER — HYDROCORTISONE 2.5 % TOPICAL CREAM
TOPICAL_CREAM | Freq: Once | TOPICAL | Status: DC
Start: 2017-01-27 — End: 2017-01-27
  Filled 2017-01-27: qty 30

## 2017-01-27 MED ORDER — BUTALBITAL-ACETAMINOPHEN-CAFFEINE 50 MG-325 MG-40 MG TABLET
1.0000 | ORAL_TABLET | Freq: Once | ORAL | Status: AC
Start: 2017-01-27 — End: 2017-01-27
  Administered 2017-01-27: 2 via ORAL
  Filled 2017-01-27: qty 2

## 2017-01-27 MED ORDER — DIPHENHYDRAMINE 25 MG CAPSULE
50.0000 mg | ORAL_CAPSULE | Freq: Once | ORAL | Status: AC
Start: 2017-01-27 — End: 2017-01-27
  Administered 2017-01-27: 50 mg via ORAL
  Filled 2017-01-27: qty 2

## 2017-01-27 NOTE — ED Nursing Note (Signed)
Pt given breakfast tray

## 2017-01-27 NOTE — ED Nursing Note (Signed)
Pt ambulatory from waiting room, complaining of a rash and pruritis to the back of his neck, skin colored pumps noted, pt concerned about "heat rash and skin cancer, A&Ox4, to monitors x2, resident MD at bedside.

## 2017-01-27 NOTE — ED Initial Note (Signed)
EMERGENCY DEPARTMENT PHYSICIAN NOTE - DETRAVION TESTER       Date of Service:   01/27/2017  6:48 AM Patient's PCP: No Pcp Per Patient   Note Started: 01/27/2017 07:28 DOB: November 25, 1975         Chief Complaint   Patient presents with    Rash Stable       The history provided by the patient.  Interpreter used: No    Carl Mata is a 41yr old male, who has a history of bipolar disorder, meth use, schizoaffectve disorder, presenting to the ED with a chief complaint of rash.    Noticed rash on L hand and neck that started a few days ago. Isn't sure if it's just because he's been outside in the sun. It's itchy but not painful. It hasn't spread at all and he hasn't tried putting anything topical on it. Also endorses a bad migraine and reports headaches and with some nausea, but no vomiting. He has been taking tylenol and ibuprofen but it doesn't help. Says he has been eating a lot and drinking a lot of water but then he just gets hungry and thirsty again 5 minutes later.     [place] [address]. Last used meth 4 days ago. Only smokes it, no injections.      A full history, including past medical, social, and family history (as detailed in this note), was reviewed and updated as necessary.      HISTORY:  Past Medical History   Diagnosis    Bipolar 1 disorder    Schizoaffective disorder    Unspecified asthma(493.90)    Unspecified mental or behavioral problem    Allergies   Allergen Reactions    Eggs [Egg] Hives    Nsaids (Non-Steroidal Anti-Inflammatory Drug) Anaphylaxis      Past Surgical History:  04/04/2013: Fusion, spine, thoracic, using posterior techn* N/A  04/04/2013: Laminectomy, thoracic, posterior approach N/A   Current Outpatient Prescriptions:     TRAMADOL HCL (TRAMADOL PO),    Social History    Marital status: SINGLE              Spouse name:                       Years of education:                 Number of children:               Occupational History    None on file    Social History Main  Topics    Smoking status: Current Every Day Smoker                                                     Packs/day: 0.50      Years: 15.00          Types: Cigarettes       Smokeless status: Not on file                       Alcohol use: Yes                Comment: 4-5 beers a day    Drug use: Yes  Special: Methamphetamine       Comment: active meth use    Sexual activity: Not on file          Other Topics            Concern    None on file    Social History Narrative    As of 04/07/2013:     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 Reportedly on parole in [place]. Used to work as a Designer, fashion/clothing.    Lives in [place], currently "on the street," has a girlfriend but does not live with her. Doesn't know where he will live next. Says his GF's worker is looking into this.    H/o alcohol dependence (reports being sober for 1 year), + tobacco, + drug use (meth; reports last use >1 year ago).    ---------------------------     Family History   Problem Relation Age of Onset    No FH spine problems [OTHER] Other            Review of Systems   Constitutional: Negative for chills and fever.   HENT: Negative for rhinorrhea and sinus pain.    Respiratory: Negative for cough, choking, chest tightness and shortness of breath.    Cardiovascular: Negative for chest pain, palpitations and leg swelling.   Genitourinary: Negative for dysuria, hematuria and urgency.   Musculoskeletal: Negative for back pain and neck pain.   Skin: Positive for rash.   Neurological: Positive for headaches. Negative for dizziness, light-headedness and numbness.   Psychiatric/Behavioral: Negative for confusion.   All other systems reviewed and are negative.         TRIAGE VITAL SIGNS:  Temp: 36 C (96.8 F) (01/27/17 8938)  Temp src: Axillary (01/27/17 1017)  Pulse: 72 (01/27/17 0652)  BP: 126/83 (01/27/17 5102)  Resp: 18 (01/27/17 0652)  SpO2: 100 % (01/27/17 0652)  Weight: 61.6 kg (135 lb 12.9 oz)  (01/27/17 5852)    Physical Exam   Constitutional: He is oriented to person, place, and time. He appears well-developed and well-nourished. No distress.   HENT:   Head: Normocephalic and atraumatic.   Mouth/Throat: Oropharynx is clear and moist.   Eyes: Conjunctivae and EOM are normal. Pupils are equal, round, and reactive to light.   Neck: Normal range of motion. Neck supple. No JVD present.   Cardiovascular: Normal rate, regular rhythm and intact distal pulses.  Exam reveals no gallop and no friction rub.    No murmur heard.  Pulmonary/Chest: Effort normal and breath sounds normal. No respiratory distress. He has no wheezes. He has no rales.   Abdominal: Soft. Bowel sounds are normal. He exhibits no distension. There is no tenderness. There is no rebound.   Musculoskeletal: Normal range of motion. He exhibits no edema, tenderness or deformity.   Neurological: He is alert and oriented to person, place, and time. No cranial nerve deficit.   Skin: Skin is warm and dry. Rash noted. He is not diaphoretic. No erythema.   Papular lesions on back of neck and dorsum of L hand   Psychiatric: He has a normal mood and affect. His behavior is normal.   Nursing note and vitals reviewed.         INITIAL ASSESSMENT & PLAN, MEDICAL DECISION MAKING, ED COURSE  Carl Mata is a 16yr male who presents with a chief complaint of rash.     Differential includes, but is not limited to: heat  rash, contact dermatitis, cellulitis,        Chart Review: I reviewed the patient's prior medical records. Pertinent information that is relevant to this encounter: meth use.      PATIENT SUMMARY  41y man with bipolar disorder, schizophrenia, meth use who presents with new rash. VSS and overall appears well on exam. Most likely some type of heat rash or contact dermatitis. Will try topical steroids for treatment and avoidance of sun exposure if possible. Received IVF, Fioricet and benadryl in ED. Patient eloped before his topical medication  arrived.       LAST VITAL SIGNS:  Temp: 36 C (96.8 F) (01/27/17 1610)  Temp src: Axillary (01/27/17 9604)  Pulse: 72 (01/27/17 0652)  BP: 126/83 (01/27/17 5409)  Resp: 18 (01/27/17 0652)  SpO2: 100 % (01/27/17 0652)  Weight: 61.6 kg (135 lb 12.9 oz) (01/27/17 8119)      Clinical Impression:   Contact dermatitis      Disposition: Eloped    PATIENT'S GENERAL CONDITION:  Good: Vital signs are stable and within normal limits. Patient is conscious and comfortable. Indicators are excellent.       Electronically signed by: Abran Duke, MD, Resident               This patient was seen, evaluated, and care plan was developed with the resident.  I agree with the findings and plan as outlined in our combined note. I personally independently visualized the images and tracings as noted above.      Waynette Buttery, MD      Electronically signed by: Waynette Buttery, MD, Attending Physician

## 2017-01-27 NOTE — ED Triage Note (Signed)
Hx of methamphetamine abuse, schizoaffective disorder, bipolar here for eval generalized pruritic rash for a week, been outdoors a lot lately. Endorses nausea but no vomiting.

## 2017-01-27 NOTE — ED Nursing Note (Signed)
Pt wanting to leave, does not want to wait for cream, MD made aware, pt ambulatory out of ED area.

## 2017-01-27 NOTE — ED Nursing Note (Signed)
Pt resting on gurney, appears asleep, pt has been medicated for HA, breakfast tray on the way, IVF bolus infusing.

## 2017-09-17 IMAGING — CR DG RIBS W/ CHEST 3+V*R*
4 series · 4 of 4 positions shown · non-contrast
Comparison: MRI 05/28/2016.

CLINICAL DATA: Fall.

EXAM:
RIGHT RIBS AND CHEST - 3+ VIEW

[w chest pa]
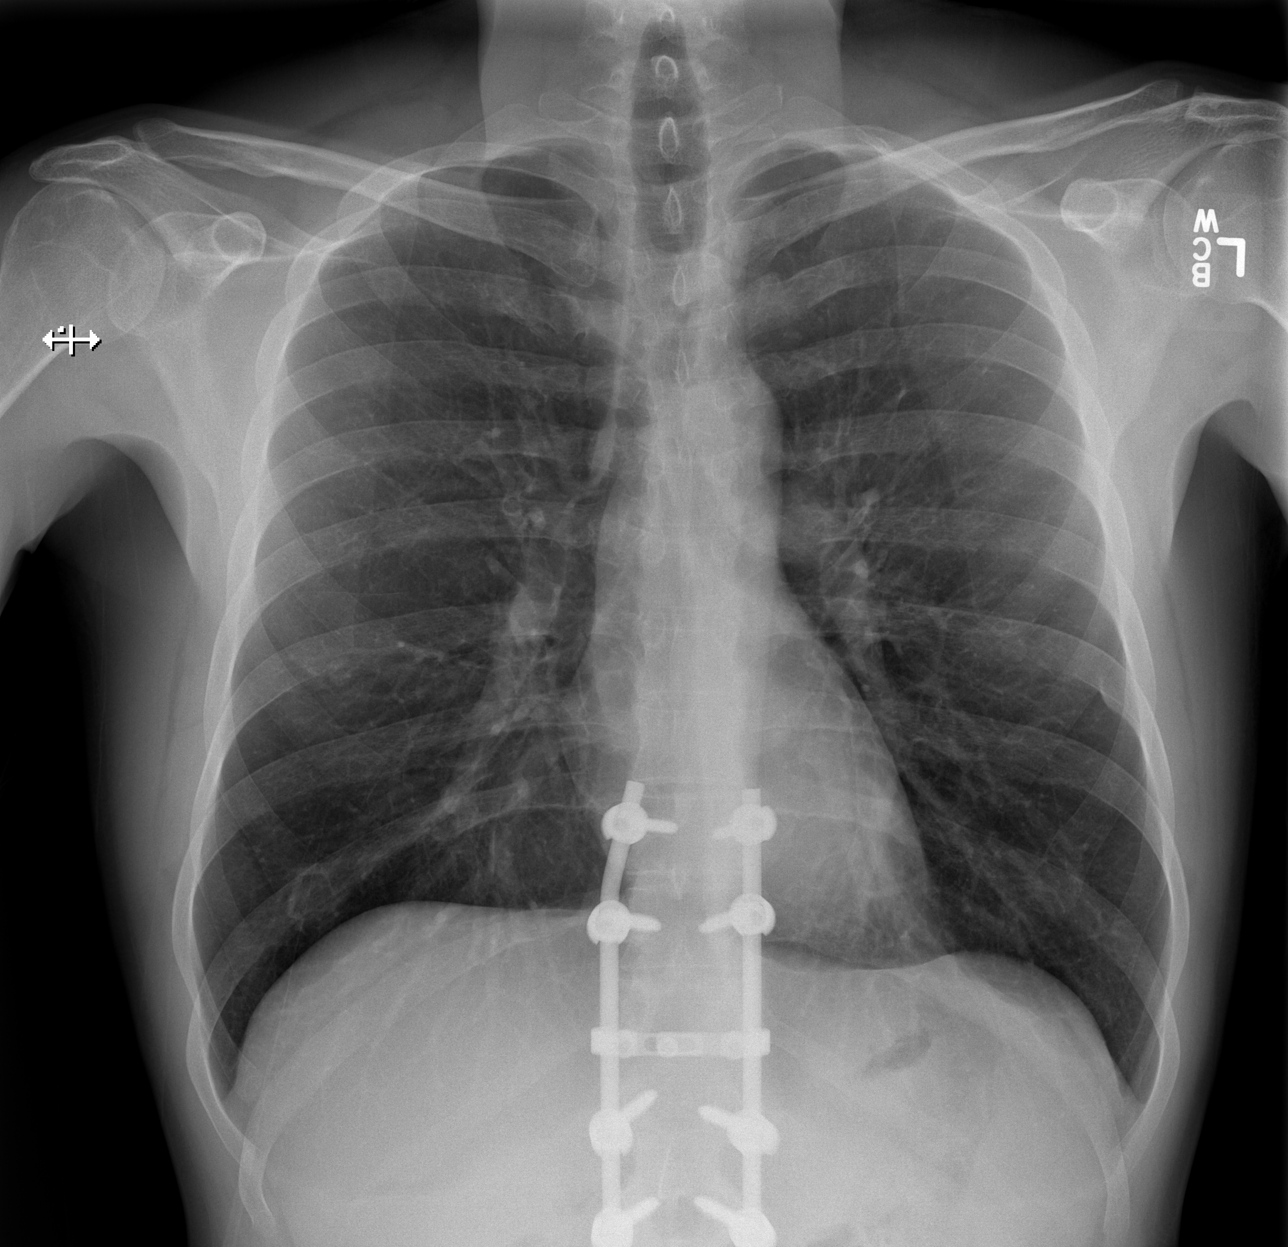

[w ribs ap lower right (1 of 2)]
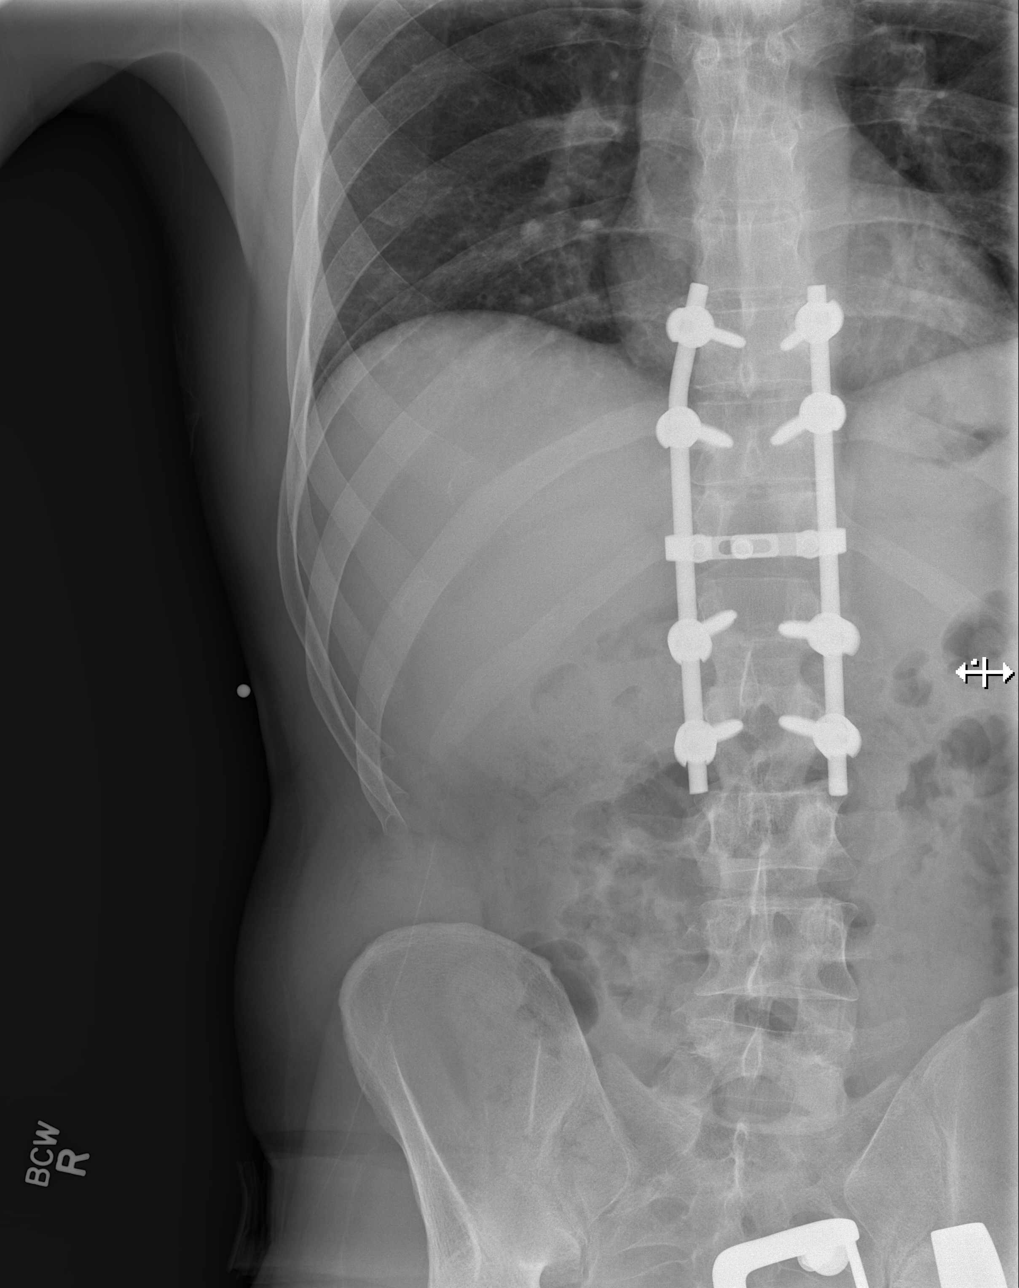

[w ribs ap lower right (2 of 2)]
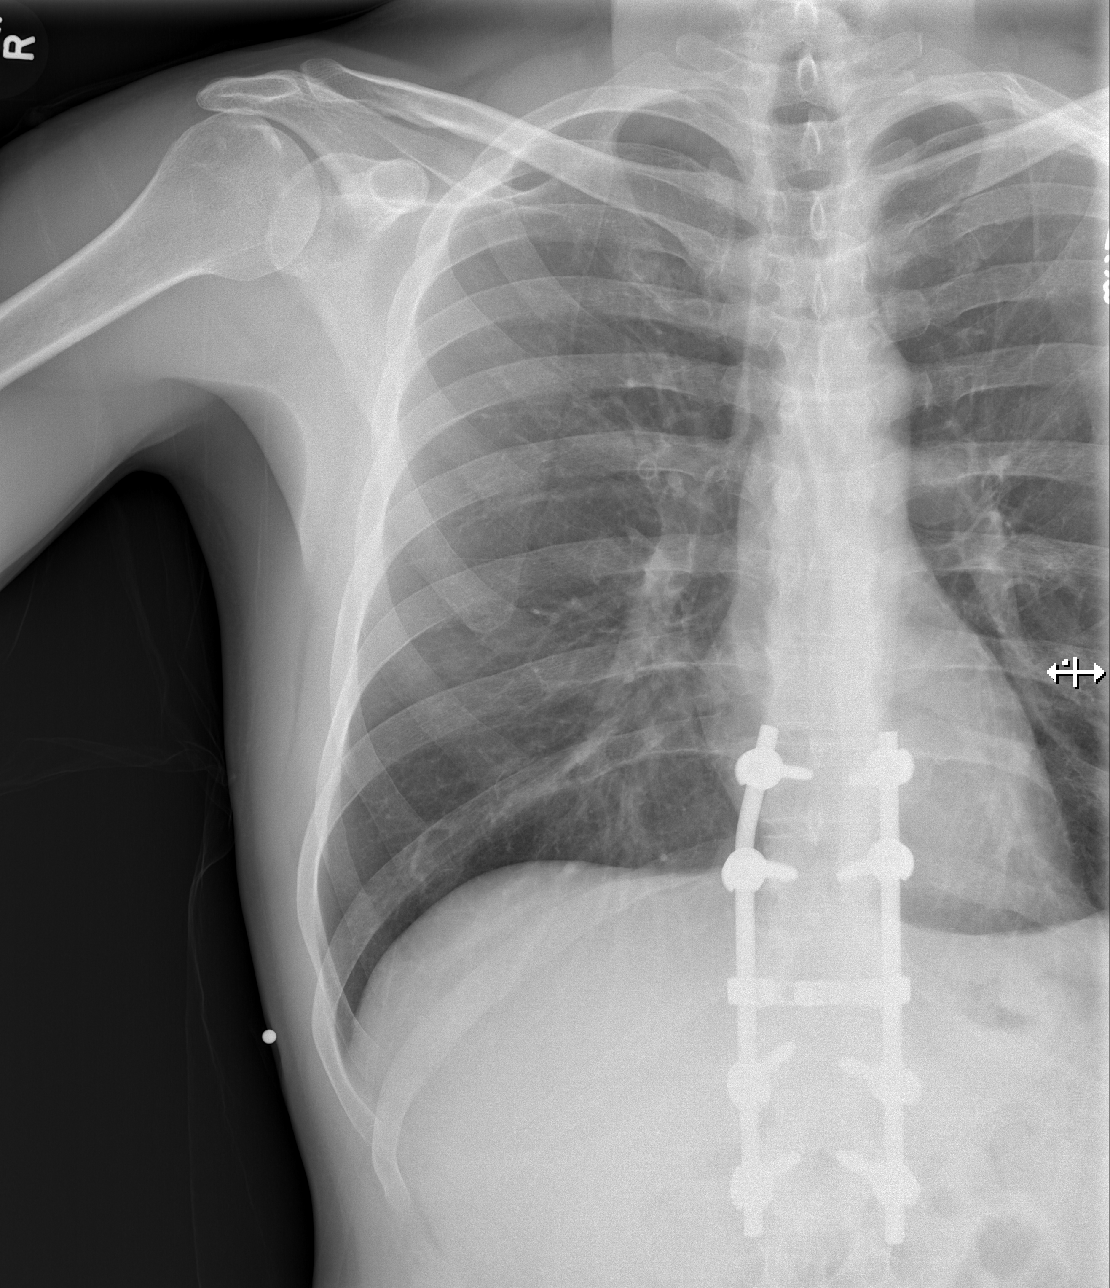

[w ribs obl right]
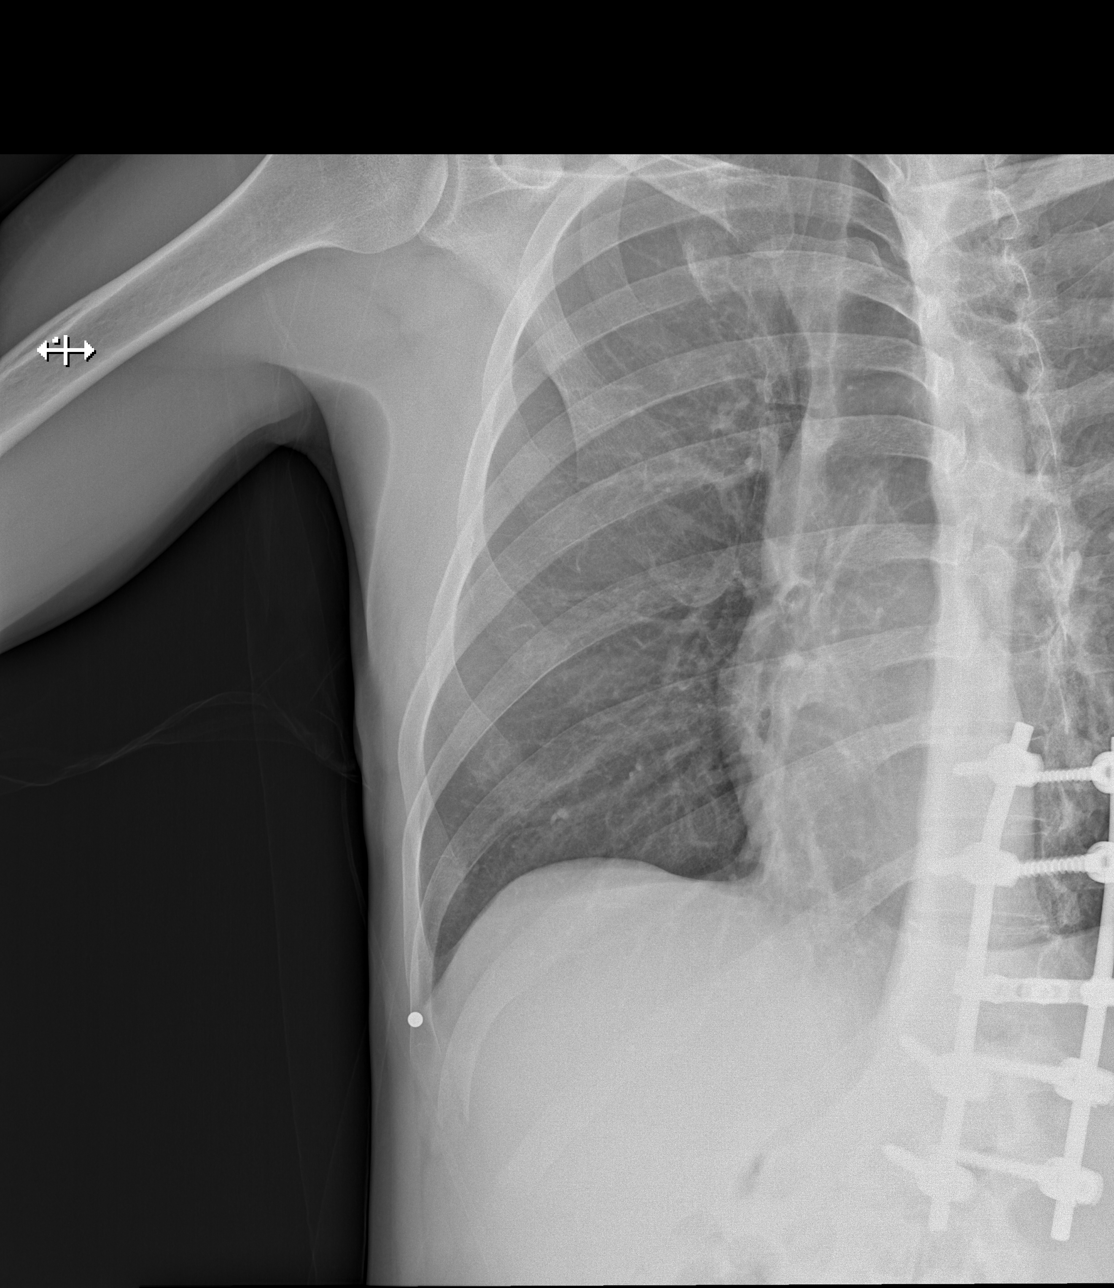

[4 of 4 positions shown; findings below may reference images not displayed]

FINDINGS: Mediastinum and hilar structures normal. The lungs are clear.
Bilateral nipple shadows noted. Heart size normal. No acute bony
abnormality identified. Left posterior eighth old rib fracture noted
. Thoracolumbar spine fusion. Hardware intact.
IMPRESSION: No acute abnormality. No evidence of displaced rib fracture or
pneumothorax.

## 2017-09-17 IMAGING — CR DG LUMBAR SPINE COMPLETE 4+V
5 series · 5 of 5 positions shown · non-contrast
Comparison: MRI lumbar spine 05/28/2016

CLINICAL DATA: Tripped while walking down the street, RIGHT lower
back pain

EXAM:
LUMBAR SPINE - COMPLETE 4+ VIEW

[t lumbar spine ap]
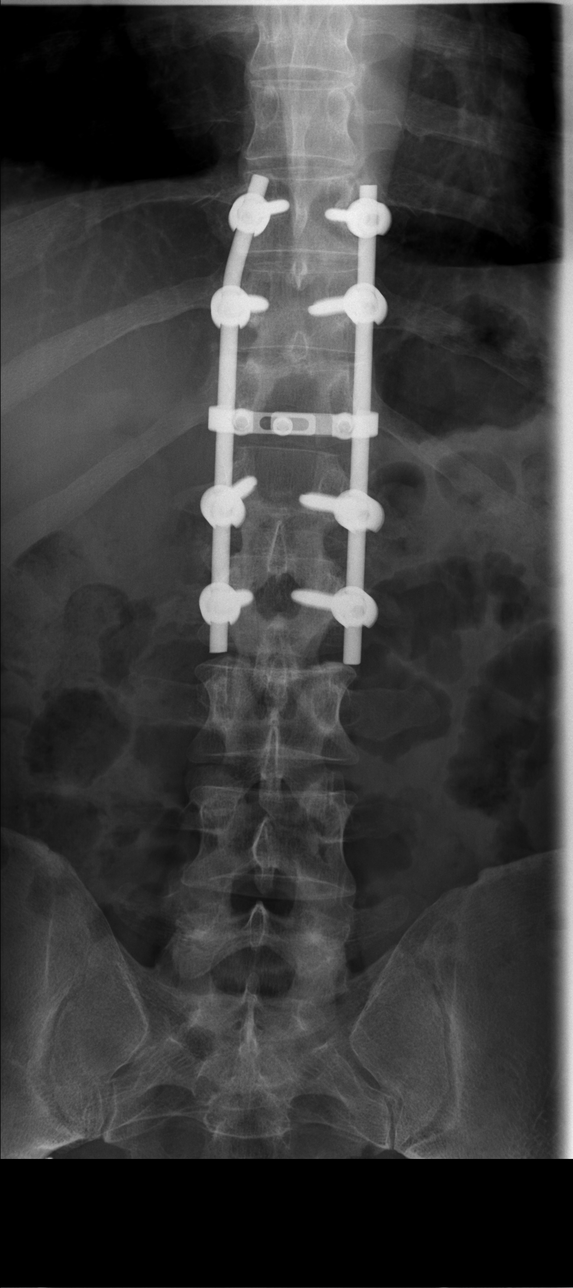

[t lumbar spine obl (1 of 2)]
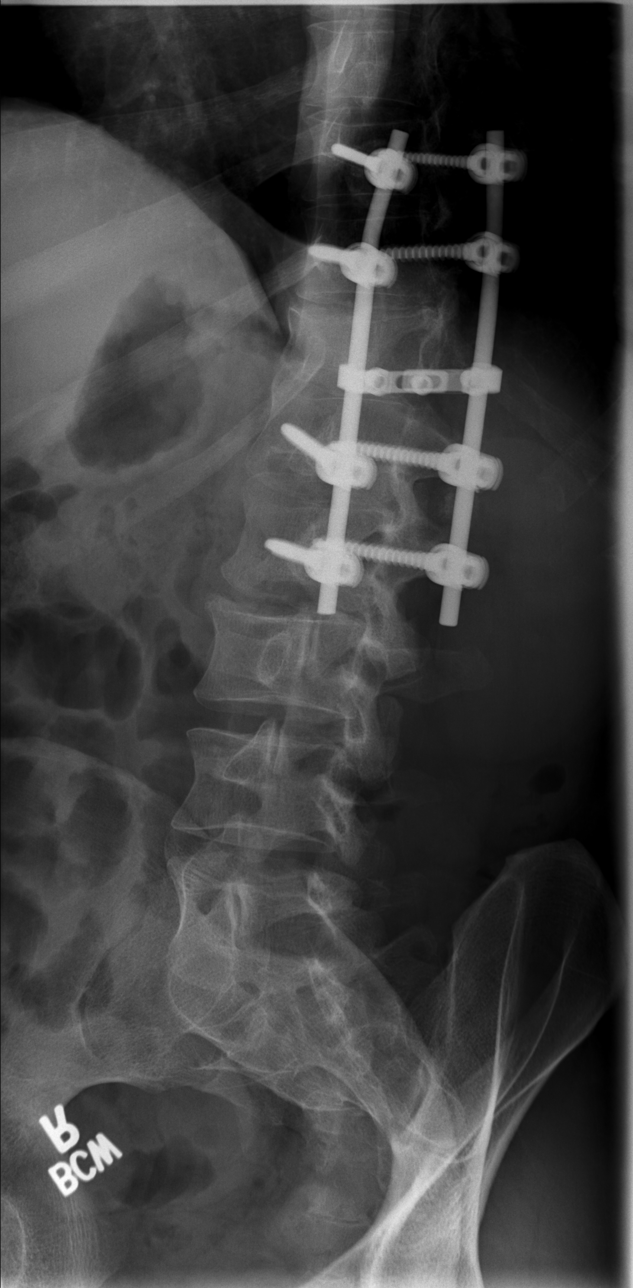

[t lumbar spine obl (2 of 2)]
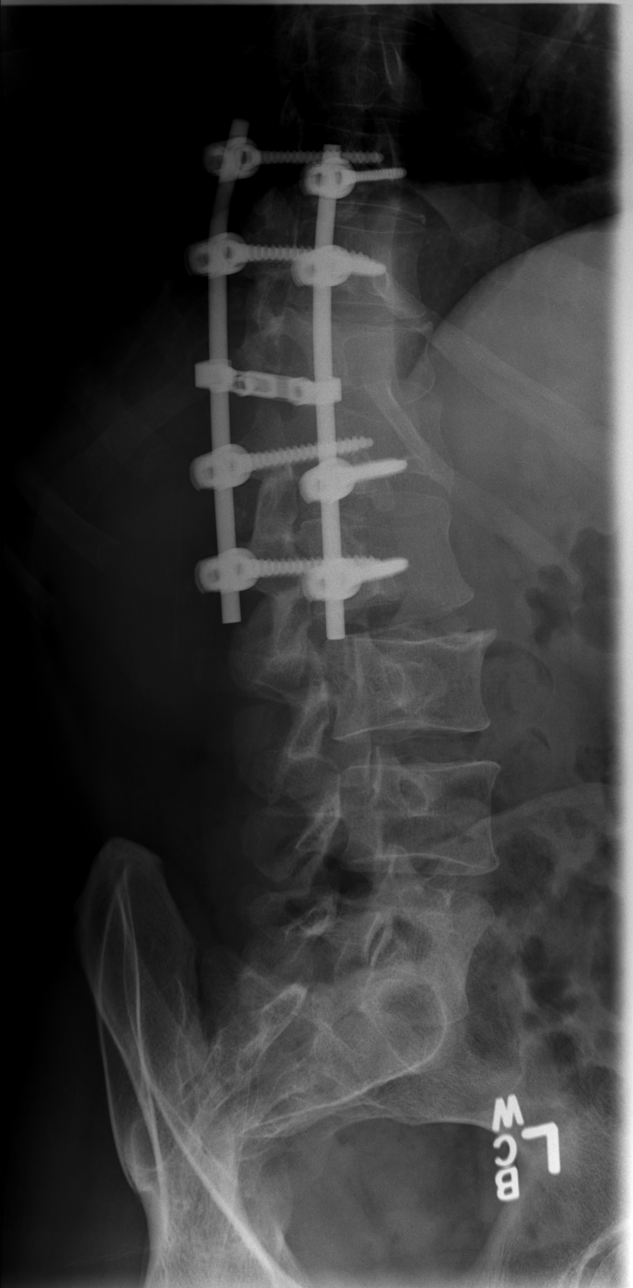

[t lumbar spine lat]
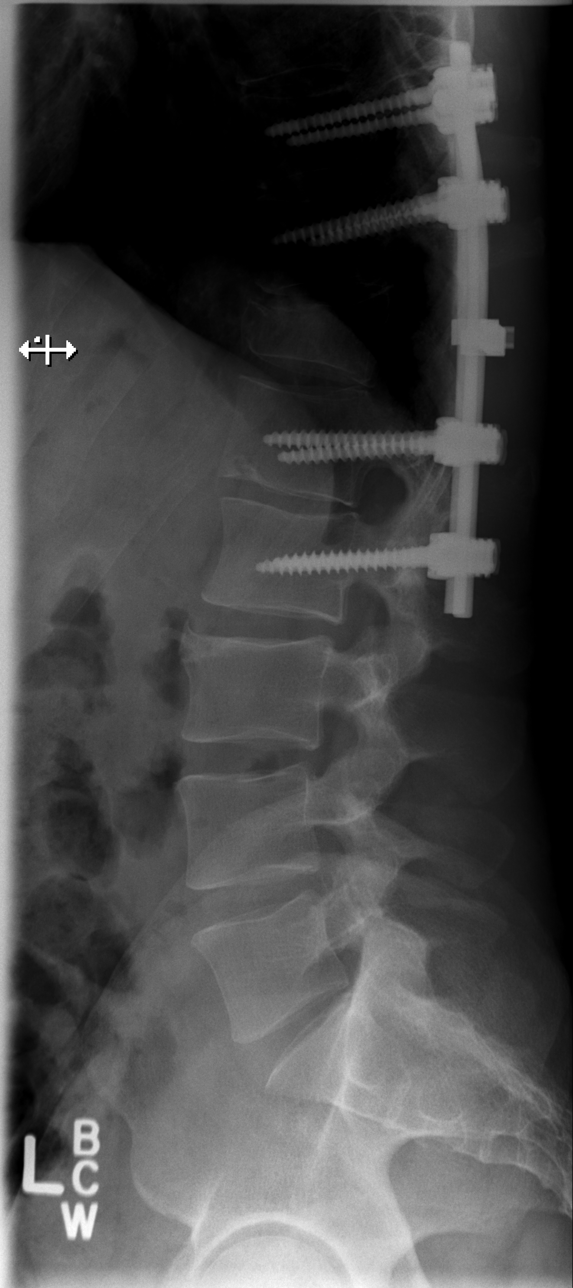

[t lumbar l-5 s-1 spot]
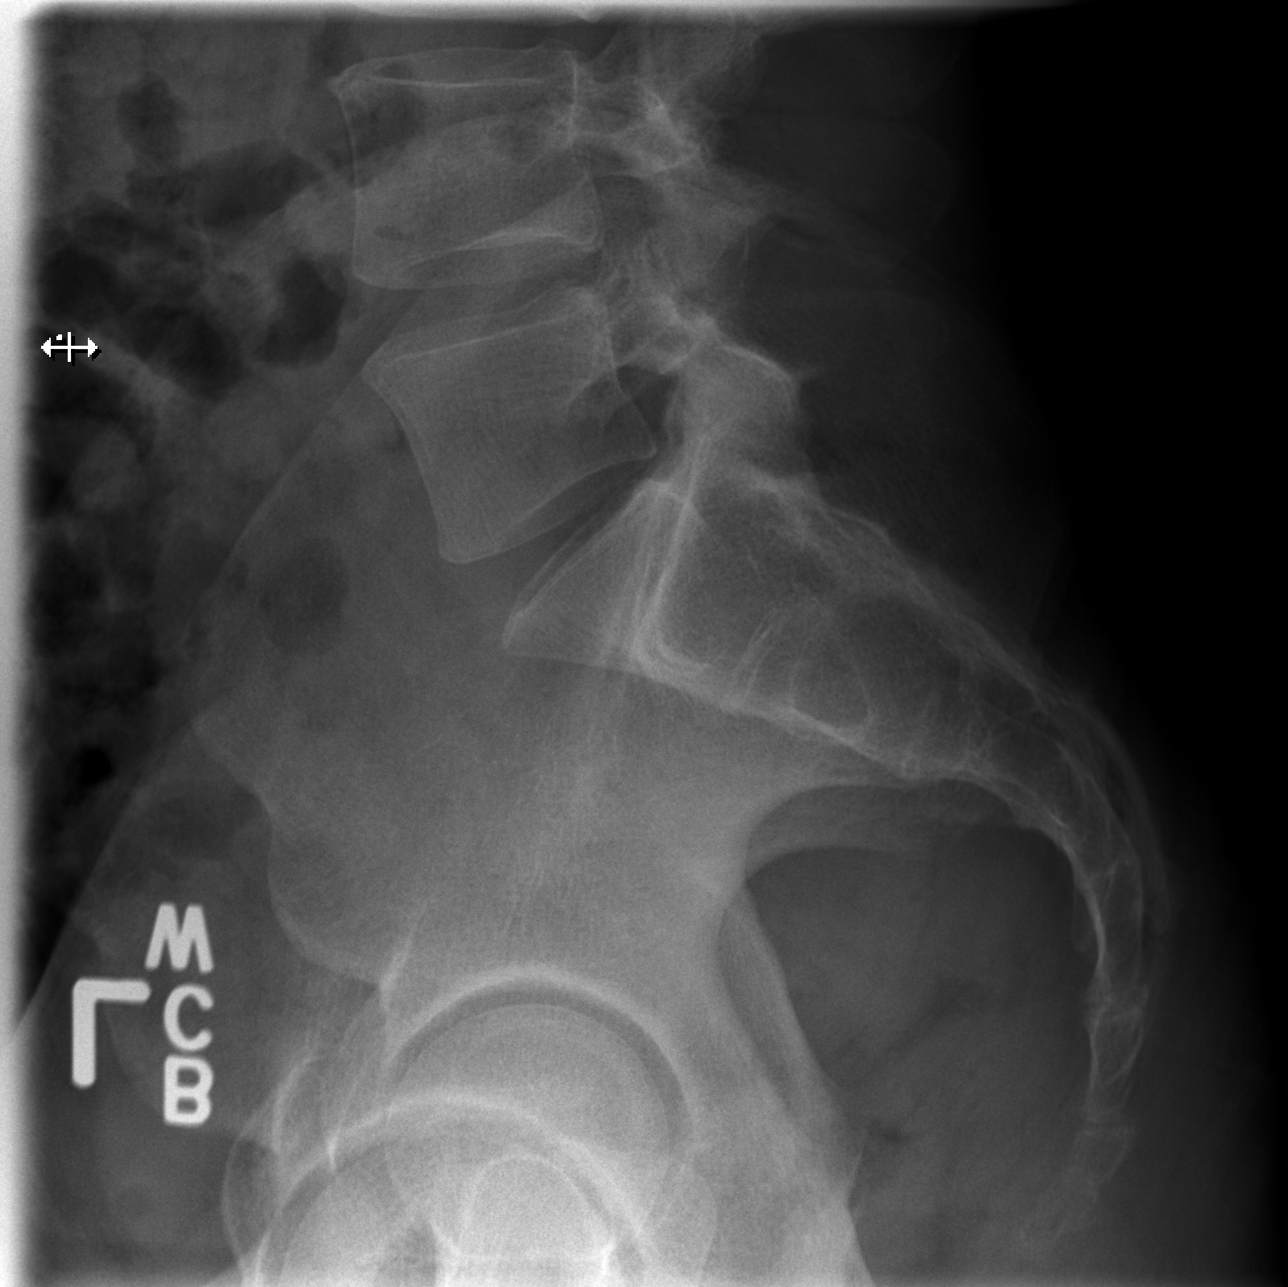

[5 of 5 positions shown; findings below may reference images not displayed]

FINDINGS: Five non-rib-bearing lumbar vertebra.

Prior thoracolumbar spinal fixation T10-L2.

Old T12 compression fracture.

Vertebral body heights otherwise maintained without fracture or
subluxation.

No spondylolysis.

Endplate spur formation superior endplate L3.

Hardware appears intact.

SI joints symmetric.
IMPRESSION: Old T12 compression fracture with T10-L2 spinal fixation.

No acute bony abnormalities.

## 2019-10-05 ENCOUNTER — Encounter: Payer: Self-pay | Admitting: Dermatology
